# Patient Record
Sex: Male | Born: 1935 | Race: Black or African American | Hispanic: No | Marital: Married | Smoking: Former smoker
Health system: Southern US, Community
[De-identification: ages and names within clinical notes are randomized; demographics above are authoritative.]

## PROBLEM LIST (undated history)

## (undated) DIAGNOSIS — I714 Abdominal aortic aneurysm, without rupture, unspecified: Secondary | ICD-10-CM

## (undated) DIAGNOSIS — I10 Essential (primary) hypertension: Secondary | ICD-10-CM

## (undated) DIAGNOSIS — I639 Cerebral infarction, unspecified: Secondary | ICD-10-CM

## (undated) HISTORY — PX: CATARACT EXTRACTION, BILATERAL: SHX1313

## (undated) HISTORY — PX: DENTAL SURGERY: SHX609

---

## 2015-01-30 HISTORY — PX: REFRACTIVE SURGERY: SHX103

## 2017-11-30 LAB — BASIC METABOLIC PANEL
BUN: 26 — AB (ref 4–21)
Creatinine: 1.4 — AB (ref 0.6–1.3)
Glucose: 83
Sodium: 142 (ref 137–147)

## 2017-11-30 LAB — LIPID PANEL
Cholesterol: 142 (ref 0–200)
HDL: 50 (ref 35–70)
LDL Cholesterol: 82
Triglycerides: 35 — AB (ref 40–160)

## 2017-11-30 LAB — HEPATIC FUNCTION PANEL: Bilirubin, Total: 0.7

## 2017-11-30 LAB — HEMOGLOBIN A1C: Hemoglobin A1C: 5.7

## 2018-08-02 ENCOUNTER — Emergency Department (HOSPITAL_COMMUNITY): Payer: Medicare Other

## 2018-08-02 ENCOUNTER — Other Ambulatory Visit: Payer: Self-pay

## 2018-08-02 ENCOUNTER — Encounter (HOSPITAL_COMMUNITY): Payer: Self-pay

## 2018-08-02 ENCOUNTER — Emergency Department (HOSPITAL_COMMUNITY)
Admission: EM | Admit: 2018-08-02 | Discharge: 2018-08-02 | Disposition: A | Discharge status: Home / Self Care | Payer: Medicare Other | Attending: Emergency Medicine | Admitting: Emergency Medicine

## 2018-08-02 DIAGNOSIS — R5381 Other malaise: Secondary | ICD-10-CM | POA: Diagnosis not present

## 2018-08-02 DIAGNOSIS — Z8673 Personal history of transient ischemic attack (TIA), and cerebral infarction without residual deficits: Secondary | ICD-10-CM | POA: Insufficient documentation

## 2018-08-02 DIAGNOSIS — Z7901 Long term (current) use of anticoagulants: Secondary | ICD-10-CM | POA: Diagnosis not present

## 2018-08-02 DIAGNOSIS — R0602 Shortness of breath: Secondary | ICD-10-CM | POA: Diagnosis present

## 2018-08-02 DIAGNOSIS — I10 Essential (primary) hypertension: Secondary | ICD-10-CM | POA: Insufficient documentation

## 2018-08-02 DIAGNOSIS — Z79899 Other long term (current) drug therapy: Secondary | ICD-10-CM | POA: Diagnosis not present

## 2018-08-02 HISTORY — DX: Essential (primary) hypertension: I10

## 2018-08-02 HISTORY — DX: Abdominal aortic aneurysm, without rupture: I71.4

## 2018-08-02 HISTORY — DX: Abdominal aortic aneurysm, without rupture, unspecified: I71.40

## 2018-08-02 HISTORY — DX: Cerebral infarction, unspecified: I63.9

## 2018-08-02 LAB — BLOOD GAS, VENOUS
Acid-Base Excess: 2.5 mmol/L — ABNORMAL HIGH (ref 0.0–2.0)
Bicarbonate: 27.7 mmol/L (ref 20.0–28.0)
O2 SAT: 64.7 %
Patient temperature: 98.6
pCO2, Ven: 47.1 mmHg (ref 44.0–60.0)
pH, Ven: 7.387 (ref 7.250–7.430)
pO2, Ven: 35.8 mmHg (ref 32.0–45.0)

## 2018-08-02 LAB — COMPREHENSIVE METABOLIC PANEL
ALT: 11 U/L (ref 0–44)
ANION GAP: 8 (ref 5–15)
AST: 18 U/L (ref 15–41)
Albumin: 3.8 g/dL (ref 3.5–5.0)
Alkaline Phosphatase: 58 U/L (ref 38–126)
BUN: 13 mg/dL (ref 8–23)
CALCIUM: 8.9 mg/dL (ref 8.9–10.3)
CO2: 28 mmol/L (ref 22–32)
Chloride: 106 mmol/L (ref 98–111)
Creatinine, Ser: 1.18 mg/dL (ref 0.61–1.24)
GFR calc Af Amer: >60 mL/min (ref >60)
GFR calc non Af Amer: 58 mL/min — ABNORMAL LOW (ref >60)
Glucose, Bld: 90 mg/dL (ref 70–99)
Potassium: 3.9 mmol/L (ref 3.5–5.1)
Sodium: 142 mmol/L (ref 135–145)
Total Bilirubin: 1.6 mg/dL — ABNORMAL HIGH (ref 0.3–1.2)
Total Protein: 7.6 g/dL (ref 6.5–8.1)

## 2018-08-02 LAB — CBC WITH DIFFERENTIAL/PLATELET
Abs Immature Granulocytes: 0.01 10*3/uL (ref 0.00–0.07)
Basophils Absolute: 0 10*3/uL (ref 0.0–0.1)
Basophils Relative: 1 %
EOS ABS: 0.1 10*3/uL (ref 0.0–0.5)
Eosinophils Relative: 2 %
HCT: 46 % (ref 39.0–52.0)
Hemoglobin: 14.4 g/dL (ref 13.0–17.0)
Immature Granulocytes: 0 %
Lymphocytes Relative: 29 %
Lymphs Abs: 1.8 10*3/uL (ref 0.7–4.0)
MCH: 31 pg (ref 26.0–34.0)
MCHC: 31.3 g/dL (ref 30.0–36.0)
MCV: 99.1 fL (ref 80.0–100.0)
Monocytes Absolute: 0.5 10*3/uL (ref 0.1–1.0)
Monocytes Relative: 9 %
NEUTROS PCT: 59 %
Neutro Abs: 3.6 10*3/uL (ref 1.7–7.7)
Platelets: 127 10*3/uL — ABNORMAL LOW (ref 150–400)
RBC: 4.64 MIL/uL (ref 4.22–5.81)
RDW: 14.3 % (ref 11.5–15.5)
WBC: 6.1 10*3/uL (ref 4.0–10.5)
nRBC: 0 % (ref 0.0–0.2)

## 2018-08-02 LAB — URINALYSIS, ROUTINE W REFLEX MICROSCOPIC
Bilirubin Urine: NEGATIVE
Glucose, UA: NEGATIVE mg/dL
Hgb urine dipstick: NEGATIVE
Ketones, ur: NEGATIVE mg/dL
Leukocytes, UA: NEGATIVE
Nitrite: NEGATIVE
Protein, ur: NEGATIVE mg/dL
Specific Gravity, Urine: 1.011 (ref 1.005–1.030)
pH: 8 (ref 5.0–8.0)

## 2018-08-02 MED ORDER — LORAZEPAM 0.5 MG PO TABS: 0.5000 mg | ORAL_TABLET | Freq: Once | ORAL | Status: DC

## 2018-08-02 NOTE — ED Provider Notes (Signed)
[facility]-EMERGENCY DEPT Provider Note   CSN: [id number] Arrival date & time: 08/02/18  1028     History   Chief Complaint No chief complaint on file.   HPI Steven Kent is a 84 y.o. male.  HPI   He presents for evaluation of shortness of breath which was first noticed this morning.  He also felt like he had more saliva in his mouth than usual.  He is relatively new to [place] does not have a local doctor here.  He has had chronic ongoing excess saliva since his stroke many years ago.  He does not have a swallowing disorder that he knows about.  He intermittently uses a patch on his right eye because he has blurred vision there, following the stroke.  There is been no recent fever, cough, change in chronic right-sided weakness or new paresthesia.  He is here with family members.  They state that he has early dementia.  There are no other known modifying factors.  Past Medical History:  Diagnosis Date  . Abdominal aortic aneurysm (AAA) (HCC)   . Hypertension   . Stroke [facility](HCC)     There are no active problems to display for this patient.   History reviewed. No pertinent surgical history.      Home Medications    Prior to Admission medications   Medication Sig Start Date End Date Taking? Authorizing Provider  allopurinol (ZYLOPRIM) 100 MG tablet Take 100 mg by mouth 2 (two) times daily.   Yes [provider]  amLODipine (NORVASC) 2.5 MG tablet Take 2.5 mg by mouth every evening.   Yes [provider]  atorvastatin (LIPITOR) 40 MG tablet Take 40 mg by mouth every evening.   Yes [provider]  brimonidine (ALPHAGAN) 0.15 % ophthalmic solution Place 1 drop into both eyes daily.   Yes [provider]  dorzolamide (TRUSOPT) 2 % ophthalmic solution Place 1 drop into both eyes 2 (two) times daily.   Yes [provider]  famotidine (PEPCID) 20 MG tablet Take 20 mg by mouth daily.   Yes [provider]   gabapentin (NEURONTIN) 400 MG capsule Take 400 mg by mouth 2 (two) times daily.   Yes [provider]  latanoprost (XALATAN) 0.005 % ophthalmic solution Place 1 drop into both eyes at bedtime.   Yes [provider]  oxybutynin (DITROPAN) 5 MG tablet Take 5 mg by mouth 2 (two) times daily.   Yes [provider]  rivaroxaban (XARELTO) 20 MG TABS tablet Take 20 mg by mouth every evening.   Yes [provider]  torsemide (DEMADEX) 5 MG tablet Take 5 mg by mouth every evening.   Yes [provider]  Vitamin D, Ergocalciferol, (DRISDOL) 1.25 MG (50000 UT) CAPS capsule Take 50,000 Units by mouth every 7 (seven) days.   Yes [provider]    Family History History reviewed. No pertinent family history.  Social History Social History   Tobacco Use  . Smoking status: Not on file  Substance Use Topics  . Alcohol use: Never    Frequency: Never  . Drug use: Never     Allergies   Aspirin   Review of Systems Review of Systems  All other systems reviewed and are negative.    Physical Exam Updated Vital Signs BP (!) 143/83   Pulse 62   Temp 98.8 F (37.1 C) (Oral)   Resp 11   SpO2 99%   Physical Exam Vitals signs and nursing  note reviewed.  Constitutional:      General: He is not in acute distress.    Appearance: He is well-developed. He is not ill-appearing or diaphoretic.     Comments: Elderly, frail  HENT:     Head: Normocephalic and atraumatic.     Right Ear: External ear normal.     Left Ear: External ear normal.     Nose: Nose normal. No congestion or rhinorrhea.     Mouth/Throat:     Mouth: Mucous membranes are moist.     Pharynx: No oropharyngeal exudate or posterior oropharyngeal erythema.  Eyes:     Conjunctiva/sclera: Conjunctivae normal.     Pupils: Pupils are equal, round, and reactive to light.  Neck:     Musculoskeletal: Normal range of motion and neck supple.     Trachea: Phonation normal.    Cardiovascular:     Rate and Rhythm: Normal rate and regular rhythm.     Heart sounds: Normal heart sounds.  Pulmonary:     Effort: Pulmonary effort is normal. No respiratory distress.     Breath sounds: Normal breath sounds. No stridor. No rhonchi.  Abdominal:     General: There is no distension.     Palpations: Abdomen is soft.     Tenderness: There is no abdominal tenderness.     Hernia: No hernia is present.  Musculoskeletal:     Comments: Right arm and leg weakness, mild.  Right lower leg edema, from chronic disuse.  Family members state that this is unchanged.  Skin:    General: Skin is warm and dry.     Coloration: Skin is not pale.     Findings: No erythema.  Neurological:     Mental Status: He is alert and oriented to person, place, and time.     Cranial Nerves: No cranial nerve deficit.     Sensory: No sensory deficit.     Motor: No abnormal muscle tone.     Coordination: Coordination normal.     Comments: No dysarthria or aphasia.  Psychiatric:        Behavior: Behavior normal.        Thought Content: Thought content normal.        Judgment: Judgment normal.      ED Treatments / Results  Labs (all labs ordered are listed, but only abnormal results are displayed) Labs Reviewed  COMPREHENSIVE METABOLIC PANEL - Abnormal; Notable for the following components:      Result Value   Total Bilirubin 1.6 (*)    GFR calc non Af Amer 58 (*)    All other components within normal limits  CBC WITH DIFFERENTIAL/PLATELET - Abnormal; Notable for the following components:   Platelets 127 (*)    All other components within normal limits  URINALYSIS, ROUTINE W REFLEX MICROSCOPIC - Abnormal; Notable for the following components:   APPearance HAZY (*)    All other components within normal limits  BLOOD GAS, VENOUS - Abnormal; Notable for the following components:   Acid-Base Excess 2.5 (*)    All other components within normal limits    EKG EKG  Interpretation  Date/Time:  Thursday August 02 2018 12:42:18 EST Ventricular Rate:  71 PR Interval:    QRS Duration: 104 QT Interval:  440 QTC Calculation: 479 R Axis:   -23 Text Interpretation:  Sinus rhythm Atrial premature complexes Prolonged PR interval Borderline left axis deviation Borderline T wave abnormalities Borderline prolonged QT interval Artifact No previous ECGs available Confirmed by  Mancel BaleWentz, Meilani Edmundson [phone]([id number]) on 08/02/2018 2:23:37 PM   Radiology Dg Chest 2 View  Result Date: 08/02/2018 CLINICAL DATA:  Shortness of Breath EXAM: CHEST - 2 VIEW COMPARISON:  None. FINDINGS: Cardiomegaly. No confluent airspace opacities or effusions. No acute bony abnormality. IMPRESSION: Cardiomegaly.  No active disease. Electronically Signed   By: Charlett NoseKevin  Dover M.D.   On: 08/02/2018 12:25    Procedures Procedures (including critical care time)  Medications Ordered in ED Medications - No data to display   Initial Impression / Assessment and Plan / ED Course  I have reviewed the triage vital signs and the nursing notes.  Pertinent labs & imaging results that were available during my care of the patient were reviewed by me and considered in my medical decision making (see chart for details).  Clinical Course as of Aug 02 1437  Thu Aug 02, 2018  1421 Normal  Urinalysis, Routine w reflex microscopic(!) [EW]  1422 Normal except platelets low  CBC with Differential(!) [EW]  1422 Normal  Comprehensive metabolic panel(!) [EW]  1422 Normal  Blood gas, venous(!) [EW]  1423 No infiltrate or CHF images reviewed by me   [EW]    Clinical Course User Index [EW] Mancel BaleWentz, Julius Boniface, MD     Patient Vitals for the past 24 hrs:  BP Temp Temp src Pulse Resp SpO2  08/02/18 1330 (!) 143/83 - - 62 11 99 %  08/02/18 1300 (!) 147/88 - - 62 16 100 %  08/02/18 1200 (!) 143/81 - - (!) 53 - 100 %  08/02/18 1100 130/82 - - (!) 56 - 100 %  08/02/18 1058 (!) 150/82 98.8 F (37.1 C) Oral 63 18 100 %    2:25  PM Reevaluation with update and discussion. After initial assessment and treatment, an updated evaluation reveals no change in clinical status, he remains comfortable and alert.  Findings discussed with the patient and his daughter, all questions answered. Mancel BaleElliott Shanikia [employer]   Medical Decision Making: Nonspecific symptoms, with chronic illnesses.  Screening evaluation is reassuring.  Doubt serious bacterial infection, metabolic instability, pneumonia, ACS or PE.  CRITICAL CARE-no Performed by: Mancel BaleElliott Venita Seng  Nursing Notes Reviewed/ Care Coordinated Applicable Imaging Reviewed Interpretation of Laboratory Data incorporated into ED treatment  The patient appears reasonably screened and/or stabilized for discharge and I doubt any other medical condition or other [facility] requiring further screening, evaluation, or treatment in the ED at this time prior to discharge.  Plan: Home Medications-continue usual medications; Home Treatments-rest, fluids; return here if the recommended treatment, does not improve the symptoms; Recommended follow up-PCP of choice for follow-up care as needed   Final Clinical Impressions(s) / ED Diagnoses   [facility]    ED Discharge Orders    None       Mancel BaleWentz, Lenox Bink, MD 08/02/18 1439

## 2018-08-02 NOTE — ED Triage Notes (Addendum)
Patient BIB EMS from home with complaints of "I dont feel good." EMS states patient does not specify what does not feel good. Patient reports normal PO intake. Denies nausea/vomiting/diarrhea/CP/weakness/SOB. Patient reports excessive saliva. Patient VSS. Patient has history of stroke with residual right sided weakness. Patient's wife told EMS that she thought "he has some sort of aneurysm in his belly." EMS VS: 150/88, 70 SR, 16 RR, 98% RA, CBG=101

## 2018-08-02 NOTE — ED Notes (Signed)
Bed: [id number] Expected date:  Expected time:  Means of arrival:  Comments: EMS-weak/excessivw salvia

## 2018-08-02 NOTE — Discharge Instructions (Addendum)
Make sure that he is eating and drinking regularly, and taking all of his medicines.  Use the resource guide to help you find a doctor to see for ongoing management, prescriptions and interventions as needed.

## 2018-08-02 NOTE — ED Triage Notes (Signed)
Per EMS, patient takes Xarelto, Gabapentin, Famotidine, Atorvastatin, Amlodipine, Allopurinol, Latanoprost. Patient wife to be bringing list to the ED.

## 2018-08-16 ENCOUNTER — Encounter: Payer: Self-pay | Admitting: Family Medicine

## 2018-08-16 ENCOUNTER — Ambulatory Visit (INDEPENDENT_AMBULATORY_CARE_PROVIDER_SITE_OTHER): Payer: Medicare Other | Admitting: Family Medicine

## 2018-08-16 VITALS — BP 134/84 | HR 62 | Temp 98.6°F | Ht 73.5 in | Wt 194.8 lb

## 2018-08-16 DIAGNOSIS — B351 Tinea unguium: Secondary | ICD-10-CM

## 2018-08-16 DIAGNOSIS — I714 Abdominal aortic aneurysm, without rupture, unspecified: Secondary | ICD-10-CM | POA: Insufficient documentation

## 2018-08-16 DIAGNOSIS — E785 Hyperlipidemia, unspecified: Secondary | ICD-10-CM | POA: Insufficient documentation

## 2018-08-16 DIAGNOSIS — I2510 Atherosclerotic heart disease of native coronary artery without angina pectoris: Secondary | ICD-10-CM | POA: Insufficient documentation

## 2018-08-16 DIAGNOSIS — R600 Localized edema: Secondary | ICD-10-CM

## 2018-08-16 DIAGNOSIS — K219 Gastro-esophageal reflux disease without esophagitis: Secondary | ICD-10-CM

## 2018-08-16 DIAGNOSIS — M109 Gout, unspecified: Secondary | ICD-10-CM | POA: Diagnosis not present

## 2018-08-16 DIAGNOSIS — H409 Unspecified glaucoma: Secondary | ICD-10-CM

## 2018-08-16 DIAGNOSIS — I8290 Acute embolism and thrombosis of unspecified vein: Secondary | ICD-10-CM | POA: Insufficient documentation

## 2018-08-16 DIAGNOSIS — I10 Essential (primary) hypertension: Secondary | ICD-10-CM | POA: Diagnosis not present

## 2018-08-16 DIAGNOSIS — I129 Hypertensive chronic kidney disease with stage 1 through stage 4 chronic kidney disease, or unspecified chronic kidney disease: Secondary | ICD-10-CM | POA: Insufficient documentation

## 2018-08-16 DIAGNOSIS — I69351 Hemiplegia and hemiparesis following cerebral infarction affecting right dominant side: Secondary | ICD-10-CM

## 2018-08-16 DIAGNOSIS — N183 Chronic kidney disease, stage 3 unspecified: Secondary | ICD-10-CM

## 2018-08-16 DIAGNOSIS — G629 Polyneuropathy, unspecified: Secondary | ICD-10-CM | POA: Insufficient documentation

## 2018-08-16 DIAGNOSIS — G6289 Other specified polyneuropathies: Secondary | ICD-10-CM

## 2018-08-16 DIAGNOSIS — N3281 Overactive bladder: Secondary | ICD-10-CM

## 2018-08-16 MED ORDER — GABAPENTIN 400 MG PO CAPS: 400.0000 mg | ORAL_CAPSULE | Freq: Two times a day (BID) | ORAL | 3 refills | Status: DC

## 2018-08-16 MED ORDER — AMLODIPINE BESYLATE 2.5 MG PO TABS: 2.5000 mg | ORAL_TABLET | Freq: Every evening | ORAL | 3 refills | Status: DC

## 2018-08-16 MED ORDER — ATORVASTATIN CALCIUM 40 MG PO TABS: 40.0000 mg | ORAL_TABLET | Freq: Every evening | ORAL | 3 refills | Status: DC

## 2018-08-16 MED ORDER — OXYBUTYNIN CHLORIDE 5 MG PO TABS: 5.0000 mg | ORAL_TABLET | Freq: Two times a day (BID) | ORAL | 3 refills | Status: DC

## 2018-08-16 MED ORDER — TORSEMIDE 5 MG PO TABS: 5.0000 mg | ORAL_TABLET | Freq: Every evening | ORAL | 3 refills | Status: DC

## 2018-08-16 MED ORDER — ALLOPURINOL 100 MG PO TABS: 100.0000 mg | ORAL_TABLET | Freq: Two times a day (BID) | ORAL | 3 refills | Status: DC

## 2018-08-16 MED ORDER — RIVAROXABAN 20 MG PO TABS: 20.0000 mg | ORAL_TABLET | Freq: Every evening | ORAL | 3 refills | Status: DC

## 2018-08-16 NOTE — Assessment & Plan Note (Signed)
Referral placed ophthalmology 

## 2018-08-16 NOTE — Patient Instructions (Addendum)
It was very nice to see you today!  No medication changes today.  We will place referrals to vascular, cardiology, podiatry, nephrology, and ophthalmology.  I will refill your medications.  We will get your records.  Please come back to see me in a few months for your physical with blood work.  Take care, Dr [name]

## 2018-08-16 NOTE — Assessment & Plan Note (Signed)
Stable.  Continue Pepcid 20mg daily

## 2018-08-16 NOTE — Assessment & Plan Note (Signed)
Referral placed cardiology.

## 2018-08-16 NOTE — Assessment & Plan Note (Signed)
Continue Lipitor 40 mg daily.  He will follow-up soon for CPE with blood work.  Will obtain records from previous PCP.

## 2018-08-16 NOTE — Assessment & Plan Note (Signed)
Stable.  Continue allopurinol 100 mg twice daily.  Obtain records from previous PCP.  Consider stopping the future as he has not had a gout flare that he can remember.

## 2018-08-16 NOTE — Assessment & Plan Note (Signed)
Stable.  Continue Xarelto 20mg daily

## 2018-08-16 NOTE — Assessment & Plan Note (Addendum)
Secondary to history of CVA. Continue gabapentin 400 mg twice daily.

## 2018-08-16 NOTE — Assessment & Plan Note (Signed)
Continue oxybutynin 5 mg twice daily.

## 2018-08-16 NOTE — Progress Notes (Addendum)
Subjective:  Steven Kent is a 84 y.o. male who presents today with a chief complaint of AAA and to establish care.  HPI:  Patient recently located to [place] from near [place].  He requests refill on his chronic medications and referral to specialists that he was seeing in [place].  He has no acute complaints today.  His stable, chronic medical conditions are outlined below:   # Gout - On allopurinol 100mg  bid and tolerating well.  # HTN - On norvasc 2.5mg  daily and tolerating well.  # AAA -Diagnosed while in [place].  Has followed with vascular surgery.  Needs a referral to see a new vascular surgeon in [place].  # History of recurrent VTE - Currently on Xarelto 20 mg daily.  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  #Glaucoma -Follows with ophthalmology.  Needs referral to see a new ophthalmologist in [name].  # CKD3 -Needs referral for new nephrologist in [place].  # Onychomycosis -Several year history.  Needs referral for podiatrist in [place].  # Chronic LE Edema - Takes torsemide 5mg  every evening  # GERD - On pepcid 20mg  daily and tolerating well.  # HLD - On lipitor 40mg  daily and tolerating well.  # Overactive Bladder - On oxybutynin 5mg  bid and tolerating well.  # History of Stroke in 2007 with residual right side weakness -Symptoms are stable.  Able perform ADLs independently.  ROS: Per HPI, otherwise a complete review of systems was negative.   PMH:  The following were reviewed and entered/updated in epic: Past Medical History:  Diagnosis Date  . Abdominal aortic aneurysm (AAA) (HCC)   . Hypertension   . Stroke [facility])    Patient Active Problem List   Diagnosis Date Noted  . Gout 08/16/2018  . Essential hypertension 08/16/2018  . Hypertensive kidney disease with chronic kidney disease stage III (HCC) 08/16/2018  . VTE (venous thromboembolism) 08/16/2018  . Peripheral  neuropathy 08/16/2018  . Glaucoma 08/16/2018  . Onychomycosis 08/16/2018  . Leg edema 08/16/2018  . Gastroesophageal reflux disease 08/16/2018  . Hyperlipidemia 08/16/2018  . Overactive bladder 08/16/2018  . Coronary artery disease involving native heart without angina pectoris 08/16/2018  . Hemiparesis affecting right side as late effect of cerebrovascular accident (HCC)   . Abdominal aortic aneurysm (AAA) (HCC)    History reviewed. No pertinent surgical history.  Family History  Problem Relation Age of Onset  . Prostate cancer Neg Hx     Medications- reviewed and updated Current Outpatient Medications  Medication Sig Dispense Refill  . allopurinol (ZYLOPRIM) 100 MG tablet Take 1 tablet (100 mg total) by mouth 2 (two) times daily. 180 tablet 3  . amLODipine (NORVASC) 2.5 MG tablet Take 1 tablet (2.5 mg total) by mouth every evening. 90 tablet 3  . atorvastatin (LIPITOR) 40 MG tablet Take 1 tablet (40 mg total) by mouth every evening. 90 tablet 3  . brimonidine (ALPHAGAN) 0.15 % ophthalmic solution Place 1 drop into both eyes daily.    . dorzolamide (TRUSOPT) 2 % ophthalmic solution Place 1 drop into both eyes 2 (two) times daily.    Marland Kitchen gabapentin (NEURONTIN) 400 MG capsule Take 1 capsule (400 mg total) by mouth 2 (two) times daily. 180 capsule 3  . latanoprost (XALATAN) 0.005 % ophthalmic solution Place 1 drop into both eyes at bedtime.    Marland Kitchen oxybutynin (DITROPAN) 5 MG tablet Take 1 tablet (5 mg total) by mouth 2 (two) times daily.  180 tablet 3  . rivaroxaban (XARELTO) 20 MG TABS tablet Take 1 tablet (20 mg total) by mouth every evening. 90 tablet 3  . torsemide (DEMADEX) 5 MG tablet Take 1 tablet (5 mg total) by mouth every evening. 90 tablet 3  . famotidine (PEPCID) 20 MG tablet Take 1 tablet (20 mg total) by mouth daily. 60 tablet 11  . Vitamin D, Ergocalciferol, (DRISDOL) 1.25 MG (50000 UT) CAPS capsule Take 1 capsule (50,000 Units total) by mouth every 7 (seven) days. 12 capsule 3    No current facility-administered medications for this visit.     Allergies-reviewed and updated Allergies  Allergen Reactions  . Aspirin     Full Strength Asprin-325 mg causes upset stomach. Can take 81 mg.    Social History   Socioeconomic History  . Marital status: Married    Spouse name: Not on file  . Number of children: Not on file  . Years of education: Not on file  . Highest education level: Not on file  Occupational History  . Not on file  Social Needs  . Financial resource strain: Not on file  . Food insecurity:    Worry: Not on file    Inability: Not on file  . Transportation needs:    Medical: Not on file    Non-medical: Not on file  Tobacco Use  . Smoking status: Former Games developer  . Smokeless tobacco: Never Used  Substance and Sexual Activity  . Alcohol use: Never    Frequency: Never  . Drug use: Never  . Sexual activity: Not on file  Lifestyle  . Physical activity:    Days per week: Not on file    Minutes per session: Not on file  . Stress: Not on file  Relationships  . Social connections:    Talks on phone: Not on file    Gets together: Not on file    Attends religious service: Not on file    Active member of club or organization: Not on file    Attends meetings of clubs or organizations: Not on file    Relationship status: Not on file  Other Topics Concern  . Not on file  Social History Narrative  . Not on file     Objective:  Physical Exam: BP 134/84 (BP Location: Left Arm, Patient Position: Sitting, Cuff Size: Normal)   Pulse 62   Temp 98.6 F (37 C) (Oral)   Ht 6' 1.5" (1.867 m)   Wt 194 lb 12.8 oz (88.4 kg)   SpO2 96%   BMI 25.35 kg/m   Gen: NAD, resting comfortably CV: RRR with no murmurs appreciated Pulm: NWOB, CTAB with no crackles, wheezes, or rhonchi GI: Normal bowel sounds present. Soft, Nontender, Nondistended. MSK: No edema, cyanosis, or clubbing noted Skin: Warm, dry Neuro: Residual right-sided weakness, otherwise  grossly normal neuro exam. Psych: Normal affect and thought content  Assessment/Plan:  VTE (venous thromboembolism) Stable.  Continue Xarelto 20 mg daily.  Overactive bladder Continue oxybutynin 5 mg twice daily.  Peripheral neuropathy Secondary to history of CVA. Continue gabapentin 400 mg twice daily.  Onychomycosis Referral placed to podiatry.  Leg edema Stable.  Continue torsemide 5 mg daily.  Hyperlipidemia Continue Lipitor 40 mg daily.  He will follow-up soon for CPE with blood work.  Will obtain records from previous PCP.  Gout Stable.  Continue allopurinol 100 mg twice daily.  Obtain records from previous PCP.  Consider stopping the future as he has not had a gout  flare that he can remember.  Glaucoma Referral placed ophthalmology.  Gastroesophageal reflux disease Stable.  Continue Pepcid 20 mg daily.  Essential hypertension At goal.  Continue Norvasc 2.5 mg daily.  Coronary artery disease involving native heart without angina pectoris Referral placed cardiology.  Hypertensive kidney disease with chronic kidney disease stage III (HCC) BMET while in emergency department a few days ago was stable.  Will place referral to nephrology.    Abdominal aortic aneurysm (AAA) (HCC) Refer to vascular surgery.  Preventative Healthcare Patient was instructed to return soon for CPE.  Will obtain records from previous PCP.  He thinks that he has had a colonoscopy within the last 10 years.  He is interested in possibly having PSA screening done. Health Maintenance Due  Topic Date Due  . TETANUS/TDAP  07/24/1954   Time Spent: I spent >60 minutes face-to-face with the patient, with more than half spent on coordinating care and counseling for management plan for his VTE, overactive bladder, peripheral neuropathy, onychomycosis, leg edema, hyperlipidemia, gout, glaucoma, GERD, hypertension, coronary artery disease, CKD, stroke, and AAA.  Katina Degreealeb M. Jimmey RalphParker, MD 08/22/2018 9:30 AM

## 2018-08-16 NOTE — Assessment & Plan Note (Signed)
At goal.  Continue Norvasc 2.5 mg daily. 

## 2018-08-16 NOTE — Assessment & Plan Note (Signed)
Stable.  Continue torsemide 5 mg daily.

## 2018-08-16 NOTE — Assessment & Plan Note (Signed)
BMET while in emergency department a few days ago was stable.  Will place referral to nephrology.

## 2018-08-16 NOTE — Assessment & Plan Note (Signed)
Referral placed to podiatry.

## 2018-08-16 NOTE — Assessment & Plan Note (Signed)
Refer to vascular surgery. 

## 2018-08-20 ENCOUNTER — Other Ambulatory Visit: Payer: Self-pay | Admitting: Family Medicine

## 2018-08-20 MED ORDER — FAMOTIDINE 20 MG PO TABS: 20.0000 mg | ORAL_TABLET | Freq: Every day | ORAL | 11 refills | Status: DC

## 2018-08-20 MED ORDER — VITAMIN D (ERGOCALCIFEROL) 1.25 MG (50000 UNIT) PO CAPS: 50000.0000 [IU] | ORAL_CAPSULE | ORAL | 3 refills | Status: DC

## 2018-08-20 NOTE — Telephone Encounter (Signed)
See note

## 2018-08-20 NOTE — Telephone Encounter (Signed)
Requested medication (s) are due for refill today: yes  Requested medication (s) are on the active medication list: yes  Last refill:  Last refilled by historical provider  Future visit scheduled: yes  Notes to clinic:  Unable to refill per protocol. Medication last filled by historical provider    Requested Prescriptions  Pending Prescriptions Disp Refills   famotidine (PEPCID) 20 MG tablet      Sig: Take 1 tablet (20 mg total) by mouth daily.     Gastroenterology:  H2 Antagonists Passed - 08/20/2018 11:09 AM      Passed - Valid encounter within last 12 months    Recent Outpatient Visits          4 days ago Gout, unspecified cause, unspecified chronicity, unspecified site   [facility], Katina Degree, MD      Future Appointments            In 2 months [name], Katina Degree, MD [facility] [place]          Vitamin D, Ergocalciferol, (DRISDOL) 1.25 MG (50000 UT) CAPS capsule      Sig: Take 1 capsule (50,000 Units total) by mouth every 7 (seven) days.     Endocrinology:  Vitamins - Vitamin D Supplementation Failed - 08/20/2018 11:09 AM      Failed - 50,000 IU strengths are not delegated      Failed - Phosphate in normal range and within 360 days    No results found for: PHOS       Failed - Vit D in normal range and within 360 days    No results found for: [id number], PO2423NT6, [id number], 25OHVITD3, 25OHVITD2, 25OHVITD3, 25OHVITD2, 25OHVITD1, 25OHVITD2, 25OHVITD3, VD25OH       Passed - Ca in normal range and within 360 days    Calcium  Date Value Ref Range Status  08/02/2018 8.9 8.9 - 10.3 mg/dL Final         Passed - Valid encounter within last 12 months    Recent Outpatient Visits          4 days ago Gout, unspecified cause, unspecified chronicity, unspecified site   [facility], Katina Degree, MD      Future Appointments            In 2 months [name], Katina Degree, MD [facility]  [place], [facility]

## 2018-08-20 NOTE — Telephone Encounter (Signed)
Copied from CRM [id number]. Topic: Quick Communication - See Telephone Encounter >> Aug 20, 2018 11:01 AM Windy Kalata, NT wrote: CRM for notification. See Telephone encounter for: 08/20/18.  Patient wife is calling and states the patient is needing refills on the following medications. Please advise.  famotidine (PEPCID) 20 MG tablet Vitamin D3 50,000 IU capsules  [id number] [place], [place] - [address] AT [facility] [address] [facility] [address] [place] [id number] Phone: [phone] Fax: [phone]

## 2018-08-20 NOTE — Telephone Encounter (Signed)
New Patient requesting refills.

## 2018-08-21 ENCOUNTER — Telehealth: Payer: Self-pay

## 2018-08-21 NOTE — Telephone Encounter (Signed)
Patient wife called wants to know if patient has to take Pepcid,unsure if this Rx is something that he needs to take. Please advise.

## 2018-08-22 NOTE — Telephone Encounter (Signed)
Cancel Call.

## 2018-09-07 ENCOUNTER — Other Ambulatory Visit: Payer: Self-pay

## 2018-09-07 DIAGNOSIS — I714 Abdominal aortic aneurysm, without rupture, unspecified: Secondary | ICD-10-CM

## 2018-09-18 ENCOUNTER — Encounter: Payer: Self-pay | Admitting: Family Medicine

## 2018-09-19 ENCOUNTER — Encounter: Payer: Self-pay | Admitting: Podiatry

## 2018-09-19 ENCOUNTER — Ambulatory Visit (INDEPENDENT_AMBULATORY_CARE_PROVIDER_SITE_OTHER): Payer: Medicare Other | Admitting: Podiatry

## 2018-09-19 VITALS — BP 153/96 | HR 66

## 2018-09-19 DIAGNOSIS — M79674 Pain in right toe(s): Secondary | ICD-10-CM | POA: Diagnosis not present

## 2018-09-19 DIAGNOSIS — G629 Polyneuropathy, unspecified: Secondary | ICD-10-CM

## 2018-09-19 DIAGNOSIS — B351 Tinea unguium: Secondary | ICD-10-CM | POA: Diagnosis not present

## 2018-09-19 DIAGNOSIS — M79675 Pain in left toe(s): Secondary | ICD-10-CM

## 2018-09-19 NOTE — Progress Notes (Signed)
This patient presents to the office with chief complaint of long thick nails.  Patient presents the office with his wife for evaluation and treatment of his feet.  He presents the office with his wife Steven Kent.  He has a history of right-sided stroke and right-sided weakness.  Patient states he is just moved from  [place] to the [place].  Patient is presently diagnosed with peripheral neuropathy for which he takes gabapentin.  He also is taking xarelto.  He presents the office for preventative foot care services.  General Appearance  Alert, conversant and in no acute stress.  Vascular  Dorsalis pedis and posterior tibial  pulses are palpable  Left foot.  Dorsalis pedis and posterior tibial pulses are absent right foot..  Capillary return is within normal limits  bilaterally. Temperature is within normal limits  bilaterally.  Neurologic  Senn-Weinstein monofilament wire test absent   bilaterally. Muscle power within normal limits left foot but decreased right foot..  Nails Thick disfigured discolored nails with subungual debris  from hallux to fifth toes bilaterally. No evidence of bacterial infection or drainage bilaterally.  Orthopedic  No limitations of motion  feet .  No crepitus or effusions noted.  No bony pathology or digital deformities noted.  Skin  normotropic skin with no porokeratosis noted bilaterally.  No signs of infections or ulcers noted.    Onychomycosis  B/L  Peripheral neuropathy  IE.  Debridement of nails  X 10.  Discussed his foot findings with this patient.  He was noted to have decreased motion and circulatory findings in his right foot.  Patient has normal motion and normal circulation in his left foot.  LOPS was found to be absent.  RTC 10 weeks for preventative foot care services.   Steven Kent DPM

## 2018-09-27 ENCOUNTER — Ambulatory Visit: Payer: Medicare Other | Admitting: Cardiovascular Disease

## 2018-10-02 ENCOUNTER — Ambulatory Visit (HOSPITAL_COMMUNITY)
Admission: RE | Admit: 2018-10-02 | Discharge: 2018-10-02 | Disposition: A | Discharge status: Home / Self Care | Payer: Medicare Other | Source: Ambulatory Visit | Attending: Vascular Surgery | Admitting: Vascular Surgery

## 2018-10-02 ENCOUNTER — Ambulatory Visit (INDEPENDENT_AMBULATORY_CARE_PROVIDER_SITE_OTHER): Payer: Medicare Other | Admitting: Vascular Surgery

## 2018-10-02 ENCOUNTER — Other Ambulatory Visit: Payer: Self-pay

## 2018-10-02 ENCOUNTER — Encounter: Payer: Self-pay | Admitting: Vascular Surgery

## 2018-10-02 VITALS — BP 118/71 | HR 59 | Temp 98.9°F | Resp 16 | Ht 73.5 in | Wt 208.0 lb

## 2018-10-02 DIAGNOSIS — I714 Abdominal aortic aneurysm, without rupture, unspecified: Secondary | ICD-10-CM

## 2018-10-02 NOTE — Progress Notes (Signed)
Vascular and Vein Specialist of [facility]  Patient name: Steven Kent MRN: [id number] DOB: 16-Nov-1934 Sex: male  REASON FOR CONSULT: Evaluation of abdominal aortic aneurysm  HPI: Steven Kent is a 84 y.o. male, who is here today for evaluation of abdominal aortic aneurysm.  He is here with his wife.  He recently moved to [place] from [place] to be near his daughters.  He had diagnosis of abdominal aortic aneurysm and his wife thinks that this was approximately 1 year ago.  Is unknown what the size was.  He has no symptoms referable to his aneurysm.  Has no history of peripheral vascular occlusive disease.  Does have a history of stroke.  He has a remote history of cigarette smoking  Past Medical History:  Diagnosis Date  . Abdominal aortic aneurysm (AAA) (HCC)   . Hypertension   . Stroke [facility])     Family History  Problem Relation Age of Onset  . Prostate cancer Neg Hx     SOCIAL HISTORY: Social History   Socioeconomic History  . Marital status: Married    Spouse name: Not on file  . Number of children: Not on file  . Years of education: Not on file  . Highest education level: Not on file  Occupational History  . Not on file  Social Needs  . Financial resource strain: Not on file  . Food insecurity:    Worry: Not on file    Inability: Not on file  . Transportation needs:    Medical: Not on file    Non-medical: Not on file  Tobacco Use  . Smoking status: Former Games developer  . Smokeless tobacco: Never Used  Substance and Sexual Activity  . Alcohol use: Never    Frequency: Never  . Drug use: Never  . Sexual activity: Not on file  Lifestyle  . Physical activity:    Days per week: Not on file    Minutes per session: Not on file  . Stress: Not on file  Relationships  . Social connections:    Talks on phone: Not on file    Gets together: Not on file    Attends religious service: Not on file    Active member of club or  organization: Not on file    Attends meetings of clubs or organizations: Not on file    Relationship status: Not on file  . Intimate partner violence:    Fear of current or ex partner: Not on file    Emotionally abused: Not on file    Physically abused: Not on file    Forced sexual activity: Not on file  Other Topics Concern  . Not on file  Social History Narrative  . Not on file    Allergies  Allergen Reactions  . Aspirin     Full Strength Asprin-325 mg causes upset stomach. Can take 81 mg.    Current Outpatient Medications  Medication Sig Dispense Refill  . allopurinol (ZYLOPRIM) 100 MG tablet Take 1 tablet (100 mg total) by mouth 2 (two) times daily. 180 tablet 3  . amLODipine (NORVASC) 2.5 MG tablet Take 1 tablet (2.5 mg total) by mouth every evening. 90 tablet 3  . atorvastatin (LIPITOR) 40 MG tablet Take 1 tablet (40 mg total) by mouth every evening. 90 tablet 3  . brimonidine (ALPHAGAN) 0.15 % ophthalmic solution Place 1 drop into both eyes daily.    . dorzolamide (TRUSOPT) 2 % ophthalmic solution Place 1 drop into both eyes 2 (two)  times daily.    . famotidine (PEPCID) 20 MG tablet Take 1 tablet (20 mg total) by mouth daily. 60 tablet 11  . gabapentin (NEURONTIN) 400 MG capsule Take 1 capsule (400 mg total) by mouth 2 (two) times daily. 180 capsule 3  . latanoprost (XALATAN) 0.005 % ophthalmic solution Place 1 drop into both eyes at bedtime.    Marland Kitchen oxybutynin (DITROPAN) 5 MG tablet Take 1 tablet (5 mg total) by mouth 2 (two) times daily. 180 tablet 3  . rivaroxaban (XARELTO) 20 MG TABS tablet Take 1 tablet (20 mg total) by mouth every evening. 90 tablet 3  . torsemide (DEMADEX) 5 MG tablet Take 1 tablet (5 mg total) by mouth every evening. 90 tablet 3  . Vitamin D, Ergocalciferol, (DRISDOL) 1.25 MG (50000 UT) CAPS capsule Take 1 capsule (50,000 Units total) by mouth every 7 (seven) days. 12 capsule 3   No current facility-administered medications for this visit.     REVIEW  OF SYSTEMS:  [X]  denotes positive finding, [ ]  denotes negative finding Cardiac  Comments:  Chest pain or chest pressure:    Shortness of breath upon exertion:    Short of breath when lying flat:    Irregular heart rhythm:        Vascular    Pain in calf, thigh, or hip brought on by ambulation:    Pain in feet at night that wakes you up from your sleep:     Blood clot in your veins:    Leg swelling:  x       Pulmonary    Oxygen at home:    Productive cough:     Wheezing:         Neurologic    Sudden weakness in arms or legs:     Sudden numbness in arms or legs:     Sudden onset of difficulty speaking or slurred speech:    Temporary loss of vision in one eye:     Problems with dizziness:         Gastrointestinal    Blood in stool:     Vomited blood:         Genitourinary    Burning when urinating:     Blood in urine:        Psychiatric    Major depression:         Hematologic    Bleeding problems:    Problems with blood clotting too easily:        Skin    Rashes or ulcers:        Constitutional    Fever or chills:      PHYSICAL EXAM: There were no vitals filed for this visit.  GENERAL: The patient is a well-nourished male, in no acute distress. The vital signs are documented above. CARDIOVASCULAR: Carotid arteries without bruits bilaterally.  2+ radial pulses bilaterally.  2-3+ right popliteal pulse.  I do not palpate left popliteal pulse. PULMONARY: There is good air exchange  ABDOMEN: Soft and non-tender.  No bruit and no aneurysm palpable MUSCULOSKELETAL: There are no major deformities or cyanosis. NEUROLOGIC: No focal weakness or paresthesias are detected. SKIN: There are no ulcers or rashes noted. PSYCHIATRIC: The patient has a normal affect.  DATA:  Ultrasound today reveals maximal diameter of 4.4 cm abdominal aortic aneurysm with no evidence of iliac artery aneurysm  MEDICAL ISSUES: Had long discussion with the patient and his wife.  Explained the  significance of his asymptomatic moderate sized  aneurysm.  Explained extremely low risk for rupture with a small size.  I did describe symptoms of leaking aneurysm and the importance to call 911 to present to [facility].  I have recommended that we see him again in 1 year with yearly ultrasound to rule out any progression in size.  Gust open and stent graft aneurysm repair.  He certainly would be a marginal candidate for open aneurysm repair due to his age and frailty.   Larina Earthly, MD FACS [facility] and Vein Specialists of [facility] Tel [phone] Pager [phone]

## 2018-10-08 ENCOUNTER — Encounter: Payer: Self-pay | Admitting: Cardiovascular Disease

## 2018-10-08 ENCOUNTER — Ambulatory Visit (INDEPENDENT_AMBULATORY_CARE_PROVIDER_SITE_OTHER): Payer: Medicare Other | Admitting: Cardiovascular Disease

## 2018-10-08 VITALS — BP 110/74 | HR 61 | Ht 73.5 in | Wt 192.1 lb

## 2018-10-08 DIAGNOSIS — R55 Syncope and collapse: Secondary | ICD-10-CM

## 2018-10-08 DIAGNOSIS — I10 Essential (primary) hypertension: Secondary | ICD-10-CM

## 2018-10-08 DIAGNOSIS — R011 Cardiac murmur, unspecified: Secondary | ICD-10-CM | POA: Diagnosis not present

## 2018-10-08 NOTE — Patient Instructions (Signed)
Medication Instructions:  Your physician recommends that you continue on your current medications as directed. Please refer to the Current Medication list given to you today.  If you need a refill on your cardiac medications before your next appointment, please call your pharmacy.    Lab work: None Ordered    Testing/Procedures: Your physician has requested that you have an echocardiogram. Echocardiography is a painless test that uses sound waves to create images of your heart. It provides your doctor with information about the size and shape of your heart and how well your heart's chambers and valves are working. This procedure takes approximately one hour. There are no restrictions for this procedure.    Follow-Up: At [facility], you and your health needs are our priority.  As part of our continuing mission to provide you with exceptional heart care, we have created designated Provider Care Teams.  These Care Teams include your primary Cardiologist (physician) and Advanced Practice Providers (APPs -  Physician Assistants and Nurse Practitioners) who all work together to provide you with the care you need, when you need it. You will need a follow up appointment in:  1 years.  Please call our office 2 months in advance to schedule this appointment.  You may see Philip Nahser, MD or one of the following Advanced Practice Providers on your designated Care Team: Scott Weaver, PA-C Vin Bhagat, PA-C . Janine Hammond, NP   

## 2018-10-08 NOTE — Progress Notes (Signed)
Cardiology Office Note:    Date:  10/08/2018   ID:  Steven Kent, DOB 1934-09-28, MRN [id number]  PCP:  Steven Dark, MD  Cardiologist:  Steven Miss, MD  Electrophysiologist:  None   Referring MD: Steven Dark, MD   Chief Complaint  Patient presents with  . Atrial Fibrillation    October 08, 2018    Steven Kent is a 84 y.o. male with a hx of CVA in 2014.   Seen with wife Steven Kent.   We were asked to see  Him for his heart murmur and an irreg. HR   Was found to have    , AAA , HTN , VTE ( on xarelto)  There are reports that he has CAD but I cannot find any details of this  He moved from [place] so records are not here Had multiple episodes of syncope - seemingly related to dehydration   Is wheelchair bound since the CVA .   Can use a walker to get to the car.  Has had multiple DVTs.   - is now on Xarelto.   Warfarin did not keep the DVT controlled.    Past Medical History:  Diagnosis Date  . Abdominal aortic aneurysm (AAA) (HCC)   . Hypertension   . Stroke [facility])     History reviewed. No pertinent surgical history.  Current Medications: Current Meds  Medication Sig  . allopurinol (ZYLOPRIM) 100 MG tablet Take 1 tablet (100 mg total) by mouth 2 (two) times daily.  Marland Kitchen amLODipine (NORVASC) 2.5 MG tablet Take 1 tablet (2.5 mg total) by mouth every evening.  Marland Kitchen atorvastatin (LIPITOR) 40 MG tablet Take 1 tablet (40 mg total) by mouth every evening.  . brimonidine (ALPHAGAN) 0.15 % ophthalmic solution Place 1 drop into both eyes daily.  . dorzolamide (TRUSOPT) 2 % ophthalmic solution Place 1 drop into both eyes 2 (two) times daily.  . famotidine (PEPCID) 20 MG tablet Take 1 tablet (20 mg total) by mouth daily.  Marland Kitchen gabapentin (NEURONTIN) 400 MG capsule Take 1 capsule (400 mg total) by mouth 2 (two) times daily.  Marland Kitchen latanoprost (XALATAN) 0.005 % ophthalmic solution Place 1 drop into both eyes at bedtime.  Marland Kitchen oxybutynin (DITROPAN) 5 MG tablet Take 1 tablet (5 mg total)  by mouth 2 (two) times daily.  . rivaroxaban (XARELTO) 20 MG TABS tablet Take 1 tablet (20 mg total) by mouth every evening.  . torsemide (DEMADEX) 5 MG tablet Take 1 tablet (5 mg total) by mouth every evening.  . Vitamin D, Ergocalciferol, (DRISDOL) 1.25 MG (50000 UT) CAPS capsule Take 1 capsule (50,000 Units total) by mouth every 7 (seven) days.     Allergies:   Aspirin   Social History   Socioeconomic History  . Marital status: Married    Spouse name: Not on file  . Number of children: Not on file  . Years of education: Not on file  . Highest education level: Not on file  Occupational History  . Not on file  Social Needs  . Financial resource strain: Not on file  . Food insecurity:    Worry: Not on file    Inability: Not on file  . Transportation needs:    Medical: Not on file    Non-medical: Not on file  Tobacco Use  . Smoking status: Former Games developer  . Smokeless tobacco: Never Used  Substance and Sexual Activity  . Alcohol use: Never    Frequency: Never  . Drug use: Never  .  Sexual activity: Not on file  Lifestyle  . Physical activity:    Days per week: Not on file    Minutes per session: Not on file  . Stress: Not on file  Relationships  . Social connections:    Talks on phone: Not on file    Gets together: Not on file    Attends religious service: Not on file    Active member of club or organization: Not on file    Attends meetings of clubs or organizations: Not on file    Relationship status: Not on file  Other Topics Concern  . Not on file  Social History Narrative  . Not on file     Family History: The patient's family history is negative for Prostate cancer.  ROS:   Please see the history of present illness.     All other systems reviewed and are negative.  EKGs/Labs/Other Studies Reviewed:    The following studies were reviewed today:   EKG:    August 02, 2018: Normal sinus rhythm/ectopic atrial rhythm.  No ST or T wave changes.  Recent  Labs: 08/02/2018: ALT 11; BUN 13; Creatinine, Ser 1.18; Hemoglobin 14.4; Platelets 127; Potassium 3.9; Sodium 142  Recent Lipid Panel    Component Value Date/Time   CHOL 142 11/30/2017   TRIG 35 (A) 11/30/2017   HDL 50 11/30/2017   LDLCALC 82 11/30/2017    Physical Exam:    VS:  BP 110/74   Pulse 61   Ht 6' 1.5" (1.867 m)   Wt 192 lb 1.9 oz (87.1 kg)   SpO2 98%   BMI 25.00 kg/m     Wt Readings from Last 3 Encounters:  10/08/18 192 lb 1.9 oz (87.1 kg)  10/02/18 208 lb (94.3 kg)  08/16/18 194 lb 12.8 oz (88.4 kg)     GEN:   Chronically ill-appearing gentleman, examined in the wheelchair HEENT: Normal NECK: No JVD; No carotid bruits LYMPHATICS: No lymphadenopathy CARDIAC: Regular rate.  He has a 2/6 systolic ejection murmur at the left sternal border RESPIRATORY:  Clear to auscultation without rales, wheezing or rhonchi  ABDOMEN: Soft, non-tender, non-distended MUSCULOSKELETAL:  No edema; No deformity  SKIN: Warm and dry NEUROLOGIC:  Alert and oriented x 3 PSYCHIATRIC:  Normal affect   ASSESSMENT:    1. Murmur, heart   2. Essential hypertension   3. Syncope, near    PLAN:    In order of problems listed above:  1. Heart murmur: The patient has a history of a heart murmur.  He has a soft systolic murmur today.  We get an echocardiogram for further assessment of his heart murmur.  So has had some episodes of syncope/presyncope.  2.   DVT:   Continue xarelto  3  Arrhythmias -   4.   Near syncope:   He had multiple episodes of near syncope while in [place].  His wife thinks that these are likely due to volume depletion and dehydration.  He wore a monitor several times but no clear etiology was found.  For now he seems to be doing well.  Medication Adjustments/Labs and Tests Ordered: Current medicines are reviewed at length with the patient today.  Concerns regarding medicines are outlined above.  Orders Placed This Encounter  Procedures  . ECHOCARDIOGRAM COMPLETE    No orders of the defined types were placed in this encounter.    Patient Instructions  Medication Instructions:  Your physician recommends that you continue on your current medications as  directed. Please refer to the Current Medication list given to you today.  If you need a refill on your cardiac medications before your next appointment, please call your pharmacy.   Lab work: None Ordered    Testing/Procedures: Your physician has requested that you have an echocardiogram. Echocardiography is a painless test that uses sound waves to create images of your heart. It provides your doctor with information about the size and shape of your heart and how well your heart's chambers and valves are working. This procedure takes approximately one hour. There are no restrictions for this procedure.    Follow-Up: At [facility], you and your health needs are our priority.  As part of our continuing mission to provide you with exceptional heart care, we have created designated Provider Care Teams.  These Care Teams include your primary Cardiologist (physician) and Advanced Practice Providers (APPs -  Physician Assistants and Nurse Practitioners) who all work together to provide you with the care you need, when you need it. You will need a follow up appointment in:  1 years.  Please call our office 2 months in advance to schedule this appointment.  You may see Steven Miss, MD or one of the following Advanced Practice Providers on your designated Care Team: Tereso Newcomer, PA-C Vin Grill, [place] . Berton Bon, NP      Signed, Steven Miss, MD  10/08/2018 5:41 PM    [facility]

## 2018-10-16 ENCOUNTER — Telehealth: Payer: Self-pay | Admitting: Cardiology

## 2018-10-16 NOTE — Telephone Encounter (Signed)
Called patient to cancel 2D echo due to coronavirus pandemic.  Patient is currently stable with no complaints.  After discussion with Dr. Elease Hashimoto he felt patient's echo could wait.  Discussed with patient's wife and she says that he is doing well with no problems.  Please call patient to reschedule echo in 2 months.

## 2018-10-17 ENCOUNTER — Other Ambulatory Visit (HOSPITAL_COMMUNITY): Payer: Medicare Other

## 2018-11-16 ENCOUNTER — Encounter: Payer: Medicare Other | Admitting: Family Medicine

## 2018-11-28 ENCOUNTER — Encounter: Payer: Self-pay | Admitting: Podiatry

## 2018-11-28 ENCOUNTER — Ambulatory Visit (INDEPENDENT_AMBULATORY_CARE_PROVIDER_SITE_OTHER): Payer: Medicare Other | Admitting: Podiatry

## 2018-11-28 ENCOUNTER — Other Ambulatory Visit: Payer: Self-pay

## 2018-11-28 VITALS — Temp 97.5°F

## 2018-11-28 DIAGNOSIS — M79674 Pain in right toe(s): Secondary | ICD-10-CM | POA: Diagnosis not present

## 2018-11-28 DIAGNOSIS — R609 Edema, unspecified: Secondary | ICD-10-CM

## 2018-11-28 DIAGNOSIS — B351 Tinea unguium: Secondary | ICD-10-CM

## 2018-11-28 DIAGNOSIS — M79675 Pain in left toe(s): Secondary | ICD-10-CM | POA: Diagnosis not present

## 2018-11-28 DIAGNOSIS — G629 Polyneuropathy, unspecified: Secondary | ICD-10-CM

## 2018-11-28 NOTE — Progress Notes (Signed)
Complaint:  Visit Type: Patient returns to my office for continued preventative foot care services. Complaint: Patient states" my nails have grown long and thick and become painful to walk and wear shoes" Patient has been diagnosed with neuropathy.  Significant swelling right foot/leg. The patient presents for preventative foot care services. No changes to ROS  Podiatric Exam: Vascular: dorsalis pedis and posterior tibial pulses are palpable bilateral. Capillary return is immediate. Temperature gradient is WNL. Skin turgor WNL  Sensorium: Normal Semmes Weinstein monofilament test. Normal tactile sensation bilaterally. Nail Exam: Pt has thick disfigured discolored nails with subungual debris noted bilateral entire nail hallux through fifth toenails Ulcer Exam: There is no evidence of ulcer or pre-ulcerative changes or infection. Orthopedic Exam: Muscle tone and strength are WNL. No limitations in general ROM. No crepitus or effusions noted. Foot type and digits show no abnormalities. Bony prominences are unremarkable. Swelling right leg/foot Skin: No Porokeratosis. No infection or ulcers  Diagnosis:  Onychomycosis, , Pain in right toe, pain in left toes  Treatment & Plan Procedures and Treatment: Consent by patient was obtained for treatment procedures.   Debridement of mycotic and hypertrophic toenails, 1 through 5 bilateral and clearing of subungual debris. No ulceration, no infection noted.  Return Visit-Office Procedure: Patient instructed to return to the office for a follow up visit 3 months for continued evaluation and treatment.    Helane Gunther DPM

## 2018-12-14 ENCOUNTER — Telehealth (HOSPITAL_COMMUNITY): Payer: Self-pay | Admitting: Radiology

## 2018-12-14 NOTE — Telephone Encounter (Signed)
Steven Kent call back and asked questions for screening. COVID-19 Pre-Screening Questions:  . Do you currently have a fever?NO (yes = cancel and refer to pcp for e-visit) . Have you recently travelled on a cruise, internationally, or to [place], [place], [place], [place], [place], or [place], [place] [employer]) ? NO(yes = cancel, stay home, monitor symptoms, and contact pcp or initiate e-visit if symptoms develop) . Have you been in contact with someone that is currently pending confirmation of Covid19 testing or has been confirmed to have the Covid19 virus?  NO (yes = cancel, stay home, away from tested individual, monitor symptoms, and contact pcp or initiate e-visit if symptoms develop) . Are you currently experiencing fatigue or cough? NO (yes = pt should be prepared to have a mask placed at the time of their visit). . Reiterated no additional visitors. [place] no earlier than 15 minutes before appointment time. . Please bring own mask.

## 2018-12-14 NOTE — Telephone Encounter (Signed)
Left instructions for echocardiogram-asked patient for a call back to perform COVID 19 pre-screening.

## 2018-12-17 ENCOUNTER — Other Ambulatory Visit: Payer: Self-pay

## 2018-12-17 ENCOUNTER — Ambulatory Visit (HOSPITAL_COMMUNITY): Payer: Medicare Other | Attending: Cardiology

## 2018-12-17 DIAGNOSIS — R55 Syncope and collapse: Secondary | ICD-10-CM | POA: Diagnosis present

## 2018-12-17 DIAGNOSIS — R011 Cardiac murmur, unspecified: Secondary | ICD-10-CM

## 2019-01-07 ENCOUNTER — Other Ambulatory Visit: Payer: Self-pay | Admitting: Family Medicine

## 2019-01-07 NOTE — Telephone Encounter (Signed)
See request °

## 2019-01-07 NOTE — Telephone Encounter (Signed)
Copied from [place] [id number]. Topic: Quick Communication - Rx Refill/Question >> Jan 07, 2019 10:51 AM Blase Mess A wrote: Medication:brimonidine (ALPHAGAN) 0.15 % ophthalmic solution [[id number]]   Has the patient contacted their pharmacy? Yes  (Agent: If no, request that the patient contact the pharmacy for the refill.) (Agent: If yes, when and what did the pharmacy advise?)  Preferred Pharmacy (with phone number or street name): [facility] [place], [address] AT [address] [phone] (Phone) [phone] (Fax)    Agent: Please be advised that RX refills may take up to 3 business days. We ask that you follow-up with your pharmacy.

## 2019-01-09 ENCOUNTER — Other Ambulatory Visit: Payer: Self-pay

## 2019-01-09 ENCOUNTER — Telehealth: Payer: Self-pay | Admitting: Family Medicine

## 2019-01-09 MED ORDER — VITAMIN D (ERGOCALCIFEROL) 1.25 MG (50000 UNIT) PO CAPS: 50000.0000 [IU] | ORAL_CAPSULE | ORAL | 3 refills | Status: DC

## 2019-01-09 MED ORDER — FAMOTIDINE 20 MG PO TABS: 20.0000 mg | ORAL_TABLET | Freq: Every day | ORAL | 11 refills | Status: DC

## 2019-01-09 NOTE — Telephone Encounter (Signed)
Rx sent to pharmacy   

## 2019-01-09 NOTE — Telephone Encounter (Signed)
Copied from [place] [id number]. Topic: Quick Communication - Rx Refill/Question >> Jan 09, 2019  3:20 PM Alanda Slim E wrote: Medication: Vitamin D, Ergocalciferol, (DRISDOL) 1.25 MG (50000 UT) CAPS capsule famotidine (PEPCID) 20 MG tablet   Has the patient contacted their pharmacy? Call pcp for new RX  Preferred Pharmacy (with phone number or street name): [facility] [place], [address] AT [place] [phone] (Phone) [phone] (Fax)    Agent: Please be advised that RX refills may take up to 3 business days. We ask that you follow-up with your pharmacy.

## 2019-02-12 ENCOUNTER — Ambulatory Visit (INDEPENDENT_AMBULATORY_CARE_PROVIDER_SITE_OTHER): Payer: Medicare Other | Admitting: Family Medicine

## 2019-02-12 ENCOUNTER — Other Ambulatory Visit: Payer: Self-pay

## 2019-02-12 ENCOUNTER — Encounter: Payer: Self-pay | Admitting: Family Medicine

## 2019-02-12 VITALS — BP 120/74 | HR 82 | Ht 74.0 in | Wt 192.0 lb

## 2019-02-12 DIAGNOSIS — H6121 Impacted cerumen, right ear: Secondary | ICD-10-CM | POA: Diagnosis not present

## 2019-02-12 DIAGNOSIS — I2510 Atherosclerotic heart disease of native coronary artery without angina pectoris: Secondary | ICD-10-CM

## 2019-02-12 DIAGNOSIS — Z0001 Encounter for general adult medical examination with abnormal findings: Secondary | ICD-10-CM

## 2019-02-12 DIAGNOSIS — I714 Abdominal aortic aneurysm, without rupture, unspecified: Secondary | ICD-10-CM

## 2019-02-12 DIAGNOSIS — I69351 Hemiplegia and hemiparesis following cerebral infarction affecting right dominant side: Secondary | ICD-10-CM

## 2019-02-12 DIAGNOSIS — I10 Essential (primary) hypertension: Secondary | ICD-10-CM

## 2019-02-12 DIAGNOSIS — K219 Gastro-esophageal reflux disease without esophagitis: Secondary | ICD-10-CM

## 2019-02-12 DIAGNOSIS — I129 Hypertensive chronic kidney disease with stage 1 through stage 4 chronic kidney disease, or unspecified chronic kidney disease: Secondary | ICD-10-CM

## 2019-02-12 DIAGNOSIS — M109 Gout, unspecified: Secondary | ICD-10-CM | POA: Diagnosis not present

## 2019-02-12 DIAGNOSIS — N183 Chronic kidney disease, stage 3 unspecified: Secondary | ICD-10-CM

## 2019-02-12 DIAGNOSIS — E785 Hyperlipidemia, unspecified: Secondary | ICD-10-CM

## 2019-02-12 DIAGNOSIS — G6289 Other specified polyneuropathies: Secondary | ICD-10-CM | POA: Diagnosis not present

## 2019-02-12 DIAGNOSIS — R600 Localized edema: Secondary | ICD-10-CM

## 2019-02-12 DIAGNOSIS — I8290 Acute embolism and thrombosis of unspecified vein: Secondary | ICD-10-CM

## 2019-02-12 DIAGNOSIS — H409 Unspecified glaucoma: Secondary | ICD-10-CM

## 2019-02-12 DIAGNOSIS — N3281 Overactive bladder: Secondary | ICD-10-CM

## 2019-02-12 LAB — LIPID PANEL
Cholesterol: 133 mg/dL (ref 0–200)
HDL: 47.7 mg/dL (ref >39.00)
LDL Cholesterol: 74 mg/dL (ref 0–99)
NonHDL: 84.88
Total CHOL/HDL Ratio: 3
Triglycerides: 52 mg/dL (ref 0.0–149.0)
VLDL: 10.4 mg/dL (ref 0.0–40.0)

## 2019-02-12 LAB — CBC
HCT: 44.5 % (ref 39.0–52.0)
Hemoglobin: 14.6 g/dL (ref 13.0–17.0)
MCHC: 32.8 g/dL (ref 30.0–36.0)
MCV: 98 fl (ref 78.0–100.0)
Platelets: 126 10*3/uL — ABNORMAL LOW (ref 150.0–400.0)
RBC: 4.54 Mil/uL (ref 4.22–5.81)
RDW: 16.1 % — ABNORMAL HIGH (ref 11.5–15.5)
WBC: 4.4 10*3/uL (ref 4.0–10.5)

## 2019-02-12 LAB — COMPREHENSIVE METABOLIC PANEL
ALT: 8 U/L (ref 0–53)
AST: 13 U/L (ref 0–37)
Albumin: 3.9 g/dL (ref 3.5–5.2)
Alkaline Phosphatase: 58 U/L (ref 39–117)
BUN: 15 mg/dL (ref 6–23)
CO2: 32 mEq/L (ref 19–32)
Calcium: 8.9 mg/dL (ref 8.4–10.5)
Chloride: 104 mEq/L (ref 96–112)
Creatinine, Ser: 1.24 mg/dL (ref 0.40–1.50)
GFR: 67.61 mL/min (ref >60.00)
Glucose, Bld: 80 mg/dL (ref 70–99)
Potassium: 3.8 mEq/L (ref 3.5–5.1)
Sodium: 141 mEq/L (ref 135–145)
Total Bilirubin: 0.9 mg/dL (ref 0.2–1.2)
Total Protein: 6.9 g/dL (ref 6.0–8.3)

## 2019-02-12 LAB — TSH: TSH: 1.78 u[IU]/mL (ref 0.35–4.50)

## 2019-02-12 LAB — HEMOGLOBIN A1C: Hgb A1c MFr Bld: 5.6 % (ref 4.6–6.5)

## 2019-02-12 LAB — VITAMIN B12: Vitamin B-12: 621 pg/mL (ref 211–911)

## 2019-02-12 LAB — VITAMIN D 25 HYDROXY (VIT D DEFICIENCY, FRACTURES): VITD: 103.95 ng/mL (ref 30.00–100.00)

## 2019-02-12 MED ORDER — TORSEMIDE 10 MG PO TABS: 5.0000 mg | ORAL_TABLET | Freq: Every day | ORAL | 3 refills | Status: DC

## 2019-02-12 NOTE — Assessment & Plan Note (Addendum)
Stable.  Continue statin. 

## 2019-02-12 NOTE — Assessment & Plan Note (Signed)
Check C met.  Check vitamin D and B12.

## 2019-02-12 NOTE — Assessment & Plan Note (Signed)
Check lipid panel.  Continue Lipitor 40 mg daily. 

## 2019-02-12 NOTE — Assessment & Plan Note (Signed)
Stable.  Continue gabapentin 400 mg twice daily.  Does not want to increase dose at this time

## 2019-02-12 NOTE — Assessment & Plan Note (Signed)
Stop Pepcid as patient does not think he has a history of reflux.  Discussed reasons to return to care.

## 2019-02-12 NOTE — Assessment & Plan Note (Signed)
Stable.  Continue oxybutynin 5 mg twice daily. °

## 2019-02-12 NOTE — Assessment & Plan Note (Signed)
Stop allopurinol as he does not think he has gout.  Discussed reasons to return to care.

## 2019-02-12 NOTE — Progress Notes (Signed)
Chief Complaint:  Steven Kent is a 84 y.o. male who presents today for his annual comprehensive physical exam and subsequent medicare annual wellness visit.    Assessment/Plan:  Coronary artery disease involving native heart without angina pectoris Stable.  Continue statin and Xarelto.  Overactive bladder Stable.  Continue oxybutynin 5 mg twice daily.  Hyperlipidemia Check lipid panel.  Continue Lipitor 40 mg daily.  Gastroesophageal reflux disease Stop Pepcid as patient does not think he has a history of reflux.  Discussed reasons to return to care.  Leg edema Will increase torsemide to 10 mg daily.  Discussed importance of salt avoidance and frequent leg elevation.  Glaucoma Will have office visit with ophthalmology tomorrow.  Peripheral neuropathy Stable.  Continue gabapentin 400 mg twice daily.  Does not want to increase dose at this time  VTE (venous thromboembolism) Continue Xarelto 20 mg daily.  Hypertensive kidney disease with chronic kidney disease stage III (HCC) Check C met.  Check vitamin D and B12.  Essential hypertension At goal.  Continue amlodipine 2.5 mg daily.  Gout Stop allopurinol as he does not think he has gout.  Discussed reasons to return to care.  Abdominal aortic aneurysm (AAA) ([place]) Continue management per vascular.   Hemiparesis affecting right side as late effect of cerebrovascular accident ([place]) Stable.  Continue statin.    Body mass index is 24.65 kg/m.    Preventative Healthcare: Due for flu shot in the fall. Does not need any further cancer screenings or other immunizations.   Patient Counseling(The following topics were reviewed and/or handout was given):  -Nutrition: Stressed importance of moderation in sodium/caffeine intake, saturated fat and cholesterol, caloric balance, sufficient intake of fresh fruits, vegetables, and fiber.  -Stressed the importance of regular exercise.   -Substance Abuse: Discussed  cessation/primary prevention of tobacco, alcohol, or other drug use; driving or other dangerous activities under the influence; availability of treatment for abuse.   -Injury prevention: Discussed safety belts, safety helmets, smoke detector, smoking near bedding or upholstery.   -Sexuality: Discussed sexually transmitted diseases, partner selection, use of condoms, avoidance of unintended pregnancy and contraceptive alternatives.   -Dental health: Discussed importance of regular tooth brushing, flossing, and dental visits.  -Health maintenance and immunizations reviewed. Please refer to Health maintenance section.  During the course of the visit the patient was educated and counseled about appropriate screening and preventive services including:        Fall prevention   Nutrition Physical Activity Weight Management Cognition  Return to care in 1 year for next preventative visit.     Subjective:  HPI:  Health Risk Assessment: Patient considers his overall health to be good. He has no difficulty performing the following: . Preparing food and eating . Bathing  . Getting dressed . Using the toilet . Shopping  His wife assists him with the following tasks: Marland Kitchen Managing Finances . Moving around from place to place  He has not had any falls within the past year.   Depression screen PHQ 2/9 02/12/2019  Decreased Interest 0  Down, Depressed, Hopeless 0  PHQ - 2 Score 0    Lifestyle Factors: Diet: Balanced. Tries to eat plenty of fruits and vegetables.  Exercise: No specific exercises.   Patient Care Team: Vivi Barrack, MD as PCP - General (Family Medicine) Nahser, Wonda Cheng, MD as PCP - Cardiology (Cardiology)   His chronic medical conditions are outlined below:  # Gout - On allopurinol 161m bid and tolerating well. - No recent  flares  # Essential Hypertension - On norvasc 2.50m daily and tolerating well. 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  #Glaucoma -Follows with ophthalmology.    # CKD3 - Follows with nephrology  # Onychomycosis - Follows with podiatry  # Chronic LE Edema - Takes torsemide 582mevery evening  # GERD - On pepcid 2030maily and tolerating well.  # HLD - On lipitor 30m49mily and tolerating well. - ROS: No reported mylagias  # Overactive Bladder - On oxybutynin 5mg 28m and tolerating well.  # History of Stroke in 2007 with residual right side weakness -Symptoms are stable.  Able perform ADLs independently.  ROS: Per HPI, otherwise a complete review of systems was negative.   PMH:  The following were reviewed and entered/updated in epic: Past Medical History:  Diagnosis Date  . Abdominal aortic aneurysm (AAA) (HCC) [place] Hypertension   . [facility]   Diagnosis Date Noted  . Essential hypertension 08/16/2018  . Hypertensive kidney disease with chronic kidney disease stage III (HCC) [place]16/2020  . VTE (venous thromboembolism) 08/16/2018  . Peripheral neuropathy 08/16/2018  . Glaucoma 08/16/2018  . Onychomycosis 08/16/2018  . Leg edema 08/16/2018  . Hyperlipidemia 08/16/2018  . Overactive bladder 08/16/2018  . Coronary artery disease involving native heart without angina pectoris 08/16/2018  . Hemiparesis affecting right side as late effect of cerebrovascular accident (HCC) [place] Abdominal aortic aneurysm (AAA) (HCC) [place]. No pertinent surgical history.  Family History  Problem Relation Age of Onset  . Prostate cancer Neg Hx     Medications- reviewed and updated Current Outpatient Medications  Medication Sig Dispense Refill  . amLODipine (NORVASC) 2.5 MG tablet Take 1 tablet (2.5 mg total) by mouth every  evening. 90 tablet 3  . atorvastatin (LIPITOR) 40 MG tablet Take 1 tablet (40 mg total) by mouth every evening. 90 tablet 3  . brimonidine (ALPHAGAN) 0.15 % ophthalmic solution Place 1 drop into both eyes daily.    . dorzolamide (TRUSOPT) 2 % ophthalmic solution Place 1 drop into both eyes 2 (two) times daily.    . gabMarland Kitchenpentin (NEURONTIN) 400 MG capsule Take 1 capsule (400 mg total) by mouth 2 (two) times daily. 180 capsule 3  . latanoprost (XALATAN) 0.005 % ophthalmic solution Place 1 drop into both eyes at bedtime.    . oxyMarland Kitchenutynin (DITROPAN) 5 MG tablet Take 1 tablet (5 mg total) by mouth 2 (two) times daily. 180 tablet 3  . rivaroxaban (XARELTO) 20 MG TABS tablet Take 1 tablet (20 mg total) by mouth every evening. 90 tablet 3  . torsemide (DEMADEX) 10 MG tablet Take 0.5 tablets (5 mg total) by mouth daily. 90 tablet 3  . Vitamin D, Ergocalciferol, (DRISDOL) 1.25 MG (50000 UT) CAPS capsule Take 1 capsule (50,000 Units total) by mouth every 7 (seven) days. 12 capsule 3   No current facility-administered medications for this visit.     Allergies-reviewed and updated Allergies  Allergen Reactions  . Aspirin     Full Strength Asprin-325 mg causes upset stomach. Can take 81 mg.    Social History   Socioeconomic History  . Marital  status: Married    Spouse name: Not on file  . Number of children: Not on file  . Years of education: Not on file  . Highest education level: Not on file  Occupational History  . Not on file  Social Needs  . Financial resource strain: Not on file  . Food insecurity    Worry: Not on file    Inability: Not on file  . Transportation needs    Medical: Not on file    Non-medical: Not on file  Tobacco Use  . Smoking status: Former Research scientist (life sciences)  . Smokeless tobacco: Never Used  Substance and Sexual Activity  . Alcohol use: Never    Frequency: Never  . Drug use: Never  . Sexual activity: Not on file  Lifestyle  . Physical activity    Days per week: Not on file     Minutes per session: Not on file  . Stress: Not on file  Relationships  . Social Herbalist on phone: Not on file    Gets together: Not on file    Attends religious service: Not on file    Active member of club or organization: Not on file    Attends meetings of clubs or organizations: Not on file    Relationship status: Not on file  Other Topics Concern  . Not on file  Social History Narrative  . Not on file        Objective:  Physical Exam: BP 120/74 (BP Location: Left Arm, Patient Position: Sitting, Cuff Size: Normal)   Pulse 82   Ht _0  (1.88 m)   Wt 192 lb (87.1 kg)   SpO2 94%   BMI 24.65 kg/m   Body mass index is 24.65 kg/m. Wt Readings from Last 3 Encounters:  02/12/19 192 lb (87.1 kg)  10/08/18 192 lb 1.9 oz (87.1 kg)  10/02/18 208 lb (94.3 kg)   Gen: NAD, resting comfortably, sitting in wheelchair HEENT: right EAC with impacted cerumen. Left TM and EAC clear. OP clear. No thyromegaly noted.  CV: RRR with no murmurs appreciated Pulm: NWOB, CTAB with no crackles, wheezes, or rhonchi GI: Normal bowel sounds present. Soft, Nontender, Nondistended. MSK: no edema, cyanosis, or clubbing noted Skin: warm, dry Neuro: CN2-12 grossly intact. Strength 5/5 in upper and lower extremities. Reflexes symmetric and intact bilaterally. No apparent cognitive impairment.  Psych: Normal affect and thought content  Impacted Cerumen Irrigation Procedure Note Patient has impacted cerumen based on diagnostic criteria of inspection and hearing loss.  Irrigation was recommended.  Verbal consent was obtained.  The EACs were then irrigated with successful removal of impacted cerumen.  Patient tolerated well without complications.     Algis Greenhouse. Jerline Pain, MD 02/12/2019 12:46 PM

## 2019-02-12 NOTE — Assessment & Plan Note (Signed)
Will increase torsemide to 10 mg daily.  Discussed importance of salt avoidance and frequent leg elevation.

## 2019-02-12 NOTE — Assessment & Plan Note (Signed)
At goal.  Continue amlodipine 2.5 mg daily. 

## 2019-02-12 NOTE — Assessment & Plan Note (Signed)
Continue Xarelto 20mg daily.

## 2019-02-12 NOTE — Assessment & Plan Note (Signed)
Continue management per vascular

## 2019-02-12 NOTE — Assessment & Plan Note (Addendum)
Stable.  Continue statin and Xarelto. 

## 2019-02-12 NOTE — Patient Instructions (Addendum)
It was very nice to see you today!  Please stop the allopurinol and Pepcid.  Increase your torsemide to 10 mg daily.  We will check blood work today.  Come back to see me in 6 months, or sooner if needed.  Take care, Dr Steven Kent  Please try these tips to maintain a healthy lifestyle:   Eat at least 3 REAL meals and 1-2 snacks per day.  Aim for no more than 5 hours between eating.  If you eat breakfast, please do so within one hour of getting up.    Obtain twice as many fruits/vegetables as protein or carbohydrate foods for both lunch and dinner. (Half of each meal should be fruits/vegetables, one quarter protein, and one quarter starchy carbs)   Cut down on sweet beverages. This includes juice, soda, and sweet tea.    Exercise at least 150 minutes every week.    Preventive Care 82 Years and Older, Male Preventive care refers to lifestyle choices and visits with your health care provider that can promote health and wellness. This includes:  A yearly physical exam. This is also called an annual well check.  Regular dental and eye exams.  Immunizations.  Screening for certain conditions.  Healthy lifestyle choices, such as diet and exercise. What can I expect for my preventive care visit? Physical exam Your health care provider will check:  Height and weight. These may be used to calculate body mass index (BMI), which is a measurement that tells if you are at a healthy weight.  Heart rate and blood pressure.  Your skin for abnormal spots. Counseling Your health care provider may ask you questions about:  Alcohol, tobacco, and drug use.  Emotional well-being.  Home and relationship well-being.  Sexual activity.  Eating habits.  History of falls.  Memory and ability to understand (cognition).  Work and work Statistician. What immunizations do I need?  Influenza (flu) vaccine  This is recommended every year. Tetanus, diphtheria, and pertussis (Tdap)  vaccine  You may need a Td booster every 10 years. Varicella (chickenpox) vaccine  You may need this vaccine if you have not already been vaccinated. Zoster (shingles) vaccine  You may need this after age 68. Pneumococcal conjugate (PCV13) vaccine  One dose is recommended after age 72. Pneumococcal polysaccharide (PPSV23) vaccine  One dose is recommended after age 20. Measles, mumps, and rubella (MMR) vaccine  You may need at least one dose of MMR if you were born in 1957 or later. You may also need a second dose. Meningococcal conjugate (MenACWY) vaccine  You may need this if you have certain conditions. Hepatitis A vaccine  You may need this if you have certain conditions or if you travel or work in places where you may be exposed to hepatitis A. Hepatitis B vaccine  You may need this if you have certain conditions or if you travel or work in places where you may be exposed to hepatitis B. Haemophilus influenzae type b (Hib) vaccine  You may need this if you have certain conditions. You may receive vaccines as individual doses or as more than one vaccine together in one shot (combination vaccines). Talk with your health care provider about the risks and benefits of combination vaccines. What tests do I need? Blood tests  Lipid and cholesterol levels. These may be checked every 5 years, or more frequently depending on your overall health.  Hepatitis C test.  Hepatitis B test. Screening  Lung cancer screening. You may have this screening  every year starting at age 49 if you have a 30-pack-year history of smoking and currently smoke or have quit within the past 15 years.  Colorectal cancer screening. All adults should have this screening starting at age 64 and continuing until age 37. Your health care provider may recommend screening at age 40 if you are at increased risk. You will have tests every 1-10 years, depending on your results and the type of screening test.   Prostate cancer screening. Recommendations will vary depending on your family history and other risks.  Diabetes screening. This is done by checking your blood sugar (glucose) after you have not eaten for a while (fasting). You may have this done every 1-3 years.  Abdominal aortic aneurysm (AAA) screening. You may need this if you are a current or former smoker.  Sexually transmitted disease (STD) testing. Follow these instructions at home: Eating and drinking  Eat a diet that includes fresh fruits and vegetables, whole grains, lean protein, and low-fat dairy products. Limit your intake of foods with high amounts of sugar, saturated fats, and salt.  Take vitamin and mineral supplements as recommended by your health care provider.  Do not drink alcohol if your health care provider tells you not to drink.  If you drink alcohol: ? Limit how much you have to 0-2 drinks a day. ? Be aware of how much alcohol is in your drink. In the [place] one drink equals one 12 oz bottle of beer (355 mL), one 5 oz glass of wine (148 mL), or one 1 oz glass of hard liquor (44 mL). Lifestyle  Take daily care of your teeth and gums.  Stay active. Exercise for at least 30 minutes on 5 or more days each week.  Do not use any products that contain nicotine or tobacco, such as cigarettes, e-cigarettes, and chewing tobacco. If you need help quitting, ask your health care provider.  If you are sexually active, practice safe sex. Use a condom or other form of protection to prevent STIs (sexually transmitted infections).  Talk with your health care provider about taking a low-dose aspirin or statin. What's next?  Visit your health care provider once a year for a well check visit.  Ask your health care provider how often you should have your eyes and teeth checked.  Stay up to date on all vaccines. This information is not intended to replace advice given to you by your health care provider. Make sure you discuss  any questions you have with your health care provider. Document Released: 08/14/2015 Document Revised: 07/12/2018 Document Reviewed: 07/12/2018 Elsevier Patient Education  2020 [employer].

## 2019-02-12 NOTE — Assessment & Plan Note (Signed)
Will have office visit with ophthalmology tomorrow.

## 2019-02-14 NOTE — Progress Notes (Signed)
Please inform patient of the following:  His vitamin D is slightly elevated, but everything else is normal / STABLE. Recommend that the skip his next dose of vitamin D, then we can resume as prescribed. We can recheck again at his next follow up in 6 months.   Steven Kent. Jerline Pain, MD 02/14/2019 12:31 PM

## 2019-02-27 ENCOUNTER — Other Ambulatory Visit: Payer: Self-pay

## 2019-02-27 ENCOUNTER — Encounter: Payer: Self-pay | Admitting: Podiatry

## 2019-02-27 ENCOUNTER — Telehealth: Payer: Self-pay

## 2019-02-27 ENCOUNTER — Ambulatory Visit (INDEPENDENT_AMBULATORY_CARE_PROVIDER_SITE_OTHER): Payer: Medicare Other | Admitting: Podiatry

## 2019-02-27 VITALS — Temp 99.0°F

## 2019-02-27 DIAGNOSIS — M79674 Pain in right toe(s): Secondary | ICD-10-CM

## 2019-02-27 DIAGNOSIS — G629 Polyneuropathy, unspecified: Secondary | ICD-10-CM | POA: Diagnosis not present

## 2019-02-27 DIAGNOSIS — R609 Edema, unspecified: Secondary | ICD-10-CM

## 2019-02-27 DIAGNOSIS — M79675 Pain in left toe(s): Secondary | ICD-10-CM | POA: Diagnosis not present

## 2019-02-27 DIAGNOSIS — B351 Tinea unguium: Secondary | ICD-10-CM

## 2019-02-27 MED ORDER — TORSEMIDE 10 MG PO TABS: 10.0000 mg | ORAL_TABLET | Freq: Every day | ORAL | 3 refills | Status: DC

## 2019-02-27 NOTE — Progress Notes (Signed)
Complaint:  Visit Type: Patient returns to my office for continued preventative foot care services. Complaint: Patient states" my nails have grown long and thick and become painful to walk and wear shoes" Patient has been diagnosed with neuropathy.  Significant swelling right foot/leg. The patient presents for preventative foot care services. No changes to ROS. Patient is brought to the office by male caregiver.  Podiatric Exam: Vascular: dorsalis pedis and posterior tibial pulses are palpable bilateral. Capillary return is immediate. Temperature gradient is WNL. Skin turgor WNL  Sensorium: Normal Semmes Weinstein monofilament test. Normal tactile sensation bilaterally. Nail Exam: Pt has thick disfigured discolored nails with subungual debris noted bilateral entire nail hallux through fifth toenails Ulcer Exam: There is no evidence of ulcer or pre-ulcerative changes or infection. Orthopedic Exam: Muscle tone and strength are WNL. No limitations in general ROM. No crepitus or effusions noted. Foot type and digits show no abnormalities. Bony prominences are unremarkable. Swelling right leg/foot secondary to CVA. Skin: No Porokeratosis. No infection or ulcers  Diagnosis:  Onychomycosis, , Pain in right toe, pain in left toes  Treatment & Plan Procedures and Treatment: Consent by patient was obtained for treatment procedures.   Debridement of mycotic and hypertrophic toenails, 1 through 5 bilateral and clearing of subungual debris. No ulceration, no infection noted.  Return Visit-Office Procedure: Patient instructed to return to the office for a follow up visit 3 months for continued evaluation and treatment.    Gardiner Barefoot DPM

## 2019-02-27 NOTE — Telephone Encounter (Deleted)
Spoke with patient wife regarding

## 2019-02-27 NOTE — Telephone Encounter (Addendum)
Patient wife stated pharmacy had wrong dosage of Torsemide wants to clarify dosage. Patient was seen on 02/12/19 OV note stated change Torsemide to 10 mg daily will call the pharmacy to change dosage. Patient directions at the pharmacy was to take a half tablet daily. Corrected.

## 2019-02-28 NOTE — Telephone Encounter (Signed)
Noted. Thank you for making the correction.  Steven Kent. Jerline Pain, MD 02/28/2019 4:45 PM

## 2019-06-04 ENCOUNTER — Other Ambulatory Visit: Payer: Self-pay

## 2019-06-04 ENCOUNTER — Ambulatory Visit (INDEPENDENT_AMBULATORY_CARE_PROVIDER_SITE_OTHER): Payer: Medicare Other | Admitting: Podiatry

## 2019-06-04 ENCOUNTER — Encounter: Payer: Self-pay | Admitting: Podiatry

## 2019-06-04 DIAGNOSIS — G629 Polyneuropathy, unspecified: Secondary | ICD-10-CM

## 2019-06-04 DIAGNOSIS — M79674 Pain in right toe(s): Secondary | ICD-10-CM

## 2019-06-04 DIAGNOSIS — M79675 Pain in left toe(s): Secondary | ICD-10-CM | POA: Diagnosis not present

## 2019-06-04 DIAGNOSIS — B351 Tinea unguium: Secondary | ICD-10-CM | POA: Diagnosis not present

## 2019-06-04 DIAGNOSIS — R609 Edema, unspecified: Secondary | ICD-10-CM | POA: Diagnosis not present

## 2019-06-04 NOTE — Progress Notes (Signed)
Complaint:  Visit Type: Patient returns to my office for continued preventative foot care services. Complaint: Patient states" my nails have grown long and thick and become painful to walk and wear shoes" Patient has been diagnosed with neuropathy.  Significant swelling right foot/leg. The patient presents for preventative foot care services. No changes to ROS. Patient is brought to the office by male caregiver.  Podiatric Exam: Vascular: dorsalis pedis and posterior tibial pulses are palpable bilateral. Capillary return is immediate. Temperature gradient is WNL. Skin turgor WNL  Sensorium: Normal Semmes Weinstein monofilament test. Normal tactile sensation bilaterally. Nail Exam: Pt has thick disfigured discolored nails with subungual debris noted bilateral entire nail hallux through fifth toenails Ulcer Exam: There is no evidence of ulcer or pre-ulcerative changes or infection. Orthopedic Exam: Muscle tone and strength are WNL. No limitations in general ROM. No crepitus or effusions noted. Foot type and digits show no abnormalities. Bony prominences are unremarkable. Swelling right leg/foot secondary to CVA. Skin: No Porokeratosis. No infection or ulcers  Diagnosis:  Onychomycosis, , Pain in right toe, pain in left toes  Treatment & Plan Procedures and Treatment: Consent by patient was obtained for treatment procedures.   Debridement of mycotic and hypertrophic toenails, 1 through 5 bilateral and clearing of subungual debris. No ulceration, no infection noted.  Return Visit-Office Procedure: Patient instructed to return to the office for a follow up visit 3 months for continued evaluation and treatment.    Gardiner Barefoot DPM

## 2019-08-23 ENCOUNTER — Emergency Department (HOSPITAL_COMMUNITY): Payer: Medicare Other

## 2019-08-23 ENCOUNTER — Encounter (HOSPITAL_COMMUNITY): Payer: Self-pay

## 2019-08-23 ENCOUNTER — Inpatient Hospital Stay (HOSPITAL_COMMUNITY)
Admission: EM | Admit: 2019-08-23 | Discharge: 2019-08-25 | DRG: 309 | Disposition: A | Discharge status: Home Health | Payer: Medicare Other | Attending: Internal Medicine | Admitting: Internal Medicine

## 2019-08-23 ENCOUNTER — Other Ambulatory Visit: Payer: Self-pay

## 2019-08-23 DIAGNOSIS — I129 Hypertensive chronic kidney disease with stage 1 through stage 4 chronic kidney disease, or unspecified chronic kidney disease: Secondary | ICD-10-CM | POA: Diagnosis not present

## 2019-08-23 DIAGNOSIS — H5461 Unqualified visual loss, right eye, normal vision left eye: Secondary | ICD-10-CM | POA: Diagnosis present

## 2019-08-23 DIAGNOSIS — I959 Hypotension, unspecified: Secondary | ICD-10-CM | POA: Diagnosis not present

## 2019-08-23 DIAGNOSIS — Z87891 Personal history of nicotine dependence: Secondary | ICD-10-CM

## 2019-08-23 DIAGNOSIS — Z79899 Other long term (current) drug therapy: Secondary | ICD-10-CM | POA: Diagnosis not present

## 2019-08-23 DIAGNOSIS — I2510 Atherosclerotic heart disease of native coronary artery without angina pectoris: Secondary | ICD-10-CM | POA: Diagnosis present

## 2019-08-23 DIAGNOSIS — I10 Essential (primary) hypertension: Secondary | ICD-10-CM | POA: Diagnosis present

## 2019-08-23 DIAGNOSIS — I714 Abdominal aortic aneurysm, without rupture, unspecified: Secondary | ICD-10-CM | POA: Diagnosis present

## 2019-08-23 DIAGNOSIS — R404 Transient alteration of awareness: Secondary | ICD-10-CM | POA: Diagnosis present

## 2019-08-23 DIAGNOSIS — I69351 Hemiplegia and hemiparesis following cerebral infarction affecting right dominant side: Secondary | ICD-10-CM | POA: Diagnosis not present

## 2019-08-23 DIAGNOSIS — I4891 Unspecified atrial fibrillation: Secondary | ICD-10-CM | POA: Diagnosis not present

## 2019-08-23 DIAGNOSIS — Z86718 Personal history of other venous thrombosis and embolism: Secondary | ICD-10-CM

## 2019-08-23 DIAGNOSIS — N182 Chronic kidney disease, stage 2 (mild): Secondary | ICD-10-CM | POA: Diagnosis present

## 2019-08-23 DIAGNOSIS — D72819 Decreased white blood cell count, unspecified: Secondary | ICD-10-CM | POA: Diagnosis not present

## 2019-08-23 DIAGNOSIS — D696 Thrombocytopenia, unspecified: Secondary | ICD-10-CM | POA: Diagnosis not present

## 2019-08-23 DIAGNOSIS — I440 Atrioventricular block, first degree: Secondary | ICD-10-CM | POA: Diagnosis not present

## 2019-08-23 DIAGNOSIS — R001 Bradycardia, unspecified: Principal | ICD-10-CM | POA: Diagnosis present

## 2019-08-23 DIAGNOSIS — Z20822 Contact with and (suspected) exposure to covid-19: Secondary | ICD-10-CM | POA: Diagnosis present

## 2019-08-23 DIAGNOSIS — Z993 Dependence on wheelchair: Secondary | ICD-10-CM

## 2019-08-23 DIAGNOSIS — R4189 Other symptoms and signs involving cognitive functions and awareness: Secondary | ICD-10-CM | POA: Diagnosis present

## 2019-08-23 DIAGNOSIS — N183 Chronic kidney disease, stage 3 unspecified: Secondary | ICD-10-CM | POA: Diagnosis present

## 2019-08-23 DIAGNOSIS — Z7901 Long term (current) use of anticoagulants: Secondary | ICD-10-CM

## 2019-08-23 DIAGNOSIS — Z886 Allergy status to analgesic agent status: Secondary | ICD-10-CM | POA: Diagnosis not present

## 2019-08-23 LAB — COMPREHENSIVE METABOLIC PANEL
ALT: 11 U/L (ref 0–44)
AST: 22 U/L (ref 15–41)
Albumin: 3.5 g/dL (ref 3.5–5.0)
Alkaline Phosphatase: 44 U/L (ref 38–126)
Anion gap: 9 (ref 5–15)
BUN: 21 mg/dL (ref 8–23)
CO2: 26 mmol/L (ref 22–32)
Calcium: 8.5 mg/dL — ABNORMAL LOW (ref 8.9–10.3)
Chloride: 100 mmol/L (ref 98–111)
Creatinine, Ser: 1.4 mg/dL — ABNORMAL HIGH (ref 0.61–1.24)
GFR calc Af Amer: 54 mL/min — ABNORMAL LOW (ref >60)
GFR calc non Af Amer: 46 mL/min — ABNORMAL LOW (ref >60)
Glucose, Bld: 96 mg/dL (ref 70–99)
Potassium: 4.2 mmol/L (ref 3.5–5.1)
Sodium: 135 mmol/L (ref 135–145)
Total Bilirubin: 1 mg/dL (ref 0.3–1.2)
Total Protein: 6.5 g/dL (ref 6.5–8.1)

## 2019-08-23 LAB — CBC WITH DIFFERENTIAL/PLATELET
Abs Immature Granulocytes: 0.01 10*3/uL (ref 0.00–0.07)
Basophils Absolute: 0 10*3/uL (ref 0.0–0.1)
Basophils Relative: 0 %
Eosinophils Absolute: 0.2 10*3/uL (ref 0.0–0.5)
Eosinophils Relative: 4 %
HCT: 42.2 % (ref 39.0–52.0)
Hemoglobin: 13.3 g/dL (ref 13.0–17.0)
Immature Granulocytes: 0 %
Lymphocytes Relative: 28 %
Lymphs Abs: 1.1 10*3/uL (ref 0.7–4.0)
MCH: 32.1 pg (ref 26.0–34.0)
MCHC: 31.5 g/dL (ref 30.0–36.0)
MCV: 101.9 fL — ABNORMAL HIGH (ref 80.0–100.0)
Monocytes Absolute: 0.4 10*3/uL (ref 0.1–1.0)
Monocytes Relative: 12 %
Neutro Abs: 2.1 10*3/uL (ref 1.7–7.7)
Neutrophils Relative %: 56 %
Platelets: 121 10*3/uL — ABNORMAL LOW (ref 150–400)
RBC: 4.14 MIL/uL — ABNORMAL LOW (ref 4.22–5.81)
RDW: 14.6 % (ref 11.5–15.5)
WBC: 3.8 10*3/uL — ABNORMAL LOW (ref 4.0–10.5)
nRBC: 0 % (ref 0.0–0.2)

## 2019-08-23 LAB — I-STAT CHEM 8, ED
BUN: 25 mg/dL — ABNORMAL HIGH (ref 8–23)
Calcium, Ion: 1.07 mmol/L — ABNORMAL LOW (ref 1.15–1.40)
Chloride: 101 mmol/L (ref 98–111)
Creatinine, Ser: 1.4 mg/dL — ABNORMAL HIGH (ref 0.61–1.24)
Glucose, Bld: 92 mg/dL (ref 70–99)
HCT: 40 % (ref 39.0–52.0)
Hemoglobin: 13.6 g/dL (ref 13.0–17.0)
Potassium: 4.1 mmol/L (ref 3.5–5.1)
Sodium: 138 mmol/L (ref 135–145)
TCO2: 30 mmol/L (ref 22–32)

## 2019-08-23 LAB — TROPONIN I (HIGH SENSITIVITY)
Troponin I (High Sensitivity): 14 ng/L (ref <18)
Troponin I (High Sensitivity): 16 ng/L (ref <18)

## 2019-08-23 LAB — BRAIN NATRIURETIC PEPTIDE: B Natriuretic Peptide: 41.3 pg/mL (ref 0.0–100.0)

## 2019-08-23 LAB — POC SARS CORONAVIRUS 2 AG -  ED: SARS Coronavirus 2 Ag: NEGATIVE

## 2019-08-23 NOTE — ED Notes (Signed)
Per Dr. Silverio Lay, patients wife allowed at bedside.

## 2019-08-23 NOTE — ED Triage Notes (Signed)
Pt bib [facility] with c/o apnea. Per family pt had episode where he stopped breathing for several minutes. On EMS arrival pt had radial pulses and normal breathing. Pt AOx4 w/ hx of CVA with R sided weakness. EMS 12 lead shows complete heart block with HR in the 40's.

## 2019-08-23 NOTE — ED Provider Notes (Signed)
[facility] EMERGENCY DEPARTMENT Provider Note   CSN: [id number] Arrival date & time: 08/23/19  1627     History Chief Complaint  Patient presents with  . Bradycardia    Steven Kent is a 85 y.o. male history of AAA, stroke with right-sided weakness, A. fib on Xarelto here presenting with apneic episode .  Patient is at home with his family .  Per the wife, patient had an episode where he stopped breathing for several minutes .  She called EMS and was told to check a pulse and patient had a pulse at that time.  When fire department got there, he was noted to be bradycardic and became more responsive.  He was also noted to be hypotensive in the 90s and EKG showed a possible heart block.  Patient states that he has no symptoms and feels fine.  In particular, he has no chest pain or shortness of breath and denies passing out.  The history is provided by the patient and a relative.   Level V caveat- condition of patient     Past Medical History:  Diagnosis Date  . Abdominal aortic aneurysm (AAA) (HCC)   . Hypertension   . Stroke [facility])     Patient Active Problem List   Diagnosis Date Noted  . Essential hypertension 08/16/2018  . Hypertensive kidney disease with chronic kidney disease stage III 08/16/2018  . VTE (venous thromboembolism) 08/16/2018  . Peripheral neuropathy 08/16/2018  . Glaucoma 08/16/2018  . Onychomycosis 08/16/2018  . Leg edema 08/16/2018  . Hyperlipidemia 08/16/2018  . Overactive bladder 08/16/2018  . Coronary artery disease involving native heart without angina pectoris 08/16/2018  . Hemiparesis affecting right side as late effect of cerebrovascular accident (HCC)   . Abdominal aortic aneurysm (AAA) (HCC)     History reviewed. No pertinent surgical history.     Family History  Problem Relation Age of Onset  . Prostate cancer Neg Hx     Social History   Tobacco Use  . Smoking status: Former Games developer  . Smokeless tobacco: Never  Used  Substance Use Topics  . Alcohol use: Never  . Drug use: Never    Home Medications Prior to Admission medications   Medication Sig Start Date End Date Taking? Authorizing Provider  amLODipine (NORVASC) 2.5 MG tablet Take 1 tablet (2.5 mg total) by mouth every evening. 08/16/18  Yes Ardith Dark, MD  atorvastatin (LIPITOR) 40 MG tablet Take 1 tablet (40 mg total) by mouth every evening. 08/16/18  Yes Ardith Dark, MD  brimonidine (ALPHAGAN) 0.15 % ophthalmic solution Place 1 drop into both eyes daily.   Yes [provider]  dorzolamide (TRUSOPT) 2 % ophthalmic solution Place 1 drop into both eyes 2 (two) times daily.   Yes [provider]  gabapentin (NEURONTIN) 400 MG capsule Take 1 capsule (400 mg total) by mouth 2 (two) times daily. 08/16/18  Yes Ardith Dark, MD  latanoprost (XALATAN) 0.005 % ophthalmic solution Place 1 drop into both eyes at bedtime.   Yes [provider]  oxybutynin (DITROPAN) 5 MG tablet Take 1 tablet (5 mg total) by mouth 2 (two) times daily. 08/16/18  Yes Ardith Dark, MD  rivaroxaban (XARELTO) 20 MG TABS tablet Take 1 tablet (20 mg total) by mouth every evening. 08/16/18  Yes Ardith Dark, MD  torsemide (DEMADEX) 10 MG tablet Take 1 tablet (10 mg total) by mouth daily. 02/27/19  Yes Ardith Dark, MD  Vitamin D,  Ergocalciferol, (DRISDOL) 1.25 MG (50000 UT) CAPS capsule Take 1 capsule (50,000 Units total) by mouth every 7 (seven) days. 01/09/19  Yes Vivi Barrack, MD    Allergies    Aspirin  Review of Systems   Review of Systems  Respiratory:       Apnea   All other systems reviewed and are negative.   Physical Exam Updated Vital Signs BP 124/73   Pulse (!) 57   Temp 97.6 F (36.4 C) (Oral)   Resp 14   SpO2 98%   Physical Exam Vitals and nursing note reviewed.  Constitutional:      Comments: Chronically ill   HENT:     Head: Normocephalic.     Nose: Nose normal.     Mouth/Throat:     Mouth: Mucous  membranes are moist.  Eyes:     Extraocular Movements: Extraocular movements intact.     Pupils: Pupils are equal, round, and reactive to light.  Cardiovascular:     Rate and Rhythm: Regular rhythm. Bradycardia present.     Pulses: Normal pulses.  Pulmonary:     Effort: Pulmonary effort is normal.     Breath sounds: Normal breath sounds.  Abdominal:     General: Abdomen is flat.  Musculoskeletal:        General: Normal range of motion.     Cervical back: Normal range of motion.  Skin:    General: Skin is warm.  Neurological:     Comments: R sided contracture (chronic), able to move left side      ED Results / Procedures / Treatments   Labs (all labs ordered are listed, but only abnormal results are displayed) Labs Reviewed  CBC WITH DIFFERENTIAL/PLATELET - Abnormal; Notable for the following components:      Result Value   WBC 3.8 (*)    RBC 4.14 (*)    MCV 101.9 (*)    Platelets 121 (*)    All other components within normal limits  COMPREHENSIVE METABOLIC PANEL - Abnormal; Notable for the following components:   Creatinine, Ser 1.40 (*)    Calcium 8.5 (*)    GFR calc non Af Amer 46 (*)    GFR calc Af Amer 54 (*)    All other components within normal limits  I-STAT CHEM 8, ED - Abnormal; Notable for the following components:   BUN 25 (*)    Creatinine, Ser 1.40 (*)    Calcium, Ion 1.07 (*)    All other components within normal limits  SARS CORONAVIRUS 2 (TAT 6-24 HRS)  BRAIN NATRIURETIC PEPTIDE  POC SARS CORONAVIRUS 2 AG -  ED  TROPONIN I (HIGH SENSITIVITY)  TROPONIN I (HIGH SENSITIVITY)    EKG EKG Interpretation  Date/Time:  Friday August 23 2019 16:39:33 EST Ventricular Rate:  47 PR Interval:    QRS Duration: 101 QT Interval:  497 QTC Calculation: 440 R Axis:   -19 Text Interpretation: Sinus bradycardia Prolonged PR interval Borderline left axis deviation 1st degree AV block Confirmed by Wandra Arthurs [phone]) on 08/23/2019 4:44:37 PM   Radiology CT  Head Wo Contrast  Result Date: 08/23/2019 CLINICAL DATA:  Apnea EXAM: CT HEAD WITHOUT CONTRAST TECHNIQUE: Contiguous axial images were obtained from the base of the skull through the vertex without intravenous contrast. COMPARISON:  None. FINDINGS: Brain: No acute territorial infarction, hemorrhage, or intracranial mass. Extensive hypodensity within the periventricular, subcortical and deep white matter likely chronic small vessel ischemic change. Age indeterminate lacunar infarct left  basal ganglia. Mild atrophy. Nonenlarged ventricles Vascular: No hyperdense vessels.  Carotid vascular calcification Skull: Normal. Negative for fracture or focal lesion. Sinuses/Orbits: No acute finding. Other: None IMPRESSION: 1. Negative for intracranial hemorrhage or intracranial mass. 2. Age indeterminate lacunar infarct left basal ganglia 3. Atrophy and extensive hypodensity in the white matter, likely chronic small vessel ischemic change Electronically Signed   By: Jasmine Pang M.D.   On: 08/23/2019 19:54   DG Chest Port 1 View  Result Date: 08/23/2019 CLINICAL DATA:  Shortness of breath EXAM: PORTABLE CHEST 1 VIEW COMPARISON:  08/02/2018 FINDINGS: Low lung volumes. Mild cardiomegaly without consolidation, pleural effusion or pneumothorax. Aortic atherosclerosis. IMPRESSION: No active disease.  Mild cardiomegaly Electronically Signed   By: Jasmine Pang M.D.   On: 08/23/2019 17:46    Procedures Procedures (including critical care time)  Medications Ordered in ED Medications - No data to display  ED Course  I have reviewed the triage vital signs and the nursing notes.  Pertinent labs & imaging results that were available during my care of the patient were reviewed by me and considered in my medical decision making (see chart for details).    MDM Rules/Calculators/A&P                     Steven Kent is a 85 y.o. male here presenting with apneic episode and bradycardia.  I wonder if he had a  symptomatic bradycardia episode.  Patient is awake and alert currently.  Patient has no complaint.  His heart rate is in the 40s and EKG showed first-degree AV block. Will get labs, BNP, Trop, CXR. Will monitor closely for arrhythmias   6 pm Wife came and states that he was slumped over and stopped breathing briefly. Unclear if he had a seizure or not. Remote hx of seizures. Talked to Dr. Amada Jupiter who states that he likely had symptomatic bradycardia but recommend EEG. If EEG showed seizure, then neuro will see patient   8:23 PM Patient's CT showed age indeterminate lacunar infarct. EEG pending. HR down to upper 30s. Will admit for symptomatic bradycardia.    Final Clinical Impression(s) / ED Diagnoses Final diagnoses:  None    Rx / DC Orders ED Discharge Orders    None       Charlynne Pander, MD 08/23/19 2023

## 2019-08-24 ENCOUNTER — Other Ambulatory Visit: Payer: Self-pay | Admitting: Family Medicine

## 2019-08-24 ENCOUNTER — Inpatient Hospital Stay (HOSPITAL_COMMUNITY): Payer: Medicare Other

## 2019-08-24 ENCOUNTER — Observation Stay (HOSPITAL_BASED_OUTPATIENT_CLINIC_OR_DEPARTMENT_OTHER): Payer: Medicare Other

## 2019-08-24 ENCOUNTER — Encounter (HOSPITAL_COMMUNITY): Payer: Self-pay | Admitting: Internal Medicine

## 2019-08-24 DIAGNOSIS — H5461 Unqualified visual loss, right eye, normal vision left eye: Secondary | ICD-10-CM | POA: Diagnosis present

## 2019-08-24 DIAGNOSIS — Z87891 Personal history of nicotine dependence: Secondary | ICD-10-CM | POA: Diagnosis not present

## 2019-08-24 DIAGNOSIS — I2510 Atherosclerotic heart disease of native coronary artery without angina pectoris: Secondary | ICD-10-CM | POA: Diagnosis present

## 2019-08-24 DIAGNOSIS — R001 Bradycardia, unspecified: Principal | ICD-10-CM

## 2019-08-24 DIAGNOSIS — I4891 Unspecified atrial fibrillation: Secondary | ICD-10-CM | POA: Diagnosis present

## 2019-08-24 DIAGNOSIS — I371 Nonrheumatic pulmonary valve insufficiency: Secondary | ICD-10-CM

## 2019-08-24 DIAGNOSIS — I69351 Hemiplegia and hemiparesis following cerebral infarction affecting right dominant side: Secondary | ICD-10-CM

## 2019-08-24 DIAGNOSIS — R404 Transient alteration of awareness: Secondary | ICD-10-CM | POA: Diagnosis present

## 2019-08-24 DIAGNOSIS — R4189 Other symptoms and signs involving cognitive functions and awareness: Secondary | ICD-10-CM | POA: Diagnosis present

## 2019-08-24 DIAGNOSIS — I959 Hypotension, unspecified: Secondary | ICD-10-CM | POA: Diagnosis present

## 2019-08-24 DIAGNOSIS — Z886 Allergy status to analgesic agent status: Secondary | ICD-10-CM | POA: Diagnosis not present

## 2019-08-24 DIAGNOSIS — Z993 Dependence on wheelchair: Secondary | ICD-10-CM | POA: Diagnosis not present

## 2019-08-24 DIAGNOSIS — I351 Nonrheumatic aortic (valve) insufficiency: Secondary | ICD-10-CM

## 2019-08-24 DIAGNOSIS — I714 Abdominal aortic aneurysm, without rupture: Secondary | ICD-10-CM | POA: Diagnosis present

## 2019-08-24 DIAGNOSIS — Z79899 Other long term (current) drug therapy: Secondary | ICD-10-CM | POA: Diagnosis not present

## 2019-08-24 DIAGNOSIS — N182 Chronic kidney disease, stage 2 (mild): Secondary | ICD-10-CM | POA: Diagnosis present

## 2019-08-24 DIAGNOSIS — D696 Thrombocytopenia, unspecified: Secondary | ICD-10-CM

## 2019-08-24 DIAGNOSIS — Z20822 Contact with and (suspected) exposure to covid-19: Secondary | ICD-10-CM | POA: Diagnosis present

## 2019-08-24 DIAGNOSIS — I10 Essential (primary) hypertension: Secondary | ICD-10-CM

## 2019-08-24 DIAGNOSIS — I129 Hypertensive chronic kidney disease with stage 1 through stage 4 chronic kidney disease, or unspecified chronic kidney disease: Secondary | ICD-10-CM | POA: Diagnosis present

## 2019-08-24 DIAGNOSIS — I440 Atrioventricular block, first degree: Secondary | ICD-10-CM | POA: Diagnosis present

## 2019-08-24 DIAGNOSIS — Z7901 Long term (current) use of anticoagulants: Secondary | ICD-10-CM | POA: Diagnosis not present

## 2019-08-24 DIAGNOSIS — Z86718 Personal history of other venous thrombosis and embolism: Secondary | ICD-10-CM | POA: Diagnosis not present

## 2019-08-24 DIAGNOSIS — D72819 Decreased white blood cell count, unspecified: Secondary | ICD-10-CM | POA: Diagnosis present

## 2019-08-24 LAB — CBC WITH DIFFERENTIAL/PLATELET
Abs Immature Granulocytes: 0.01 10*3/uL (ref 0.00–0.07)
Basophils Absolute: 0 10*3/uL (ref 0.0–0.1)
Basophils Relative: 1 %
Eosinophils Absolute: 0.1 10*3/uL (ref 0.0–0.5)
Eosinophils Relative: 2 %
HCT: 48.5 % (ref 39.0–52.0)
Hemoglobin: 15.6 g/dL (ref 13.0–17.0)
Immature Granulocytes: 0 %
Lymphocytes Relative: 39 %
Lymphs Abs: 2.2 10*3/uL (ref 0.7–4.0)
MCH: 32 pg (ref 26.0–34.0)
MCHC: 32.2 g/dL (ref 30.0–36.0)
MCV: 99.4 fL (ref 80.0–100.0)
Monocytes Absolute: 0.5 10*3/uL (ref 0.1–1.0)
Monocytes Relative: 9 %
Neutro Abs: 2.7 10*3/uL (ref 1.7–7.7)
Neutrophils Relative %: 49 %
Platelets: 119 10*3/uL — ABNORMAL LOW (ref 150–400)
RBC: 4.88 MIL/uL (ref 4.22–5.81)
RDW: 14.3 % (ref 11.5–15.5)
WBC: 5.5 10*3/uL (ref 4.0–10.5)
nRBC: 0 % (ref 0.0–0.2)

## 2019-08-24 LAB — COMPREHENSIVE METABOLIC PANEL
ALT: 12 U/L (ref 0–44)
AST: 21 U/L (ref 15–41)
Albumin: 3.8 g/dL (ref 3.5–5.0)
Alkaline Phosphatase: 59 U/L (ref 38–126)
Anion gap: 10 (ref 5–15)
BUN: 16 mg/dL (ref 8–23)
CO2: 27 mmol/L (ref 22–32)
Calcium: 9.2 mg/dL (ref 8.9–10.3)
Chloride: 104 mmol/L (ref 98–111)
Creatinine, Ser: 1.2 mg/dL (ref 0.61–1.24)
GFR calc Af Amer: >60 mL/min (ref >60)
GFR calc non Af Amer: 56 mL/min — ABNORMAL LOW (ref >60)
Glucose, Bld: 102 mg/dL — ABNORMAL HIGH (ref 70–99)
Potassium: 3.7 mmol/L (ref 3.5–5.1)
Sodium: 141 mmol/L (ref 135–145)
Total Bilirubin: 0.9 mg/dL (ref 0.3–1.2)
Total Protein: 7.7 g/dL (ref 6.5–8.1)

## 2019-08-24 LAB — ECHOCARDIOGRAM COMPLETE
Height: 73.5 in
Weight: 3008 oz

## 2019-08-24 LAB — TSH: TSH: 1.921 u[IU]/mL (ref 0.350–4.500)

## 2019-08-24 LAB — MAGNESIUM: Magnesium: 2.2 mg/dL (ref 1.7–2.4)

## 2019-08-24 LAB — SARS CORONAVIRUS 2 (TAT 6-24 HRS): SARS Coronavirus 2: NEGATIVE

## 2019-08-24 MED ORDER — ONDANSETRON HCL 4 MG/2ML IJ SOLN: 4.0000 mg | Freq: Four times a day (QID) | INTRAMUSCULAR | Status: DC | PRN

## 2019-08-24 MED ORDER — GABAPENTIN 400 MG PO CAPS
400.0000 mg | ORAL_CAPSULE | Freq: Two times a day (BID) | ORAL | Status: DC
  Administered 2019-08-24 – 2019-08-25 (×3): 400 mg via ORAL
  Filled 2019-08-24 (×3): qty 1

## 2019-08-24 MED ORDER — ACETAMINOPHEN 325 MG PO TABS: 650.0000 mg | ORAL_TABLET | Freq: Four times a day (QID) | ORAL | Status: DC | PRN

## 2019-08-24 MED ORDER — OXYBUTYNIN CHLORIDE 5 MG PO TABS
5.0000 mg | ORAL_TABLET | Freq: Two times a day (BID) | ORAL | Status: DC
  Administered 2019-08-24 – 2019-08-25 (×3): 5 mg via ORAL
  Filled 2019-08-24 (×3): qty 1

## 2019-08-24 MED ORDER — LATANOPROST 0.005 % OP SOLN
1.0000 [drp] | Freq: Every day | OPHTHALMIC | Status: DC
  Administered 2019-08-24: 22:00:00 1 [drp] via OPHTHALMIC
  Filled 2019-08-24: qty 2.5

## 2019-08-24 MED ORDER — HYDRALAZINE HCL 10 MG PO TABS: 10.0000 mg | ORAL_TABLET | Freq: Three times a day (TID) | ORAL | Status: DC | PRN

## 2019-08-24 MED ORDER — AMLODIPINE BESYLATE 2.5 MG PO TABS: 2.5000 mg | ORAL_TABLET | Freq: Every evening | ORAL | Status: DC

## 2019-08-24 MED ORDER — ONDANSETRON HCL 4 MG PO TABS: 4.0000 mg | ORAL_TABLET | Freq: Four times a day (QID) | ORAL | Status: DC | PRN

## 2019-08-24 MED ORDER — VITAMIN D (ERGOCALCIFEROL) 1.25 MG (50000 UNIT) PO CAPS: 50000.0000 [IU] | ORAL_CAPSULE | ORAL | Status: DC

## 2019-08-24 MED ORDER — DORZOLAMIDE HCL 2 % OP SOLN
1.0000 [drp] | Freq: Two times a day (BID) | OPHTHALMIC | Status: DC
  Administered 2019-08-24 – 2019-08-25 (×3): 1 [drp] via OPHTHALMIC
  Filled 2019-08-24: qty 10

## 2019-08-24 MED ORDER — ACETAMINOPHEN 650 MG RE SUPP: 650.0000 mg | Freq: Four times a day (QID) | RECTAL | Status: DC | PRN

## 2019-08-24 MED ORDER — BRIMONIDINE TARTRATE 0.15 % OP SOLN
1.0000 [drp] | Freq: Every day | OPHTHALMIC | Status: DC
  Administered 2019-08-24 – 2019-08-25 (×2): 1 [drp] via OPHTHALMIC
  Filled 2019-08-24: qty 5

## 2019-08-24 MED ORDER — TORSEMIDE 20 MG PO TABS
10.0000 mg | ORAL_TABLET | Freq: Every day | ORAL | Status: DC
  Administered 2019-08-24 – 2019-08-25 (×2): 10 mg via ORAL
  Filled 2019-08-24 (×2): qty 1

## 2019-08-24 MED ORDER — ATORVASTATIN CALCIUM 40 MG PO TABS
40.0000 mg | ORAL_TABLET | Freq: Every evening | ORAL | Status: DC
  Administered 2019-08-24: 40 mg via ORAL
  Filled 2019-08-24: qty 1

## 2019-08-24 MED ORDER — RIVAROXABAN 20 MG PO TABS
20.0000 mg | ORAL_TABLET | Freq: Every evening | ORAL | Status: DC
  Administered 2019-08-24: 19:00:00 20 mg via ORAL
  Filled 2019-08-24: qty 1

## 2019-08-24 NOTE — H&P (Signed)
History and Physical    Kilian Schwartz DDU:[id number] DOB: 11-17-1934 DOA: 08/23/2019  PCP: Ardith Dark, MD  Patient coming from: Home.  Chief Complaint: Unresponsive episode.  ER physician provided history.  HPI: Steven Kent is a 85 y.o. male with history of hypertension DVT stroke with right-sided weakness who had an unresponsive episode at home for several minutes and EMS was called.  EMS on arrival patient is on heart rate awake but was bradycardic with a heart rate in the 40s.  Mildly hypotensive was given fluid bolus and was brought to the ER.  ED Course: In the ER patient is alert awake oriented and following all commands.  But heart rate on the monitor was at times dropping to the 30s sinus bradycardia.  ER physician also discussed with on-call neurologist who advised to get a EEG after CT head was normal.  If EEG is abnormal to consult neurology.  Labs show creatinine 1.4 at baseline.  High-sensitivity troponin is negative WBC count was 3.8 and platelets 121.  Covid test was negative.  Review of Systems: As per HPI, rest all negative.   Past Medical History:  Diagnosis Date  . Abdominal aortic aneurysm (AAA) (HCC)   . Hypertension   . Stroke [facility])     History reviewed. No pertinent surgical history.   reports that he has quit smoking. He has never used smokeless tobacco. He reports that he does not drink alcohol or use drugs.  Allergies  Allergen Reactions  . Aspirin Other (See Comments)    Full Strength Asprin-325 mg causes upset stomach. Can take 81 mg.    Family History  Problem Relation Age of Onset  . Prostate cancer Neg Hx     Prior to Admission medications   Medication Sig Start Date End Date Taking? Authorizing Provider  amLODipine (NORVASC) 2.5 MG tablet Take 1 tablet (2.5 mg total) by mouth every evening. 08/16/18  Yes Ardith Dark, MD  atorvastatin (LIPITOR) 40 MG tablet Take 1 tablet (40 mg total) by mouth every evening. 08/16/18  Yes  Ardith Dark, MD  brimonidine (ALPHAGAN) 0.15 % ophthalmic solution Place 1 drop into both eyes daily.   Yes [provider]  dorzolamide (TRUSOPT) 2 % ophthalmic solution Place 1 drop into both eyes 2 (two) times daily.   Yes [provider]  gabapentin (NEURONTIN) 400 MG capsule Take 1 capsule (400 mg total) by mouth 2 (two) times daily. 08/16/18  Yes Ardith Dark, MD  latanoprost (XALATAN) 0.005 % ophthalmic solution Place 1 drop into both eyes at bedtime.   Yes [provider]  oxybutynin (DITROPAN) 5 MG tablet Take 1 tablet (5 mg total) by mouth 2 (two) times daily. 08/16/18  Yes Ardith Dark, MD  rivaroxaban (XARELTO) 20 MG TABS tablet Take 1 tablet (20 mg total) by mouth every evening. 08/16/18  Yes Ardith Dark, MD  torsemide (DEMADEX) 10 MG tablet Take 1 tablet (10 mg total) by mouth daily. 02/27/19  Yes Ardith Dark, MD  Vitamin D, Ergocalciferol, (DRISDOL) 1.25 MG (50000 UT) CAPS capsule Take 1 capsule (50,000 Units total) by mouth every 7 (seven) days. 01/09/19  Yes Ardith Dark, MD    Physical Exam: Constitutional: Moderately built and nourished. Vitals:   08/23/19 1945 08/23/19 2030 08/23/19 2115 08/23/19 2208  BP: 124/73 129/69 129/67 (!) 146/93  Pulse:    68  Resp: 14 10 13 15   Temp:    97.8 F (36.6 C)  TempSrc:  Oral  SpO2:    100%   Eyes: Anicteric no pallor. ENMT: No discharge from the ears eyes nose or mouth. Neck: No mass felt.  No neck rigidity. Respiratory: No rhonchi or crepitations. Cardiovascular: S1-S2 heard. Abdomen: Soft nontender bowel sounds present. Musculoskeletal: No edema. Skin: No rash. Neurologic: Alert awake oriented to time place and person.  Mild weakness of the right side. Psychiatric: Appears normal.   Labs on Admission: I have personally reviewed following labs and imaging studies  CBC: Recent Labs  Lab 08/23/19 1655 08/23/19 1705  WBC 3.8*  --   NEUTROABS 2.1  --   HGB 13.3 13.6  HCT  42.2 40.0  MCV 101.9*  --   PLT 121*  --    Basic Metabolic Panel: Recent Labs  Lab 08/23/19 1655 08/23/19 1705  NA 135 138  K 4.2 4.1  CL 100 101  CO2 26  --   GLUCOSE 96 92  BUN 21 25*  CREATININE 1.40* 1.40*  CALCIUM 8.5*  --    GFR: CrCl cannot be calculated (Unknown ideal weight.). Liver Function Tests: Recent Labs  Lab 08/23/19 1655  AST 22  ALT 11  ALKPHOS 44  BILITOT 1.0  PROT 6.5  ALBUMIN 3.5   No results for input(s): LIPASE, AMYLASE in the last 168 hours. No results for input(s): AMMONIA in the last 168 hours. Coagulation Profile: No results for input(s): INR, PROTIME in the last 168 hours. Cardiac Enzymes: No results for input(s): CKTOTAL, CKMB, CKMBINDEX, TROPONINI in the last 168 hours. BNP (last 3 results) No results for input(s): PROBNP in the last 8760 hours. HbA1C: No results for input(s): HGBA1C in the last 72 hours. CBG: No results for input(s): GLUCAP in the last 168 hours. Lipid Profile: No results for input(s): CHOL, HDL, LDLCALC, TRIG, CHOLHDL, LDLDIRECT in the last 72 hours. Thyroid Function Tests: No results for input(s): TSH, T4TOTAL, FREET4, T3FREE, THYROIDAB in the last 72 hours. Anemia Panel: No results for input(s): VITAMINB12, FOLATE, FERRITIN, TIBC, IRON, RETICCTPCT in the last 72 hours. Urine analysis:    Component Value Date/Time   COLORURINE YELLOW 08/02/2018 1408   APPEARANCEUR HAZY (A) 08/02/2018 1408   LABSPEC 1.011 08/02/2018 1408   PHURINE 8.0 08/02/2018 1408   GLUCOSEU NEGATIVE 08/02/2018 1408   HGBUR NEGATIVE 08/02/2018 1408   BILIRUBINUR NEGATIVE 08/02/2018 1408   KETONESUR NEGATIVE 08/02/2018 1408   PROTEINUR NEGATIVE 08/02/2018 1408   NITRITE NEGATIVE 08/02/2018 1408   LEUKOCYTESUR NEGATIVE 08/02/2018 1408   Sepsis Labs: @LABRCNTIP (procalcitonin:4,lacticidven:4) ) Recent Results (from the past 240 hour(s))  SARS CORONAVIRUS 2 (TAT 6-24 HRS) Nasopharyngeal Nasopharyngeal Swab     Status: None    Collection Time: 08/23/19  6:53 PM   Specimen: Nasopharyngeal Swab  Result Value Ref Range Status   SARS Coronavirus 2 NEGATIVE NEGATIVE Final    Comment: (NOTE) SARS-CoV-2 target nucleic acids are NOT DETECTED. The SARS-CoV-2 RNA is generally detectable in upper and lower respiratory specimens during the acute phase of infection. Negative results do not preclude SARS-CoV-2 infection, do not rule out co-infections with other pathogens, and should not be used as the sole basis for treatment or other patient management decisions. Negative results must be combined with clinical observations, patient history, and epidemiological information. The expected result is Negative. Fact Sheet for Patients: 08/25/19 Fact Sheet for Healthcare Providers: HairSlick.no This test is not yet approved or cleared by the quierodirigir.com FDA and  has been authorized for detection and/or diagnosis of SARS-CoV-2 by FDA under  an Emergency Use Authorization (EUA). This EUA will remain  in effect (meaning this test can be used) for the duration of the COVID-19 declaration under Section 56 4(b)(1) of the Act, 21 U.S.C. section 360bbb-3(b)(1), unless the authorization is terminated or revoked sooner. Performed at [facility], [place] [address] [place], [place] [address]      Radiological Exams on Admission: CT Head Wo Contrast  Result Date: 08/23/2019 CLINICAL DATA:  Apnea EXAM: CT HEAD WITHOUT CONTRAST TECHNIQUE: Contiguous axial images were obtained from the base of the skull through the vertex without intravenous contrast. COMPARISON:  None. FINDINGS: Brain: No acute territorial infarction, hemorrhage, or intracranial mass. Extensive hypodensity within the periventricular, subcortical and deep white matter likely chronic small vessel ischemic change. Age indeterminate lacunar infarct left basal ganglia. Mild atrophy. Nonenlarged ventricles  Vascular: No hyperdense vessels.  Carotid vascular calcification Skull: Normal. Negative for fracture or focal lesion. Sinuses/Orbits: No acute finding. Other: None IMPRESSION: 1. Negative for intracranial hemorrhage or intracranial mass. 2. Age indeterminate lacunar infarct left basal ganglia 3. Atrophy and extensive hypodensity in the white matter, likely chronic small vessel ischemic change Electronically Signed   By: Donavan Foil M.D.   On: 08/23/2019 19:54   DG Chest Port 1 View  Result Date: 08/23/2019 CLINICAL DATA:  Shortness of breath EXAM: PORTABLE CHEST 1 VIEW COMPARISON:  08/02/2018 FINDINGS: Low lung volumes. Mild cardiomegaly without consolidation, pleural effusion or pneumothorax. Aortic atherosclerosis. IMPRESSION: No active disease.  Mild cardiomegaly Electronically Signed   By: Donavan Foil M.D.   On: 08/23/2019 17:46    EKG: Independently reviewed.  Sinus bradycardia.  Assessment/Plan Principal Problem:   Unresponsive episode Active Problems:   Hemiparesis affecting right side as late effect of cerebrovascular accident ([place])   Abdominal aortic aneurysm (AAA) ([place])   Essential hypertension   Hypertensive kidney disease with chronic kidney disease stage III   Coronary artery disease involving native heart without angina pectoris   Bradycardia    1. Near syncopal episode/unresponsive episode with patient having sinus bradycardia likely causing the symptoms.  Will closely monitor and telemetry.  Consult cardiology in the morning.  Reviewing patient's chart patient has had near syncopal episodes when patient used to live in [place] many years ago.  Since patient also was found to be unresponsive neurology has recommended EEG. 2. Hypertension on amlodipine. 3. History of stroke on statins and Xarelto. 4. History of DVT on Xarelto. 5. Leukopenia and thrombocytopenia appears to be chronic.  Follow CBC. 6. History of abdominal aortic aneurysm denies any abdominal pain.   DVT  prophylaxis: Xarelto. Code Status: Full code. Family Communication: We will need to discuss with family. Disposition Plan: Home. Consults called: ER physician discussed with neurology. Admission status: Observation.   Rise Patience MD [facility] Pager [phone].  If 7PM-7AM, please contact night-coverage [email or web address] Password [email or web address]  08/24/2019, 4:02 AM

## 2019-08-24 NOTE — Progress Notes (Signed)
PROGRESS NOTE  Steven Kent GUY:[id number] DOB: 03/01/1935 DOA: 08/23/2019 PCP: Vivi Barrack, MD  HPI/Recap of past 24 hours:  No syncope episode since in the hospital, he is sitting up on the edge of the bed, denies pain, no dizziness  Assessment/Plan: Principal Problem:   Unresponsive episode Active Problems:   Hemiparesis affecting right side as late effect of cerebrovascular accident [facility])   Abdominal aortic aneurysm (AAA) ([place])   Essential hypertension   Hypertensive kidney disease with chronic kidney disease stage III   Coronary artery disease involving native heart without angina pectoris   Bradycardia  Recurrent syncope near syncope episode with bradycardia -Continue telemetry , tele has shown sinus bradycardia with occasional PVCs -Troponin negative, TSH 1.9 -CT head no acute findings -Echo pending -EEG ordered on presentation, not done yet -Awaiting orthostatic vital signs  -Cardiology consulted ,input appreciated, will follow recommendation  HTN: stable, monitor  CKD 2; close to baseline   History of CVA, with residual right hemiparesis, on statin and Xarelto, walks with a walker/wheelchair at baseline, from home with family  History of DVT, on Xarelto, on Demadex chronically, (presumably for lower extremity edema), this is continued.  Thrombocytopenia, appear chronic, stable at baseline  History of AAA, denies abdominal pain  History of right eye blindness  DVT Prophylaxis: Xarelto  Code Status: Full  Family Communication: patient   Disposition Plan: Possible home with home health tomorrow if cleared by cardiology   Consultants:  Cardiology  Procedures:  None  Antibiotics:  None   Objective: BP (!) 151/87 (BP Location: Left Arm)   Pulse 66   Temp 98.7 F (37.1 C) (Oral)   Resp 16   Ht 6' 1.5" (1.867 m)   Wt 85.3 kg   SpO2 100%   BMI 24.47 kg/m   Intake/Output Summary (Last 24 hours) at 08/24/2019 1441 Last data filed  at 08/24/2019 1142 Gross per 24 hour  Intake 360 ml  Output 1900 ml  Net -1540 ml   Filed Weights   08/24/19 0447  Weight: 85.3 kg    Exam: Patient is for lower extremity edema including today on 08/24/2019, exams remain the same as of yesterday except that has changed    General:  NAD  Cardiovascular: RRR  Respiratory: CTABL  Abdomen: Soft/ND/NT, positive BS  Musculoskeletal: Chronic lower extremity edema  Neuro: alert, oriented , right hemiparesis, right eye blind  Data Reviewed: Basic Metabolic Panel: Recent Labs  Lab 08/23/19 1655 08/23/19 1705 08/24/19 0459  NA 135 138 141  K 4.2 4.1 3.7  CL 100 101 104  CO2 26  --  27  GLUCOSE 96 92 102*  BUN 21 25* 16  CREATININE 1.40* 1.40* 1.20  CALCIUM 8.5*  --  9.2  MG  --   --  2.2   Liver Function Tests: Recent Labs  Lab 08/23/19 1655 08/24/19 0459  AST 22 21  ALT 11 12  ALKPHOS 44 59  BILITOT 1.0 0.9  PROT 6.5 7.7  ALBUMIN 3.5 3.8   No results for input(s): LIPASE, AMYLASE in the last 168 hours. No results for input(s): AMMONIA in the last 168 hours. CBC: Recent Labs  Lab 08/23/19 1655 08/23/19 1705 08/24/19 0459  WBC 3.8*  --  5.5  NEUTROABS 2.1  --  2.7  HGB 13.3 13.6 15.6  HCT 42.2 40.0 48.5  MCV 101.9*  --  99.4  PLT 121*  --  119*   Cardiac Enzymes:   No results for input(s): CKTOTAL,  CKMB, CKMBINDEX, TROPONINI in the last 168 hours. BNP (last 3 results) Recent Labs    08/23/19 1655  BNP 41.3    ProBNP (last 3 results) No results for input(s): PROBNP in the last 8760 hours.  CBG: No results for input(s): GLUCAP in the last 168 hours.  Recent Results (from the past 240 hour(s))  SARS CORONAVIRUS 2 (TAT 6-24 HRS) Nasopharyngeal Nasopharyngeal Swab     Status: None   Collection Time: 08/23/19  6:53 PM   Specimen: Nasopharyngeal Swab  Result Value Ref Range Status   SARS Coronavirus 2 NEGATIVE NEGATIVE Final    Comment: (NOTE) SARS-CoV-2 target nucleic acids are NOT  DETECTED. The SARS-CoV-2 RNA is generally detectable in upper and lower respiratory specimens during the acute phase of infection. Negative results do not preclude SARS-CoV-2 infection, do not rule out co-infections with other pathogens, and should not be used as the sole basis for treatment or other patient management decisions. Negative results must be combined with clinical observations, patient history, and epidemiological information. The expected result is Negative. Fact Sheet for Patients: HairSlick.no Fact Sheet for Healthcare Providers: quierodirigir.com This test is not yet approved or cleared by the [place] FDA and  has been authorized for detection and/or diagnosis of SARS-CoV-2 by FDA under an Emergency Use Authorization (EUA). This EUA will remain  in effect (meaning this test can be used) for the duration of the COVID-19 declaration under Section 56 4(b)(1) of the Act, 21 U.S.C. section 360bbb-3(b)(1), unless the authorization is terminated or revoked sooner. Performed at [facility], [address] [place], [place] [address]      Studies: CT Head Wo Contrast  Result Date: 08/23/2019 CLINICAL DATA:  Apnea EXAM: CT HEAD WITHOUT CONTRAST TECHNIQUE: Contiguous axial images were obtained from the base of the skull through the vertex without intravenous contrast. COMPARISON:  None. FINDINGS: Brain: No acute territorial infarction, hemorrhage, or intracranial mass. Extensive hypodensity within the periventricular, subcortical and deep white matter likely chronic small vessel ischemic change. Age indeterminate lacunar infarct left basal ganglia. Mild atrophy. Nonenlarged ventricles Vascular: No hyperdense vessels.  Carotid vascular calcification Skull: Normal. Negative for fracture or focal lesion. Sinuses/Orbits: No acute finding. Other: None IMPRESSION: 1. Negative for intracranial hemorrhage or intracranial  mass. 2. Age indeterminate lacunar infarct left basal ganglia 3. Atrophy and extensive hypodensity in the white matter, likely chronic small vessel ischemic change Electronically Signed   By: Jasmine Pang M.D.   On: 08/23/2019 19:54   DG Chest Port 1 View  Result Date: 08/23/2019 CLINICAL DATA:  Shortness of breath EXAM: PORTABLE CHEST 1 VIEW COMPARISON:  08/02/2018 FINDINGS: Low lung volumes. Mild cardiomegaly without consolidation, pleural effusion or pneumothorax. Aortic atherosclerosis. IMPRESSION: No active disease.  Mild cardiomegaly Electronically Signed   By: Jasmine Pang M.D.   On: 08/23/2019 17:46   ECHOCARDIOGRAM COMPLETE  Result Date: 08/24/2019   ECHOCARDIOGRAM REPORT   Patient Name:   Steven Kent Date of Exam: 08/24/2019 Medical Rec #:  [id number]        Height:       73.5 in Accession #:    [id number]       Weight:       188.0 lb Date of Birth:  08-10-1934       BSA:          2.11 m Patient Age:    84 years         BP:  148/85 mmHg Patient Gender: M                HR:           66 bpm. Exam Location:  Inpatient Procedure: 2D Echo, Cardiac Doppler and Color Doppler Indications:    Syncope  History:        Patient has prior history of Echocardiogram examinations, most                 recent 12/17/2018. Stroke, Arrythmias:Bradycardia; Risk                 Factors:Hypertension.  Sonographer:    Lavenia Atlas Referring Phys: [phone] ARSHAD N KAKRAKANDY IMPRESSIONS  1. Left ventricular ejection fraction, by visual estimation, is 55 to 60%. The left ventricle has normal function. There is moderately increased left ventricular hypertrophy.  2. Basal inferolateral segment is abnormal.  3. Left ventricular diastolic parameters are consistent with Grade I diastolic dysfunction (impaired relaxation).  4. The left ventricle demonstrates regional wall motion abnormalities.  5. Global right ventricle has normal systolic function.The right ventricular size is normal. No increase in right  ventricular wall thickness.  6. Left atrial size was normal.  7. Right atrial size was normal.  8. Mild mitral annular calcification.  9. The mitral valve is grossly normal. No evidence of mitral valve regurgitation. 10. The tricuspid valve is grossly normal. 11. The tricuspid valve is grossly normal. Tricuspid valve regurgitation is trivial. 12. The aortic valve is tricuspid. Aortic valve regurgitation is mild. Mild aortic valve sclerosis without stenosis. 13. Pulmonic regurgitation is mild. 14. The pulmonic valve was grossly normal. Pulmonic valve regurgitation is mild. 15. Aortic dilatation noted. 16. There is mild dilatation of the aortic root. 17. Normal pulmonary artery systolic pressure. 18. The interatrial septum was not well visualized. FINDINGS  Left Ventricle: Left ventricular ejection fraction, by visual estimation, is 55 to 60%. The left ventricle has normal function. The left ventricle demonstrates regional wall motion abnormalities. The left ventricular internal cavity size was the left ventricle is normal in size. There is moderately increased left ventricular hypertrophy. Concentric left ventricular hypertrophy. Left ventricular diastolic parameters are consistent with Grade I diastolic dysfunction (impaired relaxation). Indeterminate  filling pressures.  LV Wall Scoring: The basal inferolateral segment is hypokinetic. Right Ventricle: The right ventricular size is normal. No increase in right ventricular wall thickness. Global RV systolic function is has normal systolic function. The tricuspid regurgitant velocity is 2.34 m/s, and with an assumed right atrial pressure  of 3 mmHg, the estimated right ventricular systolic pressure is normal at 24.9 mmHg. Left Atrium: Left atrial size was normal in size. Right Atrium: Right atrial size was normal in size Pericardium: There is no evidence of pericardial effusion. Mitral Valve: The mitral valve is grossly normal. There is mild thickening of the mitral  valve leaflet(s). Mild mitral annular calcification. No evidence of mitral valve regurgitation. Tricuspid Valve: The tricuspid valve is grossly normal. Tricuspid valve regurgitation is trivial. Aortic Valve: The aortic valve is tricuspid. Aortic valve regurgitation is mild. Aortic regurgitation PHT measures 1288 msec. Mild aortic valve sclerosis is present, with no evidence of aortic valve stenosis. Moderate aortic valve annular calcification. Aortic valve mean gradient measures 10.5 mmHg. Aortic valve peak gradient measures 19.2 mmHg. Aortic valve area, by VTI measures 2.69 cm. Pulmonic Valve: The pulmonic valve was grossly normal. Pulmonic valve regurgitation is mild. Pulmonic regurgitation is mild. Aorta: Aortic dilatation noted. There is mild dilatation of the aortic  root. Venous: The inferior vena cava was not well visualized. IAS/Shunts: The interatrial septum was not well visualized.  LEFT VENTRICLE PLAX 2D LVIDd:         4.00 cm  Diastology LVIDs:         3.00 cm  LV e' lateral:   7.94 cm/s LV PW:         1.60 cm  LV E/e' lateral: 7.9 LV IVS:        1.50 cm  LV e' medial:    5.22 cm/s LVOT diam:     2.50 cm  LV E/e' medial:  12.0 LV SV:         35 ml LV SV Index:   16.61 LVOT Area:     4.91 cm  RIGHT VENTRICLE RV Basal diam:  2.60 cm RV S prime:     10.70 cm/s TAPSE (M-mode): 2.3 cm LEFT ATRIUM             Index       RIGHT ATRIUM           Index LA diam:        4.10 cm 1.95 cm/m  RA Area:     16.70 cm LA Vol (A2C):   51.2 ml 24.31 ml/m RA Volume:   41.00 ml  19.46 ml/m LA Vol (A4C):   41.8 ml 19.84 ml/m LA Biplane Vol: 47.9 ml 22.74 ml/m  AORTIC VALVE AV Area (Vmax):    2.33 cm AV Area (Vmean):   2.46 cm AV Area (VTI):     2.69 cm AV Vmax:           219.00 cm/s AV Vmean:          149.500 cm/s AV VTI:            0.469 m AV Peak Grad:      19.2 mmHg AV Mean Grad:      10.5 mmHg LVOT Vmax:         104.00 cm/s LVOT Vmean:        74.800 cm/s LVOT VTI:          0.257 m LVOT/AV VTI ratio: 0.55 AI PHT:             1288 msec  AORTA Ao Root diam: 3.80 cm MITRAL VALVE                        TRICUSPID VALVE MV Area (PHT): 4.21 cm             TR Peak grad:   21.9 mmHg MV PHT:        52.20 msec           TR Vmax:        239.00 cm/s MV Decel Time: 180 msec MV E velocity: 62.40 cm/s 103 cm/s  SHUNTS MV A velocity: 88.10 cm/s 70.3 cm/s Systemic VTI:  0.26 m MV E/A ratio:  0.71       1.5       Systemic Diam: 2.50 cm  Prentice Docker MD Electronically signed by Prentice Docker MD Signature Date/Time: 08/24/2019/12:01:43 PM    Final     Scheduled Meds: . atorvastatin  40 mg Oral QPM  . brimonidine  1 drop Both Eyes Daily  . dorzolamide  1 drop Both Eyes BID  . gabapentin  400 mg Oral BID  . latanoprost  1 drop Both Eyes QHS  . oxybutynin  5 mg Oral  BID  . rivaroxaban  20 mg Oral QPM  . torsemide  10 mg Oral Daily  . [START ON 08/28/2019] Vitamin D (Ergocalciferol)  50,000 Units Oral Q7 days    Continuous Infusions:   Time spent: 35mins I have personally reviewed and interpreted on  08/24/2019 daily labs, tele strips, imagings as discussed above under date review session and assessment and plans.  I reviewed all nursing notes, pharmacy notes, consultant notes,  vitals, pertinent old records  I have discussed plan of care as described above with RN , patient on 08/24/2019   Albertine GratesFang Roosevelt Eimers MD, PhD, FACP  [facility]  Available via Epic secure chat 7am-7pm for nonurgent issues Please page for urgent issues, pager number available through [email or web address] .   08/24/2019, 2:41 PM  LOS: 0 days

## 2019-08-24 NOTE — Consult Note (Addendum)
Cardiology Consultation:   Patient ID: Steven Kent MRN: [id number]; DOB: 1934-09-19  Admit date: 08/23/2019 Date of Consult: 08/24/2019  Primary Care Provider: Vivi Barrack, MD Primary Cardiologist: Mertie Moores, MD   Primary Electrophysiologist:  None     Patient Profile:   Travon Crochet is a 85 y.o. 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 Hal Hope.  History of Present Illness:   Mr. Kapur moved to [place] from [place].  He was evaluated by Dr. Acie Fredrickson in March 2020 for heart murmur.  His echocardiogram demonstrated mild aortic insufficiency and normal LV function.  He did have a prior history of near syncope with reportedly negative heart monitor while living in [place].  At that time, he was doing well no further testing was necessary.  The patient was found unresponsive at home for several minutes and brought to the emergency room via EMS.  He was noted be bradycardic with heart rates in the 40s.  Head CT was negative for acute bleed.  Neurology recommended getting an EEG.  Cardiology is asked to further evaluate for bradycardia.  The patient notes he was at home yesterday and went to go lay down for a nap.  He does not report true syncope.  This morning, he feels well without dizziness.  He has not had chest discomfort, shortness of breath, orthopnea.  He has chronic right lower extremity swelling without change.   Prior cardiac studies Echocardiogram 12/17/2018 EF 55-60, mild LVH, grade 1 diastolic dysfunction, normal RV SF, mild BAE, mild MAC, mild AI  AAA ultrasound 10/02/2018 Summary: Abdominal Aorta: There is evidence of abnormal dilitation of the Distal Abdominal aorta. There is evidence of abnormal dilation of the Right Common Iliac artery and Left Common Iliac artery.    Past Medical  History:  Diagnosis Date  . Abdominal aortic aneurysm (AAA) ([place])   . Hypertension   . Stroke [facility])     History reviewed. No pertinent surgical history.   Home Medications:  Prior to Admission medications   Medication Sig Start Date End Date Taking? Authorizing Provider  amLODipine (NORVASC) 2.5 MG tablet Take 1 tablet (2.5 mg total) by mouth every evening. 08/16/18  Yes Vivi Barrack, MD  atorvastatin (LIPITOR) 40 MG tablet Take 1 tablet (40 mg total) by mouth every evening. 08/16/18  Yes Vivi Barrack, MD  brimonidine (ALPHAGAN) 0.15 % ophthalmic solution Place 1 drop into both eyes daily.   Yes [provider]  dorzolamide (TRUSOPT) 2 % ophthalmic solution Place 1 drop into both eyes 2 (two) times daily.   Yes [provider]  gabapentin (NEURONTIN) 400 MG capsule Take 1 capsule (400 mg total) by mouth 2 (two) times daily. 08/16/18  Yes Vivi Barrack, MD  latanoprost (XALATAN) 0.005 % ophthalmic solution Place 1 drop into both eyes at bedtime.   Yes [provider]  oxybutynin (DITROPAN) 5 MG tablet Take 1 tablet (5 mg total) by mouth 2 (two) times daily. 08/16/18  Yes Vivi Barrack, MD  rivaroxaban (XARELTO) 20 MG TABS tablet Take 1 tablet (20 mg total) by mouth every evening. 08/16/18  Yes Vivi Barrack, MD  torsemide (DEMADEX) 10 MG tablet Take 1 tablet (10 mg total) by mouth daily. 02/27/19  Yes Vivi Barrack, MD  Vitamin D, Ergocalciferol, (DRISDOL) 1.25 MG (50000 UT) CAPS capsule Take  1 capsule (50,000 Units total) by mouth every 7 (seven) days. 01/09/19  Yes Vivi Barrack, MD    Inpatient Medications: Scheduled Meds: . amLODipine  2.5 mg Oral QPM  . atorvastatin  40 mg Oral QPM  . brimonidine  1 drop Both Eyes Daily  . dorzolamide  1 drop Both Eyes BID  . gabapentin  400 mg Oral BID  . latanoprost  1 drop Both Eyes QHS  . oxybutynin  5 mg Oral BID  . rivaroxaban  20 mg Oral QPM  . torsemide  10 mg Oral Daily  . [START ON 08/28/2019]  Vitamin D (Ergocalciferol)  50,000 Units Oral Q7 days   Continuous Infusions:  PRN Meds: acetaminophen **OR** acetaminophen, ondansetron **OR** ondansetron (ZOFRAN) IV  Allergies:    Allergies  Allergen Reactions  . Aspirin Other (See Comments)    Full Strength Asprin-325 mg causes upset stomach. Can take 81 mg.    Social History:   Social History   Socioeconomic History  . Marital status: Married    Spouse name: Not on file  . Number of children: Not on file  . Years of education: Not on file  . Highest education level: Not on file  Occupational History  . Not on file  Tobacco Use  . Smoking status: Former Research scientist (life sciences)  . Smokeless tobacco: Never Used  Substance and Sexual Activity  . Alcohol use: Never  . Drug use: Never  . Sexual activity: Not on file  Other Topics Concern  . Not on file  Social History Narrative  . Not on file   Social Determinants of Health   Financial Resource Strain:   . Difficulty of Paying Living Expenses: Not on file  Food Insecurity:   . Worried About Charity fundraiser in the Last Year: Not on file  . Ran Out of Food in the Last Year: Not on file  Transportation Needs:   . Lack of Transportation (Medical): Not on file  . Lack of Transportation (Non-Medical): Not on file  Physical Activity:   . Days of Exercise per Week: Not on file  . Minutes of Exercise per Session: Not on file  Stress:   . Feeling of Stress : Not on file  Social Connections:   . Frequency of Communication with Friends and Family: Not on file  . Frequency of Social Gatherings with Friends and Family: Not on file  . Attends Religious Services: Not on file  . Active Member of Clubs or Organizations: Not on file  . Attends Archivist Meetings: Not on file  . Marital Status: Not on file  Intimate Partner Violence:   . Fear of Current or Ex-Partner: Not on file  . Emotionally Abused: Not on file  . Physically Abused: Not on file  . Sexually Abused: Not on  file    Family History:    Family History  Problem Relation Age of Onset  . Prostate cancer Neg Hx      ROS:  Please see the history of present illness.  No fevers, cough, melena, hematochezia. All other ROS reviewed and negative.     Physical Exam/Data:   Vitals:   08/23/19 2030 08/23/19 2115 08/23/19 2208 08/24/19 0447  BP: 129/69 129/67 (!) 146/93 (!) 148/85  Pulse:   68 66  Resp: _0 Temp:   97.8 F (36.6 C) 98.7 F (37.1 C)  TempSrc:   Oral Oral  SpO2:   100% 100%  Weight:  85.3 kg  Height:    6' 1.5" (1.867 m)    Intake/Output Summary (Last 24 hours) at 08/24/2019 1047 Last data filed at 08/24/2019 0700 Gross per 24 hour  Intake 360 ml  Output 1100 ml  Net -740 ml   Last 3 Weights 08/24/2019 02/12/2019 10/08/2018  Weight (lbs) 188 lb 192 lb 192 lb 1.9 oz  Weight (kg) 85.276 kg 87.091 kg 87.145 kg     Body mass index is 24.47 kg/m.  General:  Well nourished, well developed, in no acute distress HEENT: normal Lymph: no adenopathy Neck: no JVD Endocrine:  No thryomegaly Cardiac:  normal S1, S2; RRR; no murmur  Lungs:  clear to auscultation bilaterally, no wheezing, rhonchi or rales  Abd: soft, nontender Ext: 1+ right lower extremity edema Musculoskeletal: No deformity Skin: warm and dry  Neuro:  CNs 2-12 intact, right-sided weakness noted Psych:  Normal affect   EKG:  The EKG was personally reviewed and demonstrates: Sinus bradycardia, heart rate 47, normal axis, first-degree AV block, QTC 440, nonspecific ST-T wave changes  Telemetry:  Telemetry was personally reviewed and demonstrates: Sinus bradycardia to sinus tachycardia with no significant pauses  Relevant CV Studies: Echocardiogram pending  Laboratory Data:  High Sensitivity Troponin:   Recent Labs  Lab 08/23/19 1655 08/23/19 1915  TROPONINIHS 14 16     Chemistry Recent Labs  Lab 08/23/19 1655 08/23/19 1705 08/24/19 0459  NA 135 138 141  K 4.2 4.1 3.7  CL 100 101 104    CO2 26  --  27  GLUCOSE 96 92 102*  BUN 21 25* 16  CREATININE 1.40* 1.40* 1.20  CALCIUM 8.5*  --  9.2  GFRNONAA 46*  --  56*  GFRAA 54*  --  >60  ANIONGAP 9  --  10    Recent Labs  Lab 08/23/19 1655 08/24/19 0459  PROT 6.5 7.7  ALBUMIN 3.5 3.8  AST 22 21  ALT 11 12  ALKPHOS 44 59  BILITOT 1.0 0.9   Hematology Recent Labs  Lab 08/23/19 1655 08/23/19 1705 08/24/19 0459  WBC 3.8*  --  5.5  RBC 4.14*  --  4.88  HGB 13.3 13.6 15.6  HCT 42.2 40.0 48.5  MCV 101.9*  --  99.4  MCH 32.1  --  32.0  MCHC 31.5  --  32.2  RDW 14.6  --  14.3  PLT 121*  --  119*   BNP Recent Labs  Lab 08/23/19 1655  BNP 41.3    DDimer No results for input(s): DDIMER in the last 168 hours.   Radiology/Studies:  CT Head Wo Contrast  Result Date: 08/23/2019 CLINICAL DATA:  Apnea EXAM: CT HEAD WITHOUT CONTRAST TECHNIQUE: Contiguous axial images were obtained from the base of the skull through the vertex without intravenous contrast. COMPARISON:  None. FINDINGS: Brain: No acute territorial infarction, hemorrhage, or intracranial mass. Extensive hypodensity within the periventricular, subcortical and deep white matter likely chronic small vessel ischemic change. Age indeterminate lacunar infarct left basal ganglia. Mild atrophy. Nonenlarged ventricles Vascular: No hyperdense vessels.  Carotid vascular calcification Skull: Normal. Negative for fracture or focal lesion. Sinuses/Orbits: No acute finding. Other: None IMPRESSION: 1. Negative for intracranial hemorrhage or intracranial mass. 2. Age indeterminate lacunar infarct left basal ganglia 3. Atrophy and extensive hypodensity in the white matter, likely chronic small vessel ischemic change Electronically Signed   By: Donavan Foil M.D.   On: 08/23/2019 19:54   DG Chest Port 1 View  Result Date: 08/23/2019  CLINICAL DATA:  Shortness of breath EXAM: PORTABLE CHEST 1 VIEW COMPARISON:  08/02/2018 FINDINGS: Low lung volumes. Mild cardiomegaly without  consolidation, pleural effusion or pneumothorax. Aortic atherosclerosis. IMPRESSION: No active disease.  Mild cardiomegaly Electronically Signed   By: Donavan Foil M.D.   On: 08/23/2019 17:46         Assessment and Plan:   Bradycardia Patient presented yesterday with an episode of unresponsiveness.  Notes indicate this well for several minutes and EMS was summoned.  His heart rate in the emergency room yesterday was 30s to 40s.  This morning his heart rates in the 60s to 70s.  He has had some heart rates in the upper 30s or low 40s that appear to have occurred during sleep.  No significant pauses noted on telemetry.  At this point, there is no clear indication for pacemaker implantation.  We could potentially proceed with an outpatient 30-day event monitor and follow-up afterward to determine if pacemaker is indicated.    -Echocardiogram pending  -Consider OP 30 day monitor  -MD to see.  History of stroke with right-sided weakness EEG pending.  He is on long-term statin therapy and rivaroxaban.  History of DVT He remains on long-term rivaroxaban.  Hypertension Blood pressure somewhat above target.  Consider increasing amlodipine.  For questions or updates, please contact [place] Please consult [email or web address] for contact info under   Signed, Richardson Dopp, PA-C  08/24/2019 10:47 AM As above, patient seen and examined.  Briefly he is an 85 year old male with past medical history of CVA with residual right-sided weakness, prior DVTs, hypertension, bradycardia for evaluation of unresponsive episode/question syncope.  Patient previously resided in [place] and apparently wore monitors in the past for near syncope all unrevealing.  Those records are not available.  Yesterday after breakfast he was in his wheelchair and moving towards his bedroom.  He then remembers nothing until EMS arrived.  His family stated he was not responding to them.  There was no chest pain, dyspnea, nausea,  palpitations preceding the event.  No incontinence.  He thinks the event lasted approximately 4 minutes.  He was noted to be bradycardic when he was brought to the emergency room and cardiology now asked to evaluate.  Electrocardiogram shows sinus bradycardia with first-degree AV block and nonspecific ST changes.  Telemetry reviewed and shows sinus rhythm to sinus bradycardia with first-degree AV block.  Occasional PVCs.  His heart rate occasionally dips into the 30s but appears to be in the early a.m. hours.  No associated symptoms documented.  Troponins are normal.  1 question syncope-unclear if this was a syncopal episode.  He was unresponsive by report for approximately 4 minutes.  He is intermittently bradycardic but it is unclear whether this is causing his events.  We will await echocardiogram to quantify LV function.  Continue telemetry.  Can potentially discharge tomorrow if LV function normal and no events associated with bradycardia on telemetry.  We will likely arrange an outpatient implantable loop monitor.  If he indeed has events associated with bradycardia and pacemaker would be indicated.  2 hypertension-we will discontinue amlodipine (rare contribution to bradycardia).  Follow blood pressure and adjust regimen as needed.  Could likely add hydralazine if needed.  3 history of DVT-continue Xarelto.  4 prior CVA  Kirk Ruths, MD

## 2019-08-24 NOTE — Progress Notes (Signed)
  Echocardiogram 2D Echocardiogram has been performed.  Steven Kent 08/24/2019, 9:32 AM

## 2019-08-24 NOTE — Procedures (Signed)
Patient Name: Steven Kent  MRN: [id number]  Epilepsy Attending: Charlsie Quest  Referring Physician/Provider: Dr Midge Minium Date: 08/24/2019 Duration: 23.37 mins  Patient history: 85yo M with episode of near syncope/ transient alteration of awareness. EEG to evaluate for seizure.  Level of alertness: awake, asleep  AEDs during EEG study: Gabapentin  Technical aspects: This EEG study was done with scalp electrodes positioned according to the 10-20 International system of electrode placement. Electrical activity was acquired at a sampling rate of 500Hz  and reviewed with a high frequency filter of 70Hz  and a low frequency filter of 1Hz . EEG data were recorded continuously and digitally stored.   DESCRIPTION: The posterior dominant rhythm consists of 9-10 Hz activity of moderate voltage (25-35 uV) seen predominantly in posterior head regions, symmetric and reactive to eye opening and eye closing. Sleep was characterized by vertex waves and sleep spindles (12-14hz )maximal frontocentral. Hyperventilation and photic stimulation were not performed.  IMPRESSION: This study is within normal limits. No seizures or epileptiform discharges were seen throughout the recording.  Ozell Juhasz 

## 2019-08-24 NOTE — Progress Notes (Signed)
EEG complete - results pending 

## 2019-08-25 DIAGNOSIS — R001 Bradycardia, unspecified: Secondary | ICD-10-CM | POA: Diagnosis not present

## 2019-08-25 DIAGNOSIS — I69351 Hemiplegia and hemiparesis following cerebral infarction affecting right dominant side: Secondary | ICD-10-CM | POA: Diagnosis not present

## 2019-08-25 DIAGNOSIS — I714 Abdominal aortic aneurysm, without rupture: Secondary | ICD-10-CM | POA: Diagnosis not present

## 2019-08-25 DIAGNOSIS — R404 Transient alteration of awareness: Secondary | ICD-10-CM | POA: Diagnosis not present

## 2019-08-25 DIAGNOSIS — R4189 Other symptoms and signs involving cognitive functions and awareness: Secondary | ICD-10-CM | POA: Diagnosis not present

## 2019-08-25 LAB — CBC
HCT: 43.6 % (ref 39.0–52.0)
Hemoglobin: 13.9 g/dL (ref 13.0–17.0)
MCH: 31.9 pg (ref 26.0–34.0)
MCHC: 31.9 g/dL (ref 30.0–36.0)
MCV: 100 fL (ref 80.0–100.0)
Platelets: 110 10*3/uL — ABNORMAL LOW (ref 150–400)
RBC: 4.36 MIL/uL (ref 4.22–5.81)
RDW: 14.2 % (ref 11.5–15.5)
WBC: 5.2 10*3/uL (ref 4.0–10.5)
nRBC: 0 % (ref 0.0–0.2)

## 2019-08-25 LAB — BASIC METABOLIC PANEL
Anion gap: 8 (ref 5–15)
BUN: 16 mg/dL (ref 8–23)
CO2: 28 mmol/L (ref 22–32)
Calcium: 8.7 mg/dL — ABNORMAL LOW (ref 8.9–10.3)
Chloride: 106 mmol/L (ref 98–111)
Creatinine, Ser: 1.2 mg/dL (ref 0.61–1.24)
GFR calc Af Amer: >60 mL/min (ref >60)
GFR calc non Af Amer: 56 mL/min — ABNORMAL LOW (ref >60)
Glucose, Bld: 90 mg/dL (ref 70–99)
Potassium: 3.8 mmol/L (ref 3.5–5.1)
Sodium: 142 mmol/L (ref 135–145)

## 2019-08-25 LAB — MAGNESIUM: Magnesium: 2 mg/dL (ref 1.7–2.4)

## 2019-08-25 NOTE — Evaluation (Signed)
Physical Therapy Evaluation Patient Details Name: Steven Kent MRN: [id number] DOB: August 04, 1934 Today's Date: 08/25/2019   History of Present Illness  Sachit Gilman is a 85 y.o. 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  Clinical Impression  Patient presents with decreased mobility due to general weakness, but overall at S level with RW.  Feel he is stable for home with wife supervision and follow up HHPT and [place] aide.  PT to follow if pt not d/c.     Follow Up Recommendations Home health PT;Supervision/Assistance - 24 hour;Other (comment)(HH aide)    Equipment Recommendations  None recommended by PT    Recommendations for Other Services       Precautions / Restrictions Precautions Precautions: Fall      Mobility  Bed Mobility Overal bed mobility: Needs Assistance Bed Mobility: Sit to Supine       Sit to supine: Supervision   General bed mobility comments: S for safety with increased time lifting R leg into bed  Transfers Overall transfer level: Needs assistance Equipment used: Rolling walker (2 wheeled) Transfers: Sit to/from Stand Sit to Stand: From elevated surface;Min guard         General transfer comment: abnormal technique with lifting straight up and assist for balance  Ambulation/Gait Ambulation/Gait assistance: Supervision Gait Distance (Feet): 130 Feet Assistive device: Rolling walker (2 wheeled) Gait Pattern/deviations: Step-to pattern;Step-through pattern;Shuffle;Decreased stride length     General Gait Details: functional gait with RW and S for safety, patient reported significant fatigue after ambulating due to in bed x 3 days previously  Science writer    Modified Rankin (Stroke Patients Only)       Balance Overall balance assessment: Needs assistance   Sitting  balance-Leahy Scale: Good     Standing balance support: Bilateral upper extremity supported Standing balance-Leahy Scale: Poor Standing balance comment: UE support needed for balance                             Pertinent Vitals/Pain Pain Assessment: No/denies pain    Home Living Family/patient expects to be discharged to:: Private residence Living Arrangements: Spouse/significant other Available Help at Discharge: Family Type of Home: House Home Access: Level entry     Home Layout: One level Home Equipment: Environmental consultant - 2 wheels;Cane - single point;Shower seat;Bedside commode;Wheelchair - manual Additional Comments: reportrs the wheelchair is worn out    Prior Function Level of Independence: Needs assistance   Gait / Transfers Assistance Needed: able to ambulate with walker on his own  ADL's / Homemaking Assistance Needed: had [place] aide at one time to help with bathing, but it had stopped and was having trouble        Hand Dominance        Extremity/Trunk Assessment   Upper Extremity Assessment Upper Extremity Assessment: RUE deficits/detail RUE Deficits / Details: AAROM limited shoulder elevation to about 80, moves hand, elbow and wrist with tremor and bradykinesia with increased tone, reports tingling RUE Sensation: decreased light touch;decreased proprioception    Lower Extremity Assessment Lower Extremity Assessment: RLE deficits/detail RLE Deficits / Details: AROM generally WFL, but noted increased tone and slow movements with mild tremor with function RLE Sensation: decreased light touch;decreased proprioception    Cervical / Trunk Assessment Cervical / Trunk  Assessment: Kyphotic  Communication   Communication: No difficulties  Cognition Arousal/Alertness: Awake/alert Behavior During Therapy: WFL for tasks assessed/performed Overall Cognitive Status: Within Functional Limits for tasks assessed                                         General Comments General comments (skin integrity, edema, etc.): Reviewed fall prevention tips for home including lighting, footwear, standing several moments prior to walking, clear pathway in the house for the walker and utilizing help for some ADL's    Exercises     Assessment/Plan    PT Assessment Patient needs continued PT services  PT Problem List Decreased activity tolerance;Decreased mobility;Decreased knowledge of use of DME;Decreased balance;Decreased range of motion;Decreased strength       PT Treatment Interventions DME instruction;Therapeutic activities;Balance training;Therapeutic exercise;Functional mobility training;Gait training;Patient/family education    PT Goals (Current goals can be found in the Care Plan section)  Acute Rehab PT Goals Patient Stated Goal: to go home PT Goal Formulation: With patient Time For Goal Achievement: 09/08/19 Potential to Achieve Goals: Good    Frequency Min 3X/week   Barriers to discharge        Co-evaluation               AM-PAC PT "6 Clicks" Mobility  Outcome Measure Help needed turning from your back to your side while in a flat bed without using bedrails?: None Help needed moving from lying on your back to sitting on the side of a flat bed without using bedrails?: None Help needed moving to and from a bed to a chair (including a wheelchair)?: A Little Help needed standing up from a chair using your arms (e.g., wheelchair or bedside chair)?: None Help needed to walk in hospital room?: None Help needed climbing 3-5 steps with a railing? : A Little 6 Click Score: 22    End of Session Equipment Utilized During Treatment: Gait belt Activity Tolerance: Patient tolerated treatment well Patient left: with call bell/phone within reach;in bed;with bed alarm set   PT Visit Diagnosis: Other abnormalities of gait and mobility (R26.89);Muscle weakness (generalized) (M62.81)    Time: 7564-3329 PT Time Calculation (min)  (ACUTE ONLY): 35 min   Charges:   PT Evaluation $PT Eval Moderate Complexity: 1 Mod PT Treatments $Gait Training: 8-22 mins        Sheran Lawless, [facility] [phone] 08/25/2019   Elray Mcgregor 08/25/2019, 1:12 PM

## 2019-08-25 NOTE — Plan of Care (Signed)

## 2019-08-25 NOTE — Discharge Instructions (Signed)
Bradycardia, Adult Bradycardia is a slower-than-normal heartbeat. A normal resting heart rate for an adult ranges from 60 to 100 beats per minute. With bradycardia, the resting heart rate is less than 60 beats per minute. Bradycardia can prevent enough oxygen from reaching certain areas of your body when you are active. It can be serious if it keeps enough oxygen from reaching your brain and other parts of your body. Bradycardia is not a problem for everyone. For some healthy adults, a slow resting heart rate is normal. What are the causes? This condition may be caused by:  A problem with the heart, including: ? A problem with the heart's electrical system, such as a heart block. With a heart block, electrical signals between the chambers of the heart are partially or completely blocked, so they are not able to work as they should. ? A problem with the heart's natural pacemaker (sinus node). ? Heart disease. ? A heart attack. ? Heart damage. ? Lyme disease. ? A heart infection. ? A heart condition that is present at birth (congenital heart defect).  Certain medicines that treat heart conditions.  Certain conditions, such as hypothyroidism and obstructive sleep apnea.  Problems with the balance of chemicals and other substances, like potassium, in the blood.  Trauma.  Radiation therapy. What increases the risk? You are more likely to develop this condition if you:  Are age 65 or older.  Have high blood pressure (hypertension), high cholesterol (hyperlipidemia), or diabetes.  Drink heavily, use tobacco or nicotine products, or use drugs. What are the signs or symptoms? Symptoms of this condition include:  Light-headedness.  Feeling faint or fainting.  Fatigue and weakness.  Trouble with activity or exercise.  Shortness of breath.  Chest pain (angina).  Drowsiness.  Confusion.  Dizziness. How is this diagnosed? This condition may be diagnosed based on:  Your  symptoms.  Your medical history.  A physical exam. During the exam, your health care provider will listen to your heartbeat and check your pulse. To confirm the diagnosis, your health care provider may order tests, such as:  Blood tests.  An electrocardiogram (ECG). This test records the heart's electrical activity. The test can show how fast your heart is beating and whether the heartbeat is steady.  A test in which you wear a portable device (event recorder or Holter monitor) to record your heart's electrical activity while you go about your day.  Anexercise test. How is this treated? Treatment for this condition depends on the cause of the condition and how severe your symptoms are. Treatment may involve:  Treatment of the underlying condition.  Changing your medicines or how much medicine you take.  Having a small, battery-operated device called a pacemaker implanted under the skin. When bradycardia occurs, this device can be used to increase your heart rate and help your heart beat in a regular rhythm. Follow these instructions at home: Lifestyle   Manage any health conditions that contribute to bradycardia as told by your health care provider.  Follow a heart-healthy diet. A nutrition specialist (dietitian) can help educate you about healthy food options and changes.  Follow an exercise program that is approved by your health care provider.  Maintain a healthy weight.  Try to reduce or manage your stress, such as with yoga or meditation. If you need help reducing stress, ask your health care provider.  Do not use any products that contain nicotine or tobacco, such as cigarettes, e-cigarettes, and chewing tobacco. If you need help   quitting, ask your health care provider.  Do not use illegal drugs.  Limit alcohol intake to no more than 1 drink a day for nonpregnant women and 2 drinks a day for men. Be aware of how much alcohol is in your drink. In the [place] one drink  equals one 12 oz bottle of beer (355 mL), one 5 oz glass of wine (148 mL), or one 1 oz glass of hard liquor (44 mL). General instructions  Take over-the-counter and prescription medicines only as told by your health care provider.  Keep all follow-up visits as told by your health care provider. This is important. How is this prevented? In some cases, bradycardia may be prevented by:  Treating underlying medical problems.  Stopping behaviors or medicines that can trigger the condition. Contact a health care provider if you:  Feel light-headed or dizzy.  Almost faint.  Feel weak or are easily fatigued during physical activity.  Experience confusion or have memory problems. Get help right away if:  You faint.  You have: ? An irregular heartbeat (palpitations). ? Chest pain. ? Trouble breathing. Summary  Bradycardia is a slower-than-normal heartbeat. With bradycardia, the resting heart rate is less than 60 beats per minute.  Treatment for this condition depends on the cause.  Manage any health conditions that contribute to bradycardia as told by your health care provider.  Do not use any products that contain nicotine or tobacco, such as cigarettes, e-cigarettes, and chewing tobacco, and limit alcohol intake.  Keep all follow-up visits as told by your health care provider. This is important. This information is not intended to replace advice given to you by your health care provider. Make sure you discuss any questions you have with your health care provider. Document Revised: 01/29/2018 Document Reviewed: 12/27/2017 Elsevier Patient Education  2020 [employer]  

## 2019-08-25 NOTE — Progress Notes (Signed)
Progress Note  Patient Name: Steven Kent Date of Encounter: 08/25/2019  Primary Cardiologist: Kristeen Miss, MD   Subjective   No CP or dyspnea  Inpatient Medications    Scheduled Meds: . atorvastatin  40 mg Oral QPM  . brimonidine  1 drop Both Eyes Daily  . dorzolamide  1 drop Both Eyes BID  . gabapentin  400 mg Oral BID  . latanoprost  1 drop Both Eyes QHS  . oxybutynin  5 mg Oral BID  . rivaroxaban  20 mg Oral QPM  . torsemide  10 mg Oral Daily  . [START ON 08/28/2019] Vitamin D (Ergocalciferol)  50,000 Units Oral Q7 days   Continuous Infusions:  PRN Meds: acetaminophen **OR** acetaminophen, hydrALAZINE, ondansetron **OR** ondansetron (ZOFRAN) IV   Vital Signs    Vitals:   08/24/19 1043 08/24/19 1517 08/25/19 0115 08/25/19 0437  BP: (!) 151/87   140/71  Pulse:    60  Resp:  16  18  Temp:    98.2 F (36.8 C)  TempSrc:    Axillary  SpO2:    99%  Weight:   84.9 kg 84.9 kg  Height:        Intake/Output Summary (Last 24 hours) at 08/25/2019 0856 Last data filed at 08/25/2019 6967 Gross per 24 hour  Intake 120 ml  Output 850 ml  Net -730 ml   Last 3 Weights 08/25/2019 08/25/2019 08/24/2019  Weight (lbs) 187 lb 1.6 oz 187 lb 1.6 oz 188 lb  Weight (kg) 84.868 kg 84.868 kg 85.276 kg      Telemetry    Sinus bradycardia with pacs and pvcs (HR high 30s at times)- Personally Reviewed  Physical Exam   GEN: No acute distress.   Neck: No JVD Cardiac: RRR Respiratory: Clear to auscultation bilaterally. GI: Soft, nontender, non-distended  MS: No edema Neuro:  Nonfocal  Psych: Normal affect   Labs    High Sensitivity Troponin:   Recent Labs  Lab 08/23/19 1655 08/23/19 1915  TROPONINIHS 14 16      Chemistry Recent Labs  Lab 08/23/19 1655 08/23/19 1655 08/23/19 1705 08/24/19 0459 08/25/19 0431  NA 135   < > 138 141 142  K 4.2   < > 4.1 3.7 3.8  CL 100   < > 101 104 106  CO2 26  --   --  27 28  GLUCOSE 96   < > 92 102* 90  BUN 21   < > 25*  16 16  CREATININE 1.40*   < > 1.40* 1.20 1.20  CALCIUM 8.5*  --   --  9.2 8.7*  PROT 6.5  --   --  7.7  --   ALBUMIN 3.5  --   --  3.8  --   AST 22  --   --  21  --   ALT 11  --   --  12  --   ALKPHOS 44  --   --  59  --   BILITOT 1.0  --   --  0.9  --   GFRNONAA 46*  --   --  56* 56*  GFRAA 54*  --   --  >60 >60  ANIONGAP 9  --   --  10 8   < > = values in this interval not displayed.     Hematology Recent Labs  Lab 08/23/19 1655 08/23/19 1655 08/23/19 1705 08/24/19 0459 08/25/19 0431  WBC 3.8*  --   --  5.5 5.2  RBC 4.14*  --   --  4.88 4.36  HGB 13.3   < > 13.6 15.6 13.9  HCT 42.2   < > 40.0 48.5 43.6  MCV 101.9*  --   --  99.4 100.0  MCH 32.1  --   --  32.0 31.9  MCHC 31.5  --   --  32.2 31.9  RDW 14.6  --   --  14.3 14.2  PLT 121*  --   --  119* 110*   < > = values in this interval not displayed.    BNP Recent Labs  Lab 08/23/19 1655  BNP 41.3      Radiology    CT Head Wo Contrast  Result Date: 08/23/2019 CLINICAL DATA:  Apnea EXAM: CT HEAD WITHOUT CONTRAST TECHNIQUE: Contiguous axial images were obtained from the base of the skull through the vertex without intravenous contrast. COMPARISON:  None. FINDINGS: Brain: No acute territorial infarction, hemorrhage, or intracranial mass. Extensive hypodensity within the periventricular, subcortical and deep white matter likely chronic small vessel ischemic change. Age indeterminate lacunar infarct left basal ganglia. Mild atrophy. Nonenlarged ventricles Vascular: No hyperdense vessels.  Carotid vascular calcification Skull: Normal. Negative for fracture or focal lesion. Sinuses/Orbits: No acute finding. Other: None IMPRESSION: 1. Negative for intracranial hemorrhage or intracranial mass. 2. Age indeterminate lacunar infarct left basal ganglia 3. Atrophy and extensive hypodensity in the white matter, likely chronic small vessel ischemic change Electronically Signed   By: Jasmine PangKim  Fujinaga M.D.   On: 08/23/2019 19:54   DG  Chest Port 1 View  Result Date: 08/23/2019 CLINICAL DATA:  Shortness of breath EXAM: PORTABLE CHEST 1 VIEW COMPARISON:  08/02/2018 FINDINGS: Low lung volumes. Mild cardiomegaly without consolidation, pleural effusion or pneumothorax. Aortic atherosclerosis. IMPRESSION: No active disease.  Mild cardiomegaly Electronically Signed   By: Jasmine PangKim  Fujinaga M.D.   On: 08/23/2019 17:46   EEG adult  Result Date: 08/24/2019 Charlsie QuestYadav, Priyanka O, MD     08/24/2019  6:09 PM Patient Name: Steven QuailBenjamin Kent MRN: [id number] Epilepsy Attending: Charlsie QuestPriyanka O Yadav Referring Physician/Provider: Dr Midge MiniumArshad Kakrakandy Date: 08/24/2019 Duration: 23.37 mins Patient history: 85yo M with episode of near syncope/ transient alteration of awareness. EEG to evaluate for seizure. Level of alertness: awake, asleep AEDs during EEG study: Gabapentin Technical aspects: This EEG study was done with scalp electrodes positioned according to the 10-20 International system of electrode placement. Electrical activity was acquired at a sampling rate of 500Hz  and reviewed with a high frequency filter of 70Hz  and a low frequency filter of 1Hz . EEG data were recorded continuously and digitally stored. DESCRIPTION: The posterior dominant rhythm consists of 9-10 Hz activity of moderate voltage (25-35 uV) seen predominantly in posterior head regions, symmetric and reactive to eye opening and eye closing. Sleep was characterized by vertex waves and sleep spindles (12-14hz )maximal frontocentral. Hyperventilation and photic stimulation were not performed. IMPRESSION: This study is within normal limits. No seizures or epileptiform discharges were seen throughout the recording. Charlsie Questriyanka O Yadav   ECHOCARDIOGRAM COMPLETE  Result Date: 08/24/2019   ECHOCARDIOGRAM REPORT   Patient Name:   Steven QuailBENJAMIN Speir Date of Exam: 08/24/2019 Medical Rec #:  [id number]        Height:       73.5 in Accession #:    [id number]       Weight:       188.0 lb Date of Birth:  1934-10-22        BSA:  2.11 m Patient Age:    84 years         BP:           148/85 mmHg Patient Gender: M                HR:           66 bpm. Exam Location:  Inpatient Procedure: 2D Echo, Cardiac Doppler and Color Doppler Indications:    Syncope  History:        Patient has prior history of Echocardiogram examinations, most                 recent 12/17/2018. Stroke, Arrythmias:Bradycardia; Risk                 Factors:Hypertension.  Sonographer:    Lavenia Atlas Referring Phys: [id number] ARSHAD N KAKRAKANDY IMPRESSIONS  1. Left ventricular ejection fraction, by visual estimation, is 55 to 60%. The left ventricle has normal function. There is moderately increased left ventricular hypertrophy.  2. Basal inferolateral segment is abnormal.  3. Left ventricular diastolic parameters are consistent with Grade I diastolic dysfunction (impaired relaxation).  4. The left ventricle demonstrates regional wall motion abnormalities.  5. Global right ventricle has normal systolic function.The right ventricular size is normal. No increase in right ventricular wall thickness.  6. Left atrial size was normal.  7. Right atrial size was normal.  8. Mild mitral annular calcification.  9. The mitral valve is grossly normal. No evidence of mitral valve regurgitation. 10. The tricuspid valve is grossly normal. 11. The tricuspid valve is grossly normal. Tricuspid valve regurgitation is trivial. 12. The aortic valve is tricuspid. Aortic valve regurgitation is mild. Mild aortic valve sclerosis without stenosis. 13. Pulmonic regurgitation is mild. 14. The pulmonic valve was grossly normal. Pulmonic valve regurgitation is mild. 15. Aortic dilatation noted. 16. There is mild dilatation of the aortic root. 17. Normal pulmonary artery systolic pressure. 18. The interatrial septum was not well visualized. FINDINGS  Left Ventricle: Left ventricular ejection fraction, by visual estimation, is 55 to 60%. The left ventricle has normal function. The left  ventricle demonstrates regional wall motion abnormalities. The left ventricular internal cavity size was the left ventricle is normal in size. There is moderately increased left ventricular hypertrophy. Concentric left ventricular hypertrophy. Left ventricular diastolic parameters are consistent with Grade I diastolic dysfunction (impaired relaxation). Indeterminate  filling pressures.  LV Wall Scoring: The basal inferolateral segment is hypokinetic. Right Ventricle: The right ventricular size is normal. No increase in right ventricular wall thickness. Global RV systolic function is has normal systolic function. The tricuspid regurgitant velocity is 2.34 m/s, and with an assumed right atrial pressure  of 3 mmHg, the estimated right ventricular systolic pressure is normal at 24.9 mmHg. Left Atrium: Left atrial size was normal in size. Right Atrium: Right atrial size was normal in size Pericardium: There is no evidence of pericardial effusion. Mitral Valve: The mitral valve is grossly normal. There is mild thickening of the mitral valve leaflet(s). Mild mitral annular calcification. No evidence of mitral valve regurgitation. Tricuspid Valve: The tricuspid valve is grossly normal. Tricuspid valve regurgitation is trivial. Aortic Valve: The aortic valve is tricuspid. Aortic valve regurgitation is mild. Aortic regurgitation PHT measures 1288 msec. Mild aortic valve sclerosis is present, with no evidence of aortic valve stenosis. Moderate aortic valve annular calcification. Aortic valve mean gradient measures 10.5 mmHg. Aortic valve peak gradient measures 19.2 mmHg. Aortic valve area, by VTI measures 2.69 cm. Pulmonic  Valve: The pulmonic valve was grossly normal. Pulmonic valve regurgitation is mild. Pulmonic regurgitation is mild. Aorta: Aortic dilatation noted. There is mild dilatation of the aortic root. Venous: The inferior vena cava was not well visualized. IAS/Shunts: The interatrial septum was not well visualized.   LEFT VENTRICLE PLAX 2D LVIDd:         4.00 cm  Diastology LVIDs:         3.00 cm  LV e' lateral:   7.94 cm/s LV PW:         1.60 cm  LV E/e' lateral: 7.9 LV IVS:        1.50 cm  LV e' medial:    5.22 cm/s LVOT diam:     2.50 cm  LV E/e' medial:  12.0 LV SV:         35 ml LV SV Index:   16.61 LVOT Area:     4.91 cm  RIGHT VENTRICLE RV Basal diam:  2.60 cm RV S prime:     10.70 cm/s TAPSE (M-mode): 2.3 cm LEFT ATRIUM             Index       RIGHT ATRIUM           Index LA diam:        4.10 cm 1.95 cm/m  RA Area:     16.70 cm LA Vol (A2C):   51.2 ml 24.31 ml/m RA Volume:   41.00 ml  19.46 ml/m LA Vol (A4C):   41.8 ml 19.84 ml/m LA Biplane Vol: 47.9 ml 22.74 ml/m  AORTIC VALVE AV Area (Vmax):    2.33 cm AV Area (Vmean):   2.46 cm AV Area (VTI):     2.69 cm AV Vmax:           219.00 cm/s AV Vmean:          149.500 cm/s AV VTI:            0.469 m AV Peak Grad:      19.2 mmHg AV Mean Grad:      10.5 mmHg LVOT Vmax:         104.00 cm/s LVOT Vmean:        74.800 cm/s LVOT VTI:          0.257 m LVOT/AV VTI ratio: 0.55 AI PHT:            1288 msec  AORTA Ao Root diam: 3.80 cm MITRAL VALVE                        TRICUSPID VALVE MV Area (PHT): 4.21 cm             TR Peak grad:   21.9 mmHg MV PHT:        52.20 msec           TR Vmax:        239.00 cm/s MV Decel Time: 180 msec MV E velocity: 62.40 cm/s 103 cm/s  SHUNTS MV A velocity: 88.10 cm/s 70.3 cm/s Systemic VTI:  0.26 m MV E/A ratio:  0.71       1.5       Systemic Diam: 2.50 cm  Prentice Docker MD Electronically signed by Prentice Docker MD Signature Date/Time: 08/24/2019/12:01:43 PM    Final     Patient Profile     85 year old male with past medical history of CVA with residual right-sided weakness, prior DVTs, hypertension, bradycardia for evaluation  of unresponsive episode/question syncope. Noted to be bradycardic on telemetry.  Echocardiogram shows normal LV function.  Assessment & Plan    1 question syncope-unclear if this was a syncopal episode.   He was unresponsive by report for approximately 4 minutes.  He is intermittently bradycardic on telemetry but it is unclear whether this is causing his events.  Echocardiogram shows normal LV function.   Will arrange implantable loop monitor following discharge.  Note patient has not driven in the past 3 years.   2 hypertension-amlodipine discontinued.  Follow blood pressure and adjust regimen as needed.  3 history of DVT-continue Xarelto.  4 prior CVA  [facility] will sign off.   Medication Recommendations: Continue present medications as listed in MAR. Other recommendations (labs, testing, etc): We will arrange outpatient implantable loop monitor. Follow up as an outpatient: Follow-up electrophysiology for implantable loop monitor 1 to 2 weeks after discharge.  Follow-up Dr. Acie Fredrickson 3 months.  For questions or updates, please contact [place] Please consult [email or web address] for contact info under        Signed, Kirk Ruths, MD  08/25/2019, 8:56 AM

## 2019-08-25 NOTE — TOC Transition Note (Signed)
Transition of Care [facility]) - CM/SW Discharge Note   Patient Details  Name: Steven Kent MRN: [id number] Date of Birth: [birth year]  [facility] of [facility]) CM/SW Contact:  Deveron Furlong, RN 08/25/2019, 10:54 AM   Clinical Narrative:    Patient to d/c home with wife.  Home health ordered.  Wife has no preference of agency. Medicare list reviewed.  Referral accepted by [facility].  RN to check BPs and HHA to help with bathing added to PT.   Final next level of care: Home w Home Health Services Barriers to Discharge: No Barriers Identified   Patient Goals and CMS Choice   CMS Medicare.gov Compare Post Acute Care list provided to:: Patient Represenative (must comment)(wife) Choice offered to / list presented to : Spouse  Discharge Plan and Services      HH Arranged: PT, RN, NA HH Agency: [facility] Date [facility] Agency Contacted: 08/25/19 Time HH Agency Contacted: 1026 Representative spoke with at [facility] Agency: Barbara Cower

## 2019-08-25 NOTE — Discharge Summary (Signed)
Discharge Summary  Steven Kent JOA:[id number] DOB: 1934/09/29  PCP: Ardith Dark, MD  Admit date: 08/23/2019 Discharge date: 08/25/2019  Time spent: .  Recommendations for Outpatient Follow-up:  1. F/u with PCP within a week  for hospital discharge follow up, repeat cbc/bmp at follow up 2. F/u cardiology, cardiology will arrange loop recorder placement.  3. Medication change at discharge: norvasc discontinued per cardiology recommendation due to rare associated with bradycardia.  Patient is advised to check blood pressure at home and  bring in record for his doctor to review and to adjust blood pressure medication  if needed 4. Case manager consulted for home health arrangement  Discharge Diagnoses:  Active Hospital Problems   Diagnosis Date Noted  . Unresponsive episode 08/24/2019  . Bradycardia 08/23/2019  . Hypertensive kidney disease with chronic kidney disease stage III 08/16/2018  . Essential hypertension 08/16/2018  . Coronary artery disease involving native heart without angina pectoris 08/16/2018  . Hemiparesis affecting right side as late effect of cerebrovascular accident (HCC)   . Abdominal aortic aneurysm (AAA) [facility])     Resolved Hospital Problems  No resolved problems to display.    Discharge Condition: stable  Diet recommendation: heart healthy  Filed Weights   08/24/19 0447 08/25/19 0115 08/25/19 0437  Weight: 85.3 kg 84.9 kg 84.9 kg    History of present illness: (per admitting MD Dr Toniann Fail) PCP: Ardith Dark, MD  Patient coming from: Home.  Chief Complaint: Unresponsive episode.  ER physician provided history.  HPI: Steven Kent is a 85 y.o. male with history of hypertension DVT stroke with right-sided weakness who had an unresponsive episode at home for several minutes and EMS was called.  EMS on arrival patient is on heart rate awake but was bradycardic with a heart rate in the 40s.  Mildly hypotensive was given fluid  bolus and was brought to the ER.  ED Course: In the ER patient is alert awake oriented and following all commands.  But heart rate on the monitor was at times dropping to the 30s sinus bradycardia.  ER physician also discussed with on-call neurologist who advised to get a EEG after CT head was normal.  If EEG is abnormal to consult neurology.  Labs show creatinine 1.4 at baseline.  High-sensitivity troponin is negative WBC count was 3.8 and platelets 121.  Covid test was negative.  Hospital Course:  Principal Problem:   Unresponsive episode Active Problems:   Hemiparesis affecting right side as late effect of cerebrovascular accident (HCC)   Abdominal aortic aneurysm (AAA) (HCC)   Essential hypertension   Hypertensive kidney disease with chronic kidney disease stage III   Coronary artery disease involving native heart without angina pectoris   Bradycardia   Recurrent syncope near syncope episode with bradycardia -tele has shown sinus bradycardia with occasional PVCs -Troponin negative, TSH 1.9 -CT head no acute findings -Echo normal LV function -EEG "This study is within normal limits. No seizures or epileptiform discharges were seen throughout the recording" -orthostatic vital signs  blood pressure stable, does has increased heart rate upon standing up -Cardiology consulted ,input appreciated, norvasc discontinued due to rare associated with bradycardia per cardiology recommendation -he is cleared to discharge home with outpatient loop recorder placement to be arranged by cardiology  HTN: stable without norasc, home bp monitoring and outpatient follow up  CKD 2; close to baseline   History of CVA, with residual right hemiparesis, on statin and Xarelto, walks with a walker/wheelchair at baseline, from home with  family  History of DVT, on Xarelto, on Demadex chronically, (presumably for lower extremity edema), this is continued.  Thrombocytopenia, appear chronic, stable at  baseline  History of AAA, denies abdominal pain  History of right eye blindness  DVT Prophylaxis: Xarelto  Code Status: Full  Family Communication: patient   Disposition Plan: home with home health , cleared by cardiology   Consultants:  Cardiology  Procedures:  None  Antibiotics:  None    Discharge Exam: BP 140/71 (BP Location: Left Arm)   Pulse 60   Temp 98.2 F (36.8 C) (Axillary)   Resp 18   Ht 6' 1.5" (1.867 m)   Wt 84.9 kg   SpO2 99%   BMI 24.35 kg/m    General:  NAD  Cardiovascular: RRR  Respiratory: CTABL  Abdomen: Soft/ND/NT, positive BS  Musculoskeletal: Chronic lower extremity edema  Neuro: alert, oriented , right hemiparesis, right eye blind   Discharge Instructions You were cared for by a hospitalist during your hospital stay. If you have any questions about your discharge medications or the care you received while you were in the hospital after you are discharged, you can call the unit and asked to speak with the hospitalist on call if the hospitalist that took care of you is not available. Once you are discharged, your primary care physician will handle any further medical issues. Please note that NO REFILLS for any discharge medications will be authorized once you are discharged, as it is imperative that you return to your primary care physician (or establish a relationship with a primary care physician if you do not have one) for your aftercare needs so that they can reassess your need for medications and monitor your lab values.  Discharge Instructions    Diet - low sodium heart healthy   Complete by: As directed    Increase activity slowly   Complete by: As directed      Allergies as of 08/25/2019      Reactions   Aspirin Other (See Comments)   Full Strength Asprin-325 mg causes upset stomach. Can take 81 mg.      Medication List    STOP taking these medications   amLODipine 2.5 MG tablet Commonly known as:  NORVASC     TAKE these medications   atorvastatin 40 MG tablet Commonly known as: LIPITOR Take 1 tablet (40 mg total) by mouth every evening.   brimonidine 0.15 % ophthalmic solution Commonly known as: ALPHAGAN Place 1 drop into both eyes daily.   dorzolamide 2 % ophthalmic solution Commonly known as: TRUSOPT Place 1 drop into both eyes 2 (two) times daily.   gabapentin 400 MG capsule Commonly known as: NEURONTIN Take 1 capsule (400 mg total) by mouth 2 (two) times daily.   latanoprost 0.005 % ophthalmic solution Commonly known as: XALATAN Place 1 drop into both eyes at bedtime.   oxybutynin 5 MG tablet Commonly known as: DITROPAN Take 1 tablet (5 mg total) by mouth 2 (two) times daily.   rivaroxaban 20 MG Tabs tablet Commonly known as: XARELTO Take 1 tablet (20 mg total) by mouth every evening.   torsemide 10 MG tablet Commonly known as: DEMADEX Take 1 tablet (10 mg total) by mouth daily.   Vitamin D (Ergocalciferol) 1.25 MG (50000 UNIT) Caps capsule Commonly known as: DRISDOL Take 1 capsule (50,000 Units total) by mouth every 7 (seven) days.      Allergies  Allergen Reactions  . Aspirin Other (See Comments)    Full  Strength Asprin-325 mg causes upset stomach. Can take 81 mg.   Follow-up Information    Ardith DarkParker, Caleb M, MD Follow up in 1 week(s).   Specialty: Family Medicine Why: hospital discharge follow up Contact information: [address] [place] [address] [phone]        Nahser, Deloris PingPhilip J, MD Follow up.   Specialty: Cardiology Why: your blood pressure medication norvasc is discontinued due to possible association with slow heart rate, please monitor your blood pressure at home, bring in blood pressure record for your doctor to reivew for further bp meds adjustment if needed Contact information: [address] [place] [address] [phone]        cardiology office will arrange heart monitor (loop recorder) Follow up.             The results of significant diagnostics from this hospitalization (including imaging, microbiology, ancillary and laboratory) are listed below for reference.    Significant Diagnostic Studies: CT Head Wo Contrast  Result Date: 08/23/2019 CLINICAL DATA:  Apnea EXAM: CT HEAD WITHOUT CONTRAST TECHNIQUE: Contiguous axial images were obtained from the base of the skull through the vertex without intravenous contrast. COMPARISON:  None. FINDINGS: Brain: No acute territorial infarction, hemorrhage, or intracranial mass. Extensive hypodensity within the periventricular, subcortical and deep white matter likely chronic small vessel ischemic change. Age indeterminate lacunar infarct left basal ganglia. Mild atrophy. Nonenlarged ventricles Vascular: No hyperdense vessels.  Carotid vascular calcification Skull: Normal. Negative for fracture or focal lesion. Sinuses/Orbits: No acute finding. Other: None IMPRESSION: 1. Negative for intracranial hemorrhage or intracranial mass. 2. Age indeterminate lacunar infarct left basal ganglia 3. Atrophy and extensive hypodensity in the white matter, likely chronic small vessel ischemic change Electronically Signed   By: Jasmine PangKim  Fujinaga M.D.   On: 08/23/2019 19:54   DG Chest Port 1 View  Result Date: 08/23/2019 CLINICAL DATA:  Shortness of breath EXAM: PORTABLE CHEST 1 VIEW COMPARISON:  08/02/2018 FINDINGS: Low lung volumes. Mild cardiomegaly without consolidation, pleural effusion or pneumothorax. Aortic atherosclerosis. IMPRESSION: No active disease.  Mild cardiomegaly Electronically Signed   By: Jasmine PangKim  Fujinaga M.D.   On: 08/23/2019 17:46   EEG adult  Result Date: 08/24/2019 Charlsie QuestYadav, Priyanka O, MD     08/24/2019  6:09 PM Patient Name: Steven QuailBenjamin Allnutt MRN: [id number] Epilepsy Attending: Charlsie QuestPriyanka O Yadav Referring Physician/Provider: Dr Midge MiniumArshad Kakrakandy Date: 08/24/2019 Duration: 23.37 mins Patient history: 85yo M with episode of near syncope/ transient alteration of  awareness. EEG to evaluate for seizure. Level of alertness: awake, asleep AEDs during EEG study: Gabapentin Technical aspects: This EEG study was done with scalp electrodes positioned according to the 10-20 International system of electrode placement. Electrical activity was acquired at a sampling rate of 500Hz  and reviewed with a high frequency filter of 70Hz  and a low frequency filter of 1Hz . EEG data were recorded continuously and digitally stored. DESCRIPTION: The posterior dominant rhythm consists of 9-10 Hz activity of moderate voltage (25-35 uV) seen predominantly in posterior head regions, symmetric and reactive to eye opening and eye closing. Sleep was characterized by vertex waves and sleep spindles (12-14hz )maximal frontocentral. Hyperventilation and photic stimulation were not performed. IMPRESSION: This study is within normal limits. No seizures or epileptiform discharges were seen throughout the recording. Charlsie Questriyanka O Yadav   ECHOCARDIOGRAM COMPLETE  Result Date: 08/24/2019   ECHOCARDIOGRAM REPORT   Patient Name:   Steven QuailBENJAMIN Kent Date of Exam: 08/24/2019 Medical Rec #:  [id number]        Height:  73.5 in Accession #:    [id number]       Weight:       188.0 lb Date of Birth:  1934-09-01       BSA:          2.11 m Patient Age:    84 years         BP:           148/85 mmHg Patient Gender: M                HR:           66 bpm. Exam Location:  Inpatient Procedure: 2D Echo, Cardiac Doppler and Color Doppler Indications:    Syncope  History:        Patient has prior history of Echocardiogram examinations, most                 recent 12/17/2018. Stroke, Arrythmias:Bradycardia; Risk                 Factors:Hypertension.  Sonographer:    Lavenia Atlas Referring Phys: [id number] ARSHAD N KAKRAKANDY IMPRESSIONS  1. Left ventricular ejection fraction, by visual estimation, is 55 to 60%. The left ventricle has normal function. There is moderately increased left ventricular hypertrophy.  2. Basal inferolateral  segment is abnormal.  3. Left ventricular diastolic parameters are consistent with Grade I diastolic dysfunction (impaired relaxation).  4. The left ventricle demonstrates regional wall motion abnormalities.  5. Global right ventricle has normal systolic function.The right ventricular size is normal. No increase in right ventricular wall thickness.  6. Left atrial size was normal.  7. Right atrial size was normal.  8. Mild mitral annular calcification.  9. The mitral valve is grossly normal. No evidence of mitral valve regurgitation. 10. The tricuspid valve is grossly normal. 11. The tricuspid valve is grossly normal. Tricuspid valve regurgitation is trivial. 12. The aortic valve is tricuspid. Aortic valve regurgitation is mild. Mild aortic valve sclerosis without stenosis. 13. Pulmonic regurgitation is mild. 14. The pulmonic valve was grossly normal. Pulmonic valve regurgitation is mild. 15. Aortic dilatation noted. 16. There is mild dilatation of the aortic root. 17. Normal pulmonary artery systolic pressure. 18. The interatrial septum was not well visualized. FINDINGS  Left Ventricle: Left ventricular ejection fraction, by visual estimation, is 55 to 60%. The left ventricle has normal function. The left ventricle demonstrates regional wall motion abnormalities. The left ventricular internal cavity size was the left ventricle is normal in size. There is moderately increased left ventricular hypertrophy. Concentric left ventricular hypertrophy. Left ventricular diastolic parameters are consistent with Grade I diastolic dysfunction (impaired relaxation). Indeterminate  filling pressures.  LV Wall Scoring: The basal inferolateral segment is hypokinetic. Right Ventricle: The right ventricular size is normal. No increase in right ventricular wall thickness. Global RV systolic function is has normal systolic function. The tricuspid regurgitant velocity is 2.34 m/s, and with an assumed right atrial pressure  of 3 mmHg,  the estimated right ventricular systolic pressure is normal at 24.9 mmHg. Left Atrium: Left atrial size was normal in size. Right Atrium: Right atrial size was normal in size Pericardium: There is no evidence of pericardial effusion. Mitral Valve: The mitral valve is grossly normal. There is mild thickening of the mitral valve leaflet(s). Mild mitral annular calcification. No evidence of mitral valve regurgitation. Tricuspid Valve: The tricuspid valve is grossly normal. Tricuspid valve regurgitation is trivial. Aortic Valve: The aortic valve is tricuspid. Aortic valve regurgitation is mild. Aortic regurgitation  PHT measures 1288 msec. Mild aortic valve sclerosis is present, with no evidence of aortic valve stenosis. Moderate aortic valve annular calcification. Aortic valve mean gradient measures 10.5 mmHg. Aortic valve peak gradient measures 19.2 mmHg. Aortic valve area, by VTI measures 2.69 cm. Pulmonic Valve: The pulmonic valve was grossly normal. Pulmonic valve regurgitation is mild. Pulmonic regurgitation is mild. Aorta: Aortic dilatation noted. There is mild dilatation of the aortic root. Venous: The inferior vena cava was not well visualized. IAS/Shunts: The interatrial septum was not well visualized.  LEFT VENTRICLE PLAX 2D LVIDd:         4.00 cm  Diastology LVIDs:         3.00 cm  LV e' lateral:   7.94 cm/s LV PW:         1.60 cm  LV E/e' lateral: 7.9 LV IVS:        1.50 cm  LV e' medial:    5.22 cm/s LVOT diam:     2.50 cm  LV E/e' medial:  12.0 LV SV:         35 ml LV SV Index:   16.61 LVOT Area:     4.91 cm  RIGHT VENTRICLE RV Basal diam:  2.60 cm RV S prime:     10.70 cm/s TAPSE (M-mode): 2.3 cm LEFT ATRIUM             Index       RIGHT ATRIUM           Index LA diam:        4.10 cm 1.95 cm/m  RA Area:     16.70 cm LA Vol (A2C):   51.2 ml 24.31 ml/m RA Volume:   41.00 ml  19.46 ml/m LA Vol (A4C):   41.8 ml 19.84 ml/m LA Biplane Vol: 47.9 ml 22.74 ml/m  AORTIC VALVE AV Area (Vmax):    2.33 cm AV  Area (Vmean):   2.46 cm AV Area (VTI):     2.69 cm AV Vmax:           219.00 cm/s AV Vmean:          149.500 cm/s AV VTI:            0.469 m AV Peak Grad:      19.2 mmHg AV Mean Grad:      10.5 mmHg LVOT Vmax:         104.00 cm/s LVOT Vmean:        74.800 cm/s LVOT VTI:          0.257 m LVOT/AV VTI ratio: 0.55 AI PHT:            1288 msec  AORTA Ao Root diam: 3.80 cm MITRAL VALVE                        TRICUSPID VALVE MV Area (PHT): 4.21 cm             TR Peak grad:   21.9 mmHg MV PHT:        52.20 msec           TR Vmax:        239.00 cm/s MV Decel Time: 180 msec MV E velocity: 62.40 cm/s 103 cm/s  SHUNTS MV A velocity: 88.10 cm/s 70.3 cm/s Systemic VTI:  0.26 m MV E/A ratio:  0.71       1.5       Systemic Diam: 2.50 cm  Kate Sable MD Electronically signed by Kate Sable MD Signature Date/Time: 08/24/2019/12:01:43 PM    Final     Microbiology: Recent Results (from the past 240 hour(s))  SARS CORONAVIRUS 2 (TAT 6-24 HRS) Nasopharyngeal Nasopharyngeal Swab     Status: None   Collection Time: 08/23/19  6:53 PM   Specimen: Nasopharyngeal Swab  Result Value Ref Range Status   SARS Coronavirus 2 NEGATIVE NEGATIVE Final    Comment: (NOTE) SARS-CoV-2 target nucleic acids are NOT DETECTED. The SARS-CoV-2 RNA is generally detectable in upper and lower respiratory specimens during the acute phase of infection. Negative results do not preclude SARS-CoV-2 infection, do not rule out co-infections with other pathogens, and should not be used as the sole basis for treatment or other patient management decisions. Negative results must be combined with clinical observations, patient history, and epidemiological information. The expected result is Negative. Fact Sheet for Patients: SugarRoll.be Fact Sheet for Healthcare Providers: https://www.woods-mathews.com/ This test is not yet approved or cleared by the [place] FDA and  has been authorized for  detection and/or diagnosis of SARS-CoV-2 by FDA under an Emergency Use Authorization (EUA). This EUA will remain  in effect (meaning this test can be used) for the duration of the COVID-19 declaration under Section 56 4(b)(1) of the Act, 21 U.S.C. section 360bbb-3(b)(1), unless the authorization is terminated or revoked sooner. Performed at [facility], [place] [address] [place], [place] [address]      Labs: Basic Metabolic Panel: Recent Labs  Lab 08/23/19 1655 08/23/19 1705 08/24/19 0459 08/25/19 0431  NA 135 138 141 142  K 4.2 4.1 3.7 3.8  CL 100 101 104 106  CO2 26  --  27 28  GLUCOSE 96 92 102* 90  BUN 21 25* 16 16  CREATININE 1.40* 1.40* 1.20 1.20  CALCIUM 8.5*  --  9.2 8.7*  MG  --   --  2.2 2.0   Liver Function Tests: Recent Labs  Lab 08/23/19 1655 08/24/19 0459  AST 22 21  ALT 11 12  ALKPHOS 44 59  BILITOT 1.0 0.9  PROT 6.5 7.7  ALBUMIN 3.5 3.8   No results for input(s): LIPASE, AMYLASE in the last 168 hours. No results for input(s): AMMONIA in the last 168 hours. CBC: Recent Labs  Lab 08/23/19 1655 08/23/19 1705 08/24/19 0459 08/25/19 0431  WBC 3.8*  --  5.5 5.2  NEUTROABS 2.1  --  2.7  --   HGB 13.3 13.6 15.6 13.9  HCT 42.2 40.0 48.5 43.6  MCV 101.9*  --  99.4 100.0  PLT 121*  --  119* 110*   Cardiac Enzymes: No results for input(s): CKTOTAL, CKMB, CKMBINDEX, TROPONINI in the last 168 hours. BNP: BNP (last 3 results) Recent Labs    08/23/19 1655  BNP 41.3    ProBNP (last 3 results) No results for input(s): PROBNP in the last 8760 hours.  CBG: No results for input(s): GLUCAP in the last 168 hours.     Signed:  Florencia Reasons MD, PhD, FACP  [facility] 08/25/2019, 9:21 AM

## 2019-08-29 ENCOUNTER — Telehealth: Payer: Self-pay | Admitting: Family Medicine

## 2019-08-29 NOTE — Telephone Encounter (Signed)
Marcelino Duster from [facility] called to request verbal orders for skilled nursing 1 x a week for 4 weeks. Please return call at [phone].

## 2019-08-29 NOTE — Telephone Encounter (Signed)
Nurse called from [facility] *

## 2019-08-30 NOTE — Telephone Encounter (Signed)
Spoke with Elon Jester gave a ok for verbal

## 2019-09-03 ENCOUNTER — Encounter: Payer: Self-pay | Admitting: Podiatry

## 2019-09-03 ENCOUNTER — Ambulatory Visit (INDEPENDENT_AMBULATORY_CARE_PROVIDER_SITE_OTHER): Payer: Medicare Other | Admitting: Podiatry

## 2019-09-03 ENCOUNTER — Other Ambulatory Visit: Payer: Self-pay

## 2019-09-03 DIAGNOSIS — B351 Tinea unguium: Secondary | ICD-10-CM

## 2019-09-03 DIAGNOSIS — M79674 Pain in right toe(s): Secondary | ICD-10-CM

## 2019-09-03 DIAGNOSIS — R609 Edema, unspecified: Secondary | ICD-10-CM

## 2019-09-03 DIAGNOSIS — M79675 Pain in left toe(s): Secondary | ICD-10-CM

## 2019-09-03 DIAGNOSIS — G629 Polyneuropathy, unspecified: Secondary | ICD-10-CM

## 2019-09-03 NOTE — Progress Notes (Signed)
Complaint:  Visit Type: Patient returns to my office for continued preventative foot care services. Complaint: Patient states" my nails have grown long and thick and become painful to walk and wear shoes" Patient has been diagnosed with neuropathy.  Significant swelling right foot/leg. The patient presents for preventative foot care services. No changes to ROS. Patient is brought to the office by male caregiver.  Podiatric Exam: Vascular: dorsalis pedis and posterior tibial pulses are  Minimally palpable left foot.  No pulses right foot due to swelling. palpable . Capillary return is immediate. Temperature gradient is WNL. Skin turgor WNL  Sensorium: Normal Semmes Weinstein monofilament test. Normal tactile sensation bilaterally. Nail Exam: Pt has thick disfigured discolored nails with subungual debris noted bilateral entire nail hallux through fifth toenails Ulcer Exam: There is no evidence of ulcer or pre-ulcerative changes or infection. Orthopedic Exam: Muscle tone and strength are WNL. No limitations in general ROM. No crepitus or effusions noted. Foot type and digits show no abnormalities. Bony prominences are unremarkable. Swelling right leg/foot secondary to CVA. Skin: No Porokeratosis. No infection or ulcers  Diagnosis:  Onychomycosis, , Pain in right toe, pain in left toes  Treatment & Plan Procedures and Treatment: Consent by patient was obtained for treatment procedures.   Debridement of mycotic and hypertrophic toenails, 1 through 5 bilateral and clearing of subungual debris. No ulceration, no infection noted.  Return Visit-Office Procedure: Patient instructed to return to the office for a follow up visit 3 months for continued evaluation and treatment.    Helane Gunther DPM

## 2019-09-08 ENCOUNTER — Other Ambulatory Visit: Payer: Self-pay | Admitting: Family Medicine

## 2019-09-09 ENCOUNTER — Ambulatory Visit (INDEPENDENT_AMBULATORY_CARE_PROVIDER_SITE_OTHER): Payer: Medicare Other | Admitting: Family Medicine

## 2019-09-09 ENCOUNTER — Other Ambulatory Visit: Payer: Self-pay

## 2019-09-09 VITALS — BP 152/80 | HR 65 | Temp 98.2°F | Ht 73.5 in | Wt 195.2 lb

## 2019-09-09 DIAGNOSIS — R600 Localized edema: Secondary | ICD-10-CM

## 2019-09-09 DIAGNOSIS — R4189 Other symptoms and signs involving cognitive functions and awareness: Secondary | ICD-10-CM | POA: Diagnosis not present

## 2019-09-09 DIAGNOSIS — R001 Bradycardia, unspecified: Secondary | ICD-10-CM

## 2019-09-09 DIAGNOSIS — N3281 Overactive bladder: Secondary | ICD-10-CM

## 2019-09-09 NOTE — Assessment & Plan Note (Signed)
No further episodes.  Possibly due to bradycardia.  Will follow up with cardiology soon.

## 2019-09-09 NOTE — Progress Notes (Signed)
   Steven Kent is a 85 y.o. male who presents today for an office visit.  Assessment/Plan:  Chronic Problems Addressed Today: Unresponsive episode No further episodes.  Possibly due to bradycardia.  Will follow up with cardiology soon.  Bradycardia Unclear summation.  Heart rate is 65 today however per wife they have been into the 40s and 50s at home over the last week even without any antihypertensive.  While it is unlikely, it is possible that oxybutynin could be contributing some to possible underlying arrythmia.  We will stop for a couple of weeks to see if this helps.  He will follow-up with cardiology as previously planned.  Leg edema Continue torsemide 10 mg daily.  Discussed importance of salt avoidance and frequent leg elevation.  Overactive bladder We will try off oxybutynin for a couple of weeks as noted above.  Patient is not sure if this medication is helpful.     Subjective:  HPI:  Patient presented to the ED on 08/23/2019 following an unresponsive episode at home.  EMS was called and he was brought to the emergency department.  He was found to have heart rate into the 30s and 40s.  Cardiology was consulted and patient was admitted.  Work-up during hospitalization included negative troponin, normal TSH, negative head CT, normal echocardiogram, and normal EEG.  Cardiology stopped his Norvasc due to flare associated bradycardia.  He was planned to have loop recorder placed at follow-up.  He has done well since being discharged from the hospital.  Has not had any further episodes.  Patient's wife is concerned about his sodium intake.  States that he eats multiple pieces of sausage every day.  Is having some lower extremity swelling.  No reported chest pain or shortness of breath.       Objective:  Physical Exam: BP (!) 152/80   Pulse 65   Temp 98.2 F (36.8 C)   Ht 6' 1.5" (1.867 m)   Wt 195 lb 3.2 oz (88.5 kg)   SpO2 98%   BMI 25.40 kg/m   Gen: No acute  distress, resting comfortably CV: Regular rate and rhythm with no murmurs appreciated Pulm: Normal work of breathing, clear to auscultation bilaterally with no crackles, wheezes, or rhonchi Neuro: Grossly normal, moves all extremities Psych: Normal affect and thought content  Time Spent: 47 minutes of total time was spent on the date of the encounter performing the following actions: chart review prior to seeing the patient including recent hospitalization and work-up for melena, obtaining history, performing a medically necessary exam, counseling on the treatment plan, placing orders, and documenting in our EHR.        Katina Degree. [name], MD 09/09/2019 2:11 PM

## 2019-09-09 NOTE — Assessment & Plan Note (Signed)
We will try off oxybutynin for a couple of weeks as noted above.  Patient is not sure if this medication is helpful.

## 2019-09-09 NOTE — Assessment & Plan Note (Addendum)
Unclear summation.  Heart rate is 65 today however per wife they have been into the 40s and 50s at home over the last week even without any antihypertensive.  While it is unlikely, it is possible that oxybutynin could be contributing some to possible underlying arrythmia.  We will stop for a couple of weeks to see if this helps.  He will follow-up with cardiology as previously planned.

## 2019-09-09 NOTE — Patient Instructions (Signed)
It was very nice to see you today!  Please try to watch your sodium intake.  We will stop oxybutynin for a few weeks.  Please call your cardiologist soon.  Come back to see me in 3 to 6 months, or sooner if needed.  Take care, Dr [name]  Please try these tips to maintain a healthy lifestyle:   Eat at least 3 REAL meals and 1-2 snacks per day.  Aim for no more than 5 hours between eating.  If you eat breakfast, please do so within one hour of getting up.    Each meal should contain half fruits/vegetables, one quarter protein, and one quarter carbs (no bigger than a computer mouse)   Cut down on sweet beverages. This includes juice, soda, and sweet tea.     Drink at least 1 glass of water with each meal and aim for at least 8 glasses per day   Exercise at least 150 minutes every week.

## 2019-09-09 NOTE — Assessment & Plan Note (Signed)
Continue torsemide 10 mg daily.  Discussed importance of salt avoidance and frequent leg elevation.

## 2019-09-21 DIAGNOSIS — Z86718 Personal history of other venous thrombosis and embolism: Secondary | ICD-10-CM

## 2019-09-21 DIAGNOSIS — I129 Hypertensive chronic kidney disease with stage 1 through stage 4 chronic kidney disease, or unspecified chronic kidney disease: Secondary | ICD-10-CM | POA: Diagnosis not present

## 2019-09-21 DIAGNOSIS — Z7901 Long term (current) use of anticoagulants: Secondary | ICD-10-CM

## 2019-09-21 DIAGNOSIS — I714 Abdominal aortic aneurysm, without rupture: Secondary | ICD-10-CM

## 2019-09-21 DIAGNOSIS — Z87891 Personal history of nicotine dependence: Secondary | ICD-10-CM

## 2019-09-21 DIAGNOSIS — Z9181 History of falling: Secondary | ICD-10-CM

## 2019-09-21 DIAGNOSIS — R001 Bradycardia, unspecified: Secondary | ICD-10-CM

## 2019-09-21 DIAGNOSIS — I2510 Atherosclerotic heart disease of native coronary artery without angina pectoris: Secondary | ICD-10-CM | POA: Diagnosis not present

## 2019-09-21 DIAGNOSIS — I69351 Hemiplegia and hemiparesis following cerebral infarction affecting right dominant side: Secondary | ICD-10-CM

## 2019-09-21 DIAGNOSIS — N183 Chronic kidney disease, stage 3 unspecified: Secondary | ICD-10-CM

## 2019-10-09 ENCOUNTER — Encounter: Payer: Self-pay | Admitting: Cardiovascular Disease

## 2019-10-09 NOTE — Progress Notes (Signed)
Cardiology Office Note:    Date:  10/10/2019   ID:  Steven Kent, DOB 03-01-1935, MRN [id number]  PCP:  Steven Dark, MD  Cardiologist:  Steven Miss, MD  Electrophysiologist:  None   Referring MD: Steven Dark, MD   Chief Complaint  Patient presents with  . Hypertension    October 08, 2018    Steven Kent is a 84 y.o. male with a hx of CVA in 2014.   Seen with wife Steven Kent.   We were asked to see  Him for his heart murmur and an irreg. HR   Was found to have    , AAA , HTN , VTE ( on xarelto)  There are reports that he has CAD but I cannot find any details of this  He moved from [place] so records are not here Had multiple episodes of syncope - seemingly related to dehydration   Is wheelchair bound since the CVA .   Can use a walker to get to the car.  Has had multiple DVTs.   - is now on Xarelto.   Warfarin did not keep the DVT controlled.   October 10, 2019: Steven Kent is seen today for follow-up of his hypertension, DVT and episode of syncope which was likely due to orthostasis.  He is on Xarelto for history of recurrent DVT. Examined in wheelchair,   Has been wheelchair bound since DVA  No , no dyspnea  Wife says he had an episode of unresponsiveness for multiple minutes,  spent 2 days in the hospital  He was seen by Dr. Jens Kent and Steven Newcomer, PA in the hospital.  He was found to be bradycardic.  The plan was to implant a loop recorder. Has continued to feel poorly at times.   Unable to tell his wife why he does not feel well. He is not on any heart rate slowing medications.  Past Medical History:  Diagnosis Date  . Abdominal aortic aneurysm (AAA) (HCC)   . Hypertension   . Stroke [facility])     History reviewed. No pertinent surgical history.  Current Medications: Current Meds  Medication Sig  . atorvastatin (LIPITOR) 40 MG tablet TAKE 1 TABLET(40 MG) BY MOUTH EVERY EVENING  . brimonidine (ALPHAGAN) 0.15 % ophthalmic solution Place 1 drop into  both eyes daily.  . dorzolamide (TRUSOPT) 2 % ophthalmic solution Place 1 drop into both eyes 2 (two) times daily.  Marland Kitchen gabapentin (NEURONTIN) 400 MG capsule TAKE 1 CAPSULE(400 MG) BY MOUTH TWICE DAILY  . latanoprost (XALATAN) 0.005 % ophthalmic solution Place 1 drop into both eyes at bedtime.  Marland Kitchen oxybutynin (DITROPAN) 5 MG tablet Take 1 tablet (5 mg total) by mouth 2 (two) times daily.  Marland Kitchen torsemide (DEMADEX) 10 MG tablet Take 1 tablet (10 mg total) by mouth daily.  . Vitamin D, Ergocalciferol, (DRISDOL) 1.25 MG (50000 UT) CAPS capsule Take 1 capsule (50,000 Units total) by mouth every 7 (seven) days.  Carlena Hurl 20 MG TABS tablet TAKE 1 TABLET(20 MG) BY MOUTH EVERY EVENING     Allergies:   Aspirin   Social History   Socioeconomic History  . Marital status: Married    Spouse name: Not on file  . Number of children: Not on file  . Years of education: Not on file  . Highest education level: Not on file  Occupational History  . Not on file  Tobacco Use  . Smoking status: Former Games developer  . Smokeless tobacco: Never Used  Substance  and Sexual Activity  . Alcohol use: Never  . Drug use: Never  . Sexual activity: Not on file  Other Topics Concern  . Not on file  Social History Narrative  . Not on file   Social Determinants of Health   Financial Resource Strain:   . Difficulty of Paying Living Expenses:   Food Insecurity:   . Worried About Programme researcher, broadcasting/film/video in the Last Year:   . Barista in the Last Year:   Transportation Needs:   . Freight forwarder (Medical):   Marland Kitchen Lack of Transportation (Non-Medical):   Physical Activity:   . Days of Exercise per Week:   . Minutes of Exercise per Session:   Stress:   . Feeling of Stress :   Social Connections:   . Frequency of Communication with Friends and Family:   . Frequency of Social Gatherings with Friends and Family:   . Attends Religious Services:   . Active Member of Clubs or Organizations:   . Attends Tax inspector Meetings:   Marland Kitchen Marital Status:      Family History: The patient's family history is negative for Prostate cancer.  ROS:   Please see the history of present illness.     All other systems reviewed and are negative.  EKGs/Labs/Other Studies Reviewed:    The following studies were reviewed today:   EKG:       Recent Labs: 08/23/2019: B Natriuretic Peptide 41.3 08/24/2019: ALT 12; TSH 1.921 08/25/2019: BUN 16; Creatinine, Ser 1.20; Hemoglobin 13.9; Magnesium 2.0; Platelets 110; Potassium 3.8; Sodium 142  Recent Lipid Panel    Component Value Date/Time   CHOL 133 02/12/2019 1227   TRIG 52.0 02/12/2019 1227   HDL 47.70 02/12/2019 1227   CHOLHDL 3 02/12/2019 1227   VLDL 10.4 02/12/2019 1227   LDLCALC 74 02/12/2019 1227    Physical Exam:    Physical Exam: Blood pressure 124/60, pulse (!) 58, height 6' 1.5" (1.867 m), weight 188 lb 2 oz (85.3 kg), SpO2 99 %.  GEN: Elderly, chronically ill-appearing gentleman, no acute distress examined in the wheelchair. HEENT: Normal NECK: No JVD; No carotid bruits LYMPHATICS: No lymphadenopathy CARDIAC: RRR , bradycardic. RESPIRATORY:  Clear to auscultation without rales, wheezing or rhonchi  ABDOMEN: Soft, non-tender, non-distended MUSCULOSKELETAL:  No edema; No deformity  SKIN: Warm and dry NEUROLOGIC: Status post stroke.    ASSESSMENT:    1. VTE (venous thromboembolism)   2. Essential hypertension   3. Bradycardia   4. Syncope and collapse    PLAN:       1.  Recurrent syncope: The patient was hospitalized in January for an episode of unresponsiveness.  His heart rate was in the 30s to 40s when EMS arrived and he has remained bradycardic.  He was seen by Dr. Jens Kent during that time and the recommendation was for an outpatient placement of an implantable loop recorder.  He is worn a monitor in the past and this did not show any significant bradycardia to explain his episodes of unresponsiveness. I agree that an  implantable loop is our next best step.  We will have him continue to follow with electrophysiology. He does not have any other cardiac issues that are not already followed by Dr. Jimmey Kent.  He will continue to follow-up with Dr. Jimmey Kent as well.  2.  Recurrent DVT: Continue Xarelto.  He will continue to be followed by Dr. Jimmey Kent  3.  History bradycardia: He continues to have  sinus bradycardia.  He will be seen by electrophysiology and will the plan is to have an implantable loop placed.  4.  Hypertension: Further management per Dr. Jerline Pain.  Medication Adjustments/Labs and Tests Ordered: Current medicines are reviewed at length with the patient today.  Concerns regarding medicines are outlined above.  Orders Placed This Encounter  Procedures  . Ambulatory referral to Cardiac Electrophysiology   No orders of the defined types were placed in this encounter.    Patient Instructions  Medication Instructions:  The current medical regimen is effective;  continue present plan and medications.  *If you need a refill on your cardiac medications before your next appointment, please call your pharmacy*  You have been referred to one of our Electrophysiologist for further evaluation of syncope.  Follow-Up: Follow up with Dr Acie Fredrickson as needed.  Thank you for choosing [facility]!!        Signed, Mertie Moores, MD  10/10/2019 1:17 PM    [place]

## 2019-10-10 ENCOUNTER — Other Ambulatory Visit: Payer: Self-pay

## 2019-10-10 ENCOUNTER — Encounter: Payer: Self-pay | Admitting: Cardiovascular Disease

## 2019-10-10 ENCOUNTER — Ambulatory Visit (INDEPENDENT_AMBULATORY_CARE_PROVIDER_SITE_OTHER): Payer: Medicare Other | Admitting: Cardiovascular Disease

## 2019-10-10 VITALS — BP 124/60 | HR 58 | Ht 73.5 in | Wt 188.1 lb

## 2019-10-10 DIAGNOSIS — R001 Bradycardia, unspecified: Secondary | ICD-10-CM

## 2019-10-10 DIAGNOSIS — I8290 Acute embolism and thrombosis of unspecified vein: Secondary | ICD-10-CM | POA: Diagnosis not present

## 2019-10-10 DIAGNOSIS — R55 Syncope and collapse: Secondary | ICD-10-CM | POA: Diagnosis not present

## 2019-10-10 DIAGNOSIS — I10 Essential (primary) hypertension: Secondary | ICD-10-CM | POA: Diagnosis not present

## 2019-10-10 NOTE — Patient Instructions (Signed)
Medication Instructions:  The current medical regimen is effective;  continue present plan and medications.  *If you need a refill on your cardiac medications before your next appointment, please call your pharmacy*  You have been referred to one of our Electrophysiologist for further evaluation of syncope.  Follow-Up: Follow up with Dr Elease Hashimoto as needed.  Thank you for choosing [facility]!!

## 2019-10-14 ENCOUNTER — Telehealth: Payer: Self-pay | Admitting: Family Medicine

## 2019-10-14 NOTE — Telephone Encounter (Signed)
Amber from [employer] is needing verbal orders for the patient to receive Physical Therapy.

## 2019-10-14 NOTE — Telephone Encounter (Signed)
Left Detailed message ok for verbal orders

## 2019-10-22 ENCOUNTER — Telehealth: Payer: Self-pay

## 2019-10-22 NOTE — Telephone Encounter (Signed)
Spoke with [facility] ok for verbal

## 2019-10-22 NOTE — Telephone Encounter (Signed)
Physical therapist calling regarding rep states that the pt is coming to the end of his session. Patient still need physical therapy. Rep would like to see patient  Twice a week for 2 weeks  Once a week for 2 weeks   Name: [facility] [phone]

## 2019-10-22 NOTE — Telephone Encounter (Signed)
Ok with me. Please place any necessary orders. 

## 2019-10-25 ENCOUNTER — Telehealth (HOSPITAL_COMMUNITY): Payer: Self-pay

## 2019-10-25 NOTE — Telephone Encounter (Signed)

## 2019-10-28 ENCOUNTER — Other Ambulatory Visit: Payer: Self-pay | Admitting: *Deleted

## 2019-10-28 ENCOUNTER — Encounter: Payer: Self-pay | Admitting: Internal Medicine

## 2019-10-28 ENCOUNTER — Ambulatory Visit (INDEPENDENT_AMBULATORY_CARE_PROVIDER_SITE_OTHER): Payer: Medicare Other | Admitting: Internal Medicine

## 2019-10-28 ENCOUNTER — Other Ambulatory Visit: Payer: Self-pay

## 2019-10-28 VITALS — BP 110/68 | HR 57 | Ht 73.0 in | Wt 199.2 lb

## 2019-10-28 DIAGNOSIS — I714 Abdominal aortic aneurysm, without rupture, unspecified: Secondary | ICD-10-CM

## 2019-10-28 DIAGNOSIS — R55 Syncope and collapse: Secondary | ICD-10-CM

## 2019-10-28 DIAGNOSIS — R001 Bradycardia, unspecified: Secondary | ICD-10-CM | POA: Diagnosis not present

## 2019-10-28 MED ORDER — TORSEMIDE 10 MG PO TABS: 20.0000 mg | ORAL_TABLET | Freq: Every day | ORAL | 3 refills | Status: DC

## 2019-10-28 NOTE — Progress Notes (Signed)
ELECTROPHYSIOLOGY CONSULT NOTE  Patient ID: CODEN FRANCHI, MRN: [id number], DOB/AGE: January 27, 1935 85 y.o. Admit date: (Not on file) Date of Consult: 10/28/2019  Primary Physician: Ardith Dark, MD Primary Cardiologist: Marliss Czar     Steven Kent is a 85 y.o. male who is being seen today for the evaluation of Bradycardia at the request of Dr Marliss Czar.    HPI Steven Kent is a 85 y.o. male referred because of documented bradycardia.  He has a history of a stroke in 2014.  Since that time he has had repeated episodes occurring a couple times a year where he becomes unresponsive for minutes.  There is an impression that he is also stops breathing.  They are associated with diaphoresis.  When he begins to respond he is quite confused for minutes prior to becoming more normally engaging.  Since his stroke he has poor memory.  He does not recollect these events.  The information comes from his wife.  She has noted the diaphoresis.  She has not noted pallor.  She has noted him to have seizure activity.  Seen in hospital 1/21 having been found unresponsive.  EMS run note reported a blood pressure of 96/52 with a pulse of 52 on their arrival a few moments later was 130/69 with a pulse of 44  She does not recall him ever having had a seizure work-up.    DATE TEST EF   5/20 Echo   55-60 %   1/21 Echo   55-60 %         History of DVT and prior stroke on rivaroxaban.  He is wheelchair bound.   Past Medical History:  Diagnosis Date  . Abdominal aortic aneurysm (AAA) (HCC)   . Hypertension   . Stroke [facility])       Surgical History: History reviewed. No pertinent surgical history.   Home Meds: Current Meds  Medication Sig  . atorvastatin (LIPITOR) 40 MG tablet TAKE 1 TABLET(40 MG) BY MOUTH EVERY EVENING  . brimonidine (ALPHAGAN) 0.15 % ophthalmic solution Place 1 drop into both eyes daily.  . dorzolamide (TRUSOPT) 2 % ophthalmic solution Place 1 drop into both eyes 2 (two)  times daily.  Marland Kitchen gabapentin (NEURONTIN) 400 MG capsule TAKE 1 CAPSULE(400 MG) BY MOUTH TWICE DAILY  . latanoprost (XALATAN) 0.005 % ophthalmic solution Place 1 drop into both eyes at bedtime.  Marland Kitchen oxybutynin (DITROPAN) 5 MG tablet Take 1 tablet (5 mg total) by mouth 2 (two) times daily.  Marland Kitchen torsemide (DEMADEX) 10 MG tablet Take 1 tablet (10 mg total) by mouth daily.  . Vitamin D, Ergocalciferol, (DRISDOL) 1.25 MG (50000 UT) CAPS capsule Take 1 capsule (50,000 Units total) by mouth every 7 (seven) days.  Carlena Hurl 20 MG TABS tablet TAKE 1 TABLET(20 MG) BY MOUTH EVERY EVENING    Allergies:  Allergies  Allergen Reactions  . Aspirin Other (See Comments)    Full Strength Asprin-325 mg causes upset stomach. Can take 81 mg.    Social History   Socioeconomic History  . Marital status: Married    Spouse name: Not on file  . Number of children: Not on file  . Years of education: Not on file  . Highest education level: Not on file  Occupational History  . Not on file  Tobacco Use  . Smoking status: Former Games developer  . Smokeless tobacco: Never Used  Substance and Sexual Activity  . Alcohol use: Never  . Drug use: Never  .  Sexual activity: Not on file  Other Topics Concern  . Not on file  Social History Narrative  . Not on file   Social Determinants of Health   Financial Resource Strain:   . Difficulty of Paying Living Expenses:   Food Insecurity:   . Worried About Charity fundraiser in the Last Year:   . Arboriculturist in the Last Year:   Transportation Needs:   . Film/video editor (Medical):   Marland Kitchen Lack of Transportation (Non-Medical):   Physical Activity:   . Days of Exercise per Week:   . Minutes of Exercise per Session:   Stress:   . Feeling of Stress :   Social Connections:   . Frequency of Communication with Friends and Family:   . Frequency of Social Gatherings with Friends and Family:   . Attends Religious Services:   . Active Member of Clubs or Organizations:   .  Attends Archivist Meetings:   Marland Kitchen Marital Status:   Intimate Partner Violence:   . Fear of Current or Ex-Partner:   . Emotionally Abused:   Marland Kitchen Physically Abused:   . Sexually Abused:      Family History  Problem Relation Age of Onset  . Prostate cancer Neg Hx      ROS:  Please see the history of present illness.     All other systems reviewed and negative.    Physical Exam:  Blood pressure 110/68, pulse (!) 57, height 6\' 1"  (1.854 m), weight 199 lb 3.2 oz (90.4 kg), SpO2 98 %. General: Well developed, well nourished male in no acute distress. Head: Normocephalic, atraumatic, sclera non-icteric, no xanthomas, nares are without discharge. EENT: normal; right eye covered Lymph Nodes:  none Neck: Negative for carotid bruits.   Back:without scoliosis kyphosis  Lungs: Clear bilaterally to auscultation without wheezes, rales, or rhonchi. Breathing is unlabored. Heart: RRR with S1 S2. No murmur . No rubs, or gallops appreciated. Abdomen: Soft, non-tender, non-distended with normoactive bowel sounds. No hepatomegaly. No rebound/guarding. No obvious abdominal masses. Msk:  Strength and tone appear normal for age. Extremities: No clubbing or cyanosis.  2-3+r  2+Ledema.  Distal pedal pulses are 2+ and equal bilaterally. Skin: Warm and Dry Neuro: Alert that he remember your name April. CN III-XII intact right hemiparesis  psych:  Responds to questions appropriately with a normal affect.      Labs: Cardiac Enzymes No results for input(s): CKTOTAL, CKMB, TROPONINI in the last 72 hours. CBC Lab Results  Component Value Date   WBC 5.2 08/25/2019   HGB 13.9 08/25/2019   HCT 43.6 08/25/2019   MCV 100.0 08/25/2019   PLT 110 (L) 08/25/2019   PROTIME: No results for input(s): LABPROT, INR in the last 72 hours. Chemistry No results for input(s): NA, K, CL, CO2, BUN, CREATININE, CALCIUM, PROT, BILITOT, ALKPHOS, ALT, AST, GLUCOSE in the last 168 hours.  Invalid input(s):  LABALBU Lipids Lab Results  Component Value Date   CHOL 133 02/12/2019   HDL 47.70 02/12/2019   LDLCALC 74 02/12/2019   TRIG 52.0 02/12/2019   BNP No results found for: PROBNP Thyroid Function Tests: No results for input(s): TSH, T4TOTAL, T3FREE, THYROIDAB in the last 72 hours.  Invalid input(s): FREET3 Miscellaneous No results found for: DDIMER  Radiology/Studies:  No results found.  EKG: sinus @ 57  28/10/42  08/26/2019 sinus rhythm 40 Intervals 30/08/40  08/23/2019 sinus at 47 Intervals 36/10/49  08/01/2018 sinus rhythm at 71 Intervals 30/10/44  Assessment and Plan:   Stroke w residual HP  Spells  Bradycardia  VTE  Hypertension     The original EMS from note suggest that he was more hypotensive and bradycardic on their arrival.  Furthermore, not withstanding his bradycardia, his blood pressures have been normal.  The supposition in the past that he has been dehydrated has given rise to diagnosis of dehydration.  However, I wonder, in the temporal relationship to his stroke, and the fact that his wife has witnessed seizure-like activity, whether these or not seizures.  He is averse but perhaps willing to undergo loop recorder insertion.  I doubt that he would be willing to undergo pacemaker implantation.  We have agreed on the following plan, that he would go for neurology evaluation first for the possibility that these are seizures and if this is negative we would consider implanting a loop recorder.  He is volume overloaded; we will increase his diuretic torsemide 10--20.  We have discussed the importance of a low-sodium diet.        Sherryl Manges

## 2019-10-28 NOTE — Patient Instructions (Addendum)
Medication Instructions:    Increase your Torsemide to 20mg  (2 tablets daily by mouth)  *If you need a refill on your cardiac medications before your next appointment, please call your pharmacy*   Lab Work: None ordered.  If you have labs (blood work) drawn today and your tests are completely normal, you will receive your results only by: MyChart Message (if you have MyChart) OR . A paper copy in the mail If you have any lab test that is abnormal or we need to change your treatment, we will call you to review the results.   Testing/Procedures:  Referral made to Neurology   Follow-Up: At [facility], you and your health needs are our priority.  As part of our continuing mission to provide you with exceptional heart care, we have created designated Provider Care Teams.  These Care Teams include your primary Cardiologist (physician) and Advanced Practice Providers (APPs -  Physician Assistants and Nurse Practitioners) who all work together to provide you with the care you need, when you need it.  We recommend signing up for the patient portal called "MyChart".  Sign up information is provided on this After Visit Summary.  MyChart is used to connect with patients for Virtual Visits (Telemedicine).  Patients are able to view lab/test results, encounter notes, upcoming appointments, etc.  Non-urgent messages can be sent to your provider as well.   To learn more about what you can do with MyChart, go to [facility].    Your next appointment:  As needed with Dr [email or web address]   Provider:   You may see Dr Graciela Husbands or one of the following Advanced Practice Providers on your designated Care Team:    Graciela Husbands, NP  Gypsy Balsam, PA-C  Francis Dowse "[place]real" [place], [place]

## 2019-10-29 ENCOUNTER — Encounter: Payer: Self-pay | Admitting: Vascular Surgery

## 2019-10-29 ENCOUNTER — Other Ambulatory Visit: Payer: Self-pay

## 2019-10-29 ENCOUNTER — Ambulatory Visit (INDEPENDENT_AMBULATORY_CARE_PROVIDER_SITE_OTHER): Payer: Medicare Other | Admitting: Vascular Surgery

## 2019-10-29 ENCOUNTER — Ambulatory Visit (HOSPITAL_COMMUNITY)
Admission: RE | Admit: 2019-10-29 | Discharge: 2019-10-29 | Disposition: A | Discharge status: Home / Self Care | Payer: Medicare Other | Source: Ambulatory Visit | Attending: Vascular Surgery | Admitting: Vascular Surgery

## 2019-10-29 ENCOUNTER — Encounter: Payer: Self-pay | Admitting: Neurology

## 2019-10-29 VITALS — BP 122/69 | HR 51 | Temp 97.9°F | Resp 18 | Ht 73.5 in | Wt 198.0 lb

## 2019-10-29 DIAGNOSIS — I714 Abdominal aortic aneurysm, without rupture, unspecified: Secondary | ICD-10-CM

## 2019-10-29 NOTE — Progress Notes (Signed)
[facility] [place]  Patient name: Steven Kent MRN: [id number] DOB: 1934-11-12 Sex: male  REASON FOR VISIT: Follow-up known abdominal aortic aneurysm  HPI: Steven Kent is a 85 y.o. male here today for follow-up.  I am also discussing his case by telephone with his wife due to Covid visitation restrictions.  The is providing the majority of his history.  She reports that he had an episode of unsafe responsiveness and is having a cardiac work-up for this.  He has no symptoms referable to his aneurysm  Past Medical History:  Diagnosis Date  . Abdominal aortic aneurysm (AAA) (HCC)   . Hypertension   . Stroke [facility])     Family History  Problem Relation Age of Onset  . Prostate cancer Neg Hx     SOCIAL HISTORY: Social History   Tobacco Use  . Smoking status: Former Games developer  . Smokeless tobacco: Never Used  Substance Use Topics  . Alcohol use: Never    Allergies  Allergen Reactions  . Aspirin Other (See Comments)    Full Strength Asprin-325 mg causes upset stomach. Can take 81 mg.    Current Outpatient Medications  Medication Sig Dispense Refill  . atorvastatin (LIPITOR) 40 MG tablet TAKE 1 TABLET(40 MG) BY MOUTH EVERY EVENING 90 tablet 3  . brimonidine (ALPHAGAN) 0.15 % ophthalmic solution Place 1 drop into both eyes daily.    . dorzolamide (TRUSOPT) 2 % ophthalmic solution Place 1 drop into both eyes 2 (two) times daily.    Marland Kitchen gabapentin (NEURONTIN) 400 MG capsule TAKE 1 CAPSULE(400 MG) BY MOUTH TWICE DAILY 180 capsule 3  . latanoprost (XALATAN) 0.005 % ophthalmic solution Place 1 drop into both eyes at bedtime.    Marland Kitchen oxybutynin (DITROPAN) 5 MG tablet Take 1 tablet (5 mg total) by mouth 2 (two) times daily. 180 tablet 3  . torsemide (DEMADEX) 10 MG tablet Take 2 tablets (20 mg total) by mouth daily. 180 tablet 3  . Vitamin D, Ergocalciferol, (DRISDOL) 1.25 MG (50000 UT) CAPS capsule Take 1 capsule (50,000  Units total) by mouth every 7 (seven) days. 12 capsule 3  . XARELTO 20 MG TABS tablet TAKE 1 TABLET(20 MG) BY MOUTH EVERY EVENING 90 tablet 3   No current facility-administered medications for this visit.    REVIEW OF SYSTEMS:  [X]  denotes positive finding, [ ]  denotes negative finding Cardiac  Comments:  Chest pain or chest pressure:    Shortness of breath upon exertion:    Short of breath when lying flat:    Irregular heart rhythm:        Vascular    Pain in calf, thigh, or hip brought on by ambulation:    Pain in feet at night that wakes you up from your sleep:     Blood clot in your veins:    Leg swelling:           PHYSICAL EXAM: Vitals:   10/29/19 0937  BP: 122/69  Pulse: (!) 51  Resp: 18  Temp: 97.9 F (36.6 C)  TempSrc: Temporal  SpO2: 100%  Weight: 198 lb (89.8 kg)  Height: 6' 1.5" (1.867 m)    GENERAL: The patient is a well-nourished male, in no acute distress. The vital signs are documented above. CARDIOVASCULAR: Palpable radial pulses and dorsalis pedis pulses bilaterally PULMONARY: There is good air exchange  MUSCULOSKELETAL: There are no major deformities or cyanosis. NEUROLOGIC: No focal weakness or paresthesias are detected. SKIN: There are  no ulcers or rashes noted. PSYCHIATRIC: The patient has a normal affect.  DATA:  Aortic duplex today reveals maximal diameter of 4.7 which is up slightly from 4.41-year ago  MEDICAL ISSUES: Very debilitated 85 year old with slight increase in aneurysm size.  Recommend ultrasound follow-up in 1 year    Rosetta Posner, MD [facility] Tel [phone] Pager [phone]

## 2019-10-30 ENCOUNTER — Other Ambulatory Visit: Payer: Self-pay | Admitting: *Deleted

## 2019-10-30 DIAGNOSIS — I714 Abdominal aortic aneurysm, without rupture, unspecified: Secondary | ICD-10-CM

## 2019-10-30 NOTE — Addendum Note (Signed)
Addended by: Solon Augusta on: 10/30/2019 04:45 PM   Modules accepted: Orders

## 2019-11-01 ENCOUNTER — Telehealth: Payer: Self-pay | Admitting: Internal Medicine

## 2019-11-01 NOTE — Telephone Encounter (Signed)
New message     Calling to give Dr Graciela Husbands past cardiologist info: Dr Darius Bump Invasive cardiologist [address] [place], [place] [address] Phone----[phone] Fax---[phone]

## 2019-11-05 ENCOUNTER — Other Ambulatory Visit: Payer: Self-pay | Admitting: *Deleted

## 2019-11-05 MED ORDER — OXYBUTYNIN CHLORIDE 5 MG PO TABS: 5.0000 mg | ORAL_TABLET | Freq: Two times a day (BID) | ORAL | 3 refills | Status: DC

## 2019-11-17 ENCOUNTER — Encounter (HOSPITAL_COMMUNITY): Payer: Self-pay

## 2019-11-17 ENCOUNTER — Emergency Department (HOSPITAL_COMMUNITY)
Admission: EM | Admit: 2019-11-17 | Discharge: 2019-11-17 | Disposition: A | Discharge status: Home / Self Care | Payer: Medicare Other | Attending: Emergency Medicine | Admitting: Emergency Medicine

## 2019-11-17 ENCOUNTER — Emergency Department (HOSPITAL_COMMUNITY): Payer: Medicare Other

## 2019-11-17 ENCOUNTER — Other Ambulatory Visit: Payer: Self-pay

## 2019-11-17 DIAGNOSIS — Y939 Activity, unspecified: Secondary | ICD-10-CM | POA: Diagnosis not present

## 2019-11-17 DIAGNOSIS — Z7901 Long term (current) use of anticoagulants: Secondary | ICD-10-CM

## 2019-11-17 DIAGNOSIS — Y92013 Bedroom of single-family (private) house as the place of occurrence of the external cause: Secondary | ICD-10-CM | POA: Insufficient documentation

## 2019-11-17 DIAGNOSIS — S01312A Laceration without foreign body of left ear, initial encounter: Secondary | ICD-10-CM | POA: Insufficient documentation

## 2019-11-17 DIAGNOSIS — S0090XA Unspecified superficial injury of unspecified part of head, initial encounter: Secondary | ICD-10-CM

## 2019-11-17 DIAGNOSIS — S0990XA Unspecified injury of head, initial encounter: Secondary | ICD-10-CM | POA: Insufficient documentation

## 2019-11-17 DIAGNOSIS — W06XXXA Fall from bed, initial encounter: Secondary | ICD-10-CM | POA: Insufficient documentation

## 2019-11-17 DIAGNOSIS — R296 Repeated falls: Secondary | ICD-10-CM | POA: Insufficient documentation

## 2019-11-17 DIAGNOSIS — Y999 Unspecified external cause status: Secondary | ICD-10-CM | POA: Insufficient documentation

## 2019-11-17 LAB — I-STAT CHEM 8, ED
BUN: 16 mg/dL (ref 8–23)
Calcium, Ion: 1.21 mmol/L (ref 1.15–1.40)
Chloride: 100 mmol/L (ref 98–111)
Creatinine, Ser: 1.2 mg/dL (ref 0.61–1.24)
Glucose, Bld: 91 mg/dL (ref 70–99)
HCT: 42 % (ref 39.0–52.0)
Hemoglobin: 14.3 g/dL (ref 13.0–17.0)
Potassium: 3.5 mmol/L (ref 3.5–5.1)
Sodium: 141 mmol/L (ref 135–145)
TCO2: 29 mmol/L (ref 22–32)

## 2019-11-17 NOTE — ED Notes (Signed)
Pt discharge instructions reviewed with pt wife. Verbalized understanding of instructions. Pt discharged.

## 2019-11-17 NOTE — ED Triage Notes (Addendum)
Pt BIB [facility] for fall on thinners. Fallx2 at home. First fall was slip from wheelchair, family was able to get pt up. 2nd fall was rolling out of bed, hit head on nightstand. Pt on xarelto (for afib?). Pt has R sided deficits from previous CVA. Pt denies pain but endorses R sided leg cramp. MD Cardama at bedside upon arrival, to place orders

## 2019-11-17 NOTE — ED Provider Notes (Signed)
[facility] EMERGENCY DEPARTMENT Provider Note  CSN: [id number] Arrival date & time: 11/17/19 0507  Chief Complaint(s) Level 2 and Fall on Thinners  HPI Steven Kent is a 85 y.o. 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  Patient fell twice at home today.  First fall was slipped from wheelchair with no head trauma.  Second fall was rolling out of bed hitting his head on a nightstand.  No loss of consciousness.  Patient denies any headache, neck pain, back pain, chest pain, abdominal pain, extremity pain.  Brought in as a level 2 trauma due to fall on anticoagulation. Hemodynamically stable in route.  HPI  Past Medical History History reviewed. No pertinent past medical history. There are no problems to display for this patient.  Home Medication(s) Prior to Admission medications   Not on File                                                                                                                                    Past Surgical History History reviewed. No pertinent surgical history. Family History History reviewed. No pertinent family history.  Social History Social History   Tobacco Use  . Smoking status: Not on file  Substance Use Topics  . Alcohol use: Not on file  . Drug use: Not on file   Allergies Patient has no allergy information on record.  Review of Systems Review of Systems All other systems are reviewed and are negative for acute change except as noted in the HPI  Physical Exam Vital Signs  I have reviewed the triage vital signs BP (!) 144/74   Pulse 61   Temp 97.9 F (36.6 C) (Oral)   Resp 16   Ht 6\' 2"  (1.88 m)   Wt 88.5 kg   SpO2 99%   BMI 25.04 kg/m   Physical Exam Constitutional:      General: She is not in acute distress.    Appearance: She is well-developed. She is not diaphoretic.  HENT:     Head: Normocephalic and  atraumatic.     Right Ear: External ear normal.     Left Ear: External ear normal.     Ears:      Nose: Nose normal.  Eyes:     General: No scleral icterus.       Right eye: No discharge.        Left eye: No discharge.     Conjunctiva/sclera: Conjunctivae normal.     Pupils: Pupils are equal, round, and reactive to light.  Cardiovascular:     Rate and Rhythm: Normal rate and regular rhythm.     Pulses:          Radial pulses are 2+ on the right side and 2+ on the left side.       Dorsalis  pedis pulses are 2+ on the right side and 2+ on the left side.     Heart sounds: Normal heart sounds. No murmur. No friction rub. No gallop.   Pulmonary:     Effort: Pulmonary effort is normal. No respiratory distress.     Breath sounds: Normal breath sounds. No stridor. No wheezing.  Abdominal:     General: There is no distension.     Palpations: Abdomen is soft.     Tenderness: There is no abdominal tenderness.  Musculoskeletal:        General: No tenderness.     Cervical back: Normal range of motion and neck supple. No bony tenderness.     Thoracic back: No bony tenderness.     Lumbar back: No bony tenderness.     Right lower leg: 1+ Pitting Edema present.     Left lower leg: 1+ Pitting Edema present.     Comments: Clavicles stable. Chest stable to AP/Lat compression. Pelvis stable to Lat compression. No obvious extremity deformity. No chest or abdominal wall contusion.  Skin:    General: Skin is warm and dry.     Findings: No erythema or rash.  Neurological:     Mental Status: She is alert and oriented to person, place, and time.     Comments: Moving all extremities     ED Results and Treatments Labs (all labs ordered are listed, but only abnormal results are displayed) Labs Reviewed  I-STAT CHEM 8, ED  I-STAT CHEM 8, ED                                                                                                                         EKG  EKG  Interpretation  Date/Time:    Ventricular Rate:    PR Interval:    QRS Duration:   QT Interval:    QTC Calculation:   R Axis:     Text Interpretation:        Radiology CT Head Wo Contrast  Result Date: 11/17/2019 CLINICAL DATA:  Multiple falls. Facial trauma. Currently on Xarelto. EXAM: CT HEAD WITHOUT CONTRAST CT CERVICAL SPINE WITHOUT CONTRAST TECHNIQUE: Multidetector CT imaging of the head and cervical spine was performed following the standard protocol without intravenous contrast. Multiplanar CT image reconstructions of the cervical spine were also generated. COMPARISON:  Head CT 08/23/2019 FINDINGS: CT HEAD FINDINGS Brain: There is no mass, hemorrhage or extra-axial collection. The size and configuration of the ventricles and extra-axial CSF spaces are normal. There is hypoattenuation of the periventricular white matter, most commonly indicating chronic ischemic microangiopathy. Vascular: No abnormal hyperdensity of the major intracranial arteries or dural venous sinuses. No intracranial atherosclerosis. Skull: The visualized skull base, calvarium and extracranial soft tissues are normal. Sinuses/Orbits: No fluid levels or advanced mucosal thickening of the visualized paranasal sinuses. No mastoid or middle ear effusion. The orbits are normal. CT CERVICAL SPINE FINDINGS Alignment: No static subluxation. Facets are aligned. Occipital condyles are normally positioned. Skull  base and vertebrae: No acute fracture. Soft tissues and spinal canal: No prevertebral fluid or swelling. No visible canal hematoma. Disc levels: Mild-to-moderate spinal canal stenosis at C6-7 due to ossification of the posterior longitudinal ligament. Upper chest: No pneumothorax, pulmonary nodule or pleural effusion. Other: Normal visualized paraspinal cervical soft tissues. IMPRESSION: 1. No acute intracranial abnormality. 2. No acute fracture or static subluxation of the cervical spine. 3. Mild-to-moderate C6-7 spinal canal  stenosis. Electronically Signed   By: Deatra Robinson M.D.   On: 11/17/2019 06:35   CT Cervical Spine Wo Contrast  Result Date: 11/17/2019 CLINICAL DATA:  Multiple falls. Facial trauma. Currently on Xarelto. EXAM: CT HEAD WITHOUT CONTRAST CT CERVICAL SPINE WITHOUT CONTRAST TECHNIQUE: Multidetector CT imaging of the head and cervical spine was performed following the standard protocol without intravenous contrast. Multiplanar CT image reconstructions of the cervical spine were also generated. COMPARISON:  Head CT 08/23/2019 FINDINGS: CT HEAD FINDINGS Brain: There is no mass, hemorrhage or extra-axial collection. The size and configuration of the ventricles and extra-axial CSF spaces are normal. There is hypoattenuation of the periventricular white matter, most commonly indicating chronic ischemic microangiopathy. Vascular: No abnormal hyperdensity of the major intracranial arteries or dural venous sinuses. No intracranial atherosclerosis. Skull: The visualized skull base, calvarium and extracranial soft tissues are normal. Sinuses/Orbits: No fluid levels or advanced mucosal thickening of the visualized paranasal sinuses. No mastoid or middle ear effusion. The orbits are normal. CT CERVICAL SPINE FINDINGS Alignment: No static subluxation. Facets are aligned. Occipital condyles are normally positioned. Skull base and vertebrae: No acute fracture. Soft tissues and spinal canal: No prevertebral fluid or swelling. No visible canal hematoma. Disc levels: Mild-to-moderate spinal canal stenosis at C6-7 due to ossification of the posterior longitudinal ligament. Upper chest: No pneumothorax, pulmonary nodule or pleural effusion. Other: Normal visualized paraspinal cervical soft tissues. IMPRESSION: 1. No acute intracranial abnormality. 2. No acute fracture or static subluxation of the cervical spine. 3. Mild-to-moderate C6-7 spinal canal stenosis. Electronically Signed   By: Deatra Robinson M.D.   On: 11/17/2019 06:35     Pertinent labs & imaging results that were available during my care of the patient were reviewed by me and considered in my medical decision making (see chart for details).  Medications Ordered in ED Medications - No data to display                                                                                                                                  Procedures Procedures  (including critical care time)  Medical Decision Making / ED Course I have reviewed the nursing notes for this encounter and the patient's prior records (if available in EHR or on provided paperwork).   Tyreck Bell Vinluan was evaluated in Emergency Department on 11/17/2019 for the symptoms described in the history of present illness. He was evaluated in the context of the global COVID-19 pandemic, which necessitated consideration that the  patient might be at risk for infection with the SARS-CoV-2 virus that causes COVID-19. Institutional protocols and algorithms that pertain to the evaluation of patients at risk for COVID-19 are in a state of rapid change based on information released by regulatory bodies including the CDC and federal and state organizations. These policies and algorithms were followed during the patient's care in the ED.  Level 2 trauma ABCs intact Secondary as above CT head and cervical spine negative. istat chem 8 wnl       Final Clinical Impression(s) / ED Diagnoses Final diagnoses:  Fall from bed, initial encounter  Minor head trauma  Anticoagulated   The patient appears reasonably screened and/or stabilized for discharge and I doubt any other medical condition or other [facility] requiring further screening, evaluation, or treatment in the ED at this time prior to discharge. Safe for discharge with strict return precautions.  Disposition: Discharge  Condition: Good  I have discussed the results, Dx and Tx plan with the patient/family who expressed understanding and agree(s) with  the plan. Discharge instructions discussed at length. The patient/family was given strict return precautions who verbalized understanding of the instructions. No further questions at time of discharge.    ED Discharge Orders    None       Follow Up: Primary care provider  Schedule an appointment as soon as possible for a visit        This chart was dictated using voice recognition software.  Despite best efforts to proofread,  errors can occur which can change the documentation meaning.   Fatima Blank, MD 11/17/19 [phone]

## 2019-11-17 NOTE — ED Notes (Signed)
Pt transported to CT via stretcher. VSS, NAD noted

## 2019-11-17 NOTE — ED Notes (Signed)
Urine sample at bedside if necessary

## 2019-11-18 ENCOUNTER — Encounter: Payer: Self-pay | Admitting: Vascular Surgery

## 2019-12-03 ENCOUNTER — Ambulatory Visit (INDEPENDENT_AMBULATORY_CARE_PROVIDER_SITE_OTHER): Payer: Medicare Other | Admitting: Podiatry

## 2019-12-03 ENCOUNTER — Encounter: Payer: Self-pay | Admitting: Podiatry

## 2019-12-03 ENCOUNTER — Other Ambulatory Visit: Payer: Self-pay

## 2019-12-03 VITALS — Temp 97.8°F

## 2019-12-03 DIAGNOSIS — G629 Polyneuropathy, unspecified: Secondary | ICD-10-CM

## 2019-12-03 DIAGNOSIS — M79675 Pain in left toe(s): Secondary | ICD-10-CM

## 2019-12-03 DIAGNOSIS — M79674 Pain in right toe(s): Secondary | ICD-10-CM

## 2019-12-03 DIAGNOSIS — B351 Tinea unguium: Secondary | ICD-10-CM | POA: Diagnosis not present

## 2019-12-03 DIAGNOSIS — R609 Edema, unspecified: Secondary | ICD-10-CM

## 2019-12-03 NOTE — Progress Notes (Signed)
This patient returns to my office for at risk foot care.  This patient requires this care by a professional since this patient will be at risk due to having kidney disease,  CVA and neuropathy.  This patient is unable to cut nails himself since the patient cannot reach his nails.These nails are painful walking and wearing shoes.  This patient presents for at risk foot care today.  General Appearance  Alert, conversant and in no acute stress.  Vascular  Dorsalis pedis and posterior tibial  pulses are minimally  palpable  Left foot.  Absent pulses right foot due to swelling..  Capillary return is within normal limits  bilaterally. Temperature is within normal limits  bilaterally.  Neurologic  Senn-Weinstein monofilament wire test within normal limits  bilaterally. Muscle power within normal limits bilaterally.  Nails Thick disfigured discolored nails with subungual debris  from hallux to fifth toes bilaterally. No evidence of bacterial infection or drainage bilaterally.  Orthopedic  No limitations of motion  feet .  No crepitus or effusions noted.  No bony pathology or digital deformities noted.  Skin  normotropic skin with no porokeratosis noted bilaterally.  No signs of infections or ulcers noted.     Onychomycosis  Pain in right toes  Pain in left toes  Consent was obtained for treatment procedures.   Mechanical debridement of nails 1-5  bilaterally performed with a nail nipper.  Filed with dremel without incident.    Return office visit   3 months                    Told patient to return for periodic foot care and evaluation due to potential at risk complications.   Helane Gunther DPM

## 2019-12-06 ENCOUNTER — Other Ambulatory Visit: Payer: Self-pay

## 2019-12-09 ENCOUNTER — Encounter: Payer: Self-pay | Admitting: Family Medicine

## 2019-12-09 ENCOUNTER — Ambulatory Visit (INDEPENDENT_AMBULATORY_CARE_PROVIDER_SITE_OTHER): Payer: Medicare Other | Admitting: Family Medicine

## 2019-12-09 ENCOUNTER — Other Ambulatory Visit: Payer: Self-pay

## 2019-12-09 VITALS — BP 142/66 | HR 58 | Temp 97.7°F | Wt 186.2 lb

## 2019-12-09 DIAGNOSIS — R5381 Other malaise: Secondary | ICD-10-CM | POA: Diagnosis not present

## 2019-12-09 DIAGNOSIS — I69351 Hemiplegia and hemiparesis following cerebral infarction affecting right dominant side: Secondary | ICD-10-CM

## 2019-12-09 DIAGNOSIS — I2510 Atherosclerotic heart disease of native coronary artery without angina pectoris: Secondary | ICD-10-CM

## 2019-12-09 DIAGNOSIS — I10 Essential (primary) hypertension: Secondary | ICD-10-CM | POA: Diagnosis not present

## 2019-12-09 NOTE — Assessment & Plan Note (Signed)
Stable.  Continue statin and Xarelto. 

## 2019-12-09 NOTE — Patient Instructions (Signed)
It was very nice to see you today!  No changes today.  Come back in 6 months, or sooner if needed.  Take care, Dr [name]  Please try these tips to maintain a healthy lifestyle:   Eat at least 3 REAL meals and 1-2 snacks per day.  Aim for no more than 5 hours between eating.  If you eat breakfast, please do so within one hour of getting up.    Each meal should contain half fruits/vegetables, one quarter protein, and one quarter carbs (no bigger than a computer mouse)   Cut down on sweet beverages. This includes juice, soda, and sweet tea.     Drink at least 1 glass of water with each meal and aim for at least 8 glasses per day   Exercise at least 150 minutes every week.

## 2019-12-09 NOTE — Assessment & Plan Note (Signed)
Possibly could have contributed to his fall.  We will continue working with physical therapy.

## 2019-12-09 NOTE — Progress Notes (Signed)
   Steven Kent is a 85 y.o. male who presents today for an office visit.  Assessment/Plan:  New/Acute Problems: Fall Happened about a month ago.  Was evaluated in the emergency room.  Has not fallen since.  Has been working with home physical therapy which seems to be helping.  Chronic Problems Addressed Today: Essential hypertension At goal off medications.  Hemiparesis affecting right side as late effect of cerebrovascular accident [facility]) Possibly could have contributed to his fall.  We will continue working with physical therapy.  Coronary artery disease involving native heart without angina pectoris Stable.  Continue statin and Xarelto.     Subjective:  HPI:  Patient here with his wife for follow-up.  Unfortunately he fell about a month ago.  Went to emergency room.  Had head CT scan which was negative.  Has been working with home physical therapy.  Thinks that he fell due to not putting rail up on his bed.       Objective:  Physical Exam: BP (!) 142/66   Pulse (!) 58   Temp 97.7 F (36.5 C)   Wt 186 lb 3.2 oz (84.5 kg)   SpO2 97%   BMI 23.91 kg/m   Gen: No acute distress, resting comfortably CV: Regular rate and rhythm with no murmurs appreciated Pulm: Normal work of breathing, clear to auscultation bilaterally with no crackles, wheezes, or rhonchi Neuro: Grossly normal, moves all extremities Psych: Normal affect and thought content      Steven Vilar M. [name], MD 12/09/2019 10:43 AM

## 2019-12-09 NOTE — Assessment & Plan Note (Signed)
At goal off medications. 

## 2020-01-15 ENCOUNTER — Other Ambulatory Visit: Payer: Self-pay | Admitting: Family Medicine

## 2020-01-16 NOTE — Telephone Encounter (Signed)
Okay to refill Vit D? 

## 2020-02-04 ENCOUNTER — Other Ambulatory Visit: Payer: Self-pay

## 2020-02-04 ENCOUNTER — Ambulatory Visit (INDEPENDENT_AMBULATORY_CARE_PROVIDER_SITE_OTHER): Payer: Medicare Other | Admitting: Neurology

## 2020-02-04 ENCOUNTER — Encounter: Payer: Self-pay | Admitting: Neurology

## 2020-02-04 VITALS — BP 107/64 | HR 74 | Ht 74.0 in | Wt 196.6 lb

## 2020-02-04 DIAGNOSIS — R55 Syncope and collapse: Secondary | ICD-10-CM

## 2020-02-04 DIAGNOSIS — Z8673 Personal history of transient ischemic attack (TIA), and cerebral infarction without residual deficits: Secondary | ICD-10-CM

## 2020-02-04 NOTE — Patient Instructions (Signed)
1. Schedule MRI brain without contrast  2. Schedule 72-hour EEG  Our office will call with results, if unremarkable, follow-up as needed. Call for any changes.

## 2020-02-04 NOTE — Progress Notes (Signed)
NEUROLOGY CONSULTATION NOTE  [name] MRN: [id number] DOB: 1934-10-19  Referring provider: Dr. Sherryl Manges Primary care provider: Dr. Jacquiline Doe  Reason for consult:  syncope  Dear Dr Steven Kent:  Thank you for your kind referral of Steven Kent for consultation of the above symptoms. Although his history is well known to you, please allow me to reiterate it for the purpose of our medical record. The patient was accompanied to the clinic by his wife who also provides collateral information. Records and images were personally reviewed where available.   HISTORY OF PRESENT ILLNESS: This is an 85 year old right-handed man with a history of hypertension, bradycardia, DVT on Xarelto, stroke in 2014 with residual right hemiparesis, presenting for evaluation of recurrent episodes of decreased responsiveness. His wife is present to provide additional information. He has been mostly wheelchair-bound since the stroke. He denies any prior warning symptoms before the episodes, then feels fine after, no worsening right-sided weakness. His wife reports that he was in a SNF for a little over a year after the stroke. Once he was home, she started noticing these episodes. One time he was sitting at the breakfast bar and bowed his head like he was dozing. His eyes were closed, he was not responding even when she shook him. Then he started sliding down. His wife expressed frustration that each time they would bring him to the hospital in [place], they would send him home saying he was okay. She notes sometimes he would pass out when dehydrated, he likes to layer clothes and sit in the heat. He was having these episodes around 4-5 times a year, then had a different event in January 2021, his eyes were closed, he was not responding, then he started shaking on his wheelchair. He stopped breathing after, then as family was getting ready to put him on the floor, his eyes fluttered open and his speech was  slurred/garbled, she was unable to understand what he was saying. BP was noted to be 50/40, HR 40. He had an EEG which was within normal limits. Head CT no acute changes, there was atrophy and extensive chronic microvascular disease. His wife notes sometimes he has episodes where is eyes are open and unresponsive. He usually goes to lie down after the events. He denies any  olfactory/gustatory hallucinations, deja vu, rising epigastric sensation, myoclonic jerks. He has had right hand numbness and tingling since the stroke and takes gabapentin 400mg  BID, he is not sure if this is helping, he still has tingling. His hand is shaky sometimes that he has to hold it with his left hand. They report excessive moisture in his mouth that he is always spitting. He denies any headaches, dysarthria/dysphagia, neck/back pain, bowel dysfunction. He has urinary incontinence. He wears an eye patch because he feels it helps him see better out of the left eye. 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 Memory is fair, he denies any gaps in time. He has fallen a few times recently and hit his head.   Epilepsy Risk Factors:  His father had a seizure after head injury. Otherwise he had a normal birth and early development.  There is no history of febrile convulsions, CNS infections such as meningitis/encephalitis, significant traumatic brain injury, neurosurgical procedures, or family history of seizures.    PAST MEDICAL HISTORY: Past Medical History:  Diagnosis Date  . Abdominal aortic aneurysm (AAA) (HCC)   . Hypertension   . Stroke [facility])  PAST SURGICAL HISTORY: Past Surgical History:  Procedure Laterality Date  . CATARACT EXTRACTION, BILATERAL     rt eye 10/09/2014 lt eye 11/06/2014  . DENTAL SURGERY    . REFRACTIVE SURGERY  01/2015    MEDICATIONS: Current Outpatient Medications on File Prior to Visit  Medication Sig Dispense Refill  . Vitamin D, Ergocalciferol, (DRISDOL) 1.25 MG (50000  UNIT) CAPS capsule TAKE 1 CAPSULE BY MOUTH EVERY 7 DAYS 12 capsule 3  . atorvastatin (LIPITOR) 40 MG tablet TAKE 1 TABLET(40 MG) BY MOUTH EVERY EVENING 90 tablet 3  . brimonidine (ALPHAGAN) 0.15 % ophthalmic solution Place 1 drop into both eyes daily.    . dorzolamide (TRUSOPT) 2 % ophthalmic solution Place 1 drop into both eyes 2 (two) times daily.    Marland Kitchen gabapentin (NEURONTIN) 400 MG capsule TAKE 1 CAPSULE(400 MG) BY MOUTH TWICE DAILY 180 capsule 3  . latanoprost (XALATAN) 0.005 % ophthalmic solution Place 1 drop into both eyes at bedtime.    Marland Kitchen oxybutynin (DITROPAN) 5 MG tablet Take 1 tablet (5 mg total) by mouth 2 (two) times daily. 180 tablet 3  . torsemide (DEMADEX) 10 MG tablet Take 2 tablets (20 mg total) by mouth daily. 180 tablet 3  . XARELTO 20 MG TABS tablet TAKE 1 TABLET(20 MG) BY MOUTH EVERY EVENING 90 tablet 3   No current facility-administered medications on file prior to visit.    ALLERGIES: Allergies  Allergen Reactions  . Aspirin Other (See Comments)    Full Strength Asprin-325 mg causes upset stomach. Can take 81 mg.    FAMILY HISTORY: Family History  Problem Relation Age of Onset  . Prostate cancer Neg Hx     SOCIAL HISTORY: Social History   Socioeconomic History  . Marital status: Married    Spouse name: Not on file  . Number of children: Not on file  . Years of education: Not on file  . Highest education level: Not on file  Occupational History  . Not on file  Tobacco Use  . Smoking status: Former Games developer  . Smokeless tobacco: Never Used  Substance and Sexual Activity  . Alcohol use: Never  . Drug use: Never  . Sexual activity: Not on file  Other Topics Concern  . Not on file  Social History Narrative   ** Merged History Encounter **       Social Determinants of Health   Financial Resource Strain:   . Difficulty of Paying Living Expenses:   Food Insecurity:   . Worried About Programme researcher, broadcasting/film/video in the Last Year:   . Barista in the  Last Year:   Transportation Needs:   . Freight forwarder (Medical):   Marland Kitchen Lack of Transportation (Non-Medical):   Physical Activity:   . Days of Exercise per Week:   . Minutes of Exercise per Session:   Stress:   . Feeling of Stress :   Social Connections:   . Frequency of Communication with Friends and Family:   . Frequency of Social Gatherings with Friends and Family:   . Attends Religious Services:   . Active Member of Clubs or Organizations:   . Attends Banker Meetings:   Marland Kitchen Marital Status:   Intimate Partner Violence:   . Fear of Current or Ex-Partner:   . Emotionally Abused:   Marland Kitchen Physically Abused:   . Sexually Abused:     PHYSICAL EXAM: Vitals:   02/04/20 1353  BP: 107/64  Pulse: 74  SpO2:  98%   General: No acute distress, sitting on wheelchair Head:  Normocephalic/atraumatic Lungs: Clear to auscultation bilaterally. Vascular: No carotid bruits. Skin/Extremities: No rash, no edema Neurological Exam: Mental status: alert and oriented to person, place, and time, no dysarthria or aphasia, Fund of knowledge is appropriate.  Recent and remote memory are impaired.  Attention and concentration are normal.  Cranial nerves: CN I: not tested CN II: legally blind on right eye. Left pupil round. Visual fields intact on left CN III, IV, VI:  full range of motion, no nystagmus, no ptosis CN V: facial sensation intact CN VII: upper and lower face symmetric CN VIII: hearing intact to conversation Bulk & Tone: normal, no fasciculations. Motor: right arm weakness, 3/5 right shoulder abduction, finger extension, 5/5 on left UE, 3+/5 bilateral proximal LE, 4/5 bilateral distal LE Sensation: intact to light touch Deep Tendon Reflexes: unable to elicit on left, brisk +3 on right Cerebellar: ataxic on right, intact on left finger to nose testing Gait: not tested Tremor: none  IMPRESSION: This is an 85 year old right-handed man with a history of hypertension,  bradycardia, DVT on Xarelto, stroke in 2014 with residual right hemiparesis, presenting for evaluation of recurrent episodes of decreased responsiveness since around 2015. He has around 4-5 episode a year, most recently last 08/2019 with report of shaking, which is different from prior events. Head CT and routine EEG unremarkable. Etiology of episodes unclear, seizure is a consideration since episodes appear to have started after the stroke. There are plans for further cardiac testing if neurological workup is unrevealing. MRI brain without contrast and a 72-hour EEG will be ordered to assess for focal abnormalities that increase risk for recurrent seizures. If unremarkable, continue follow-up with Cardiology and follow-up prn in our office, they know to call for any changes. He does not drive.   Thank you for allowing me to participate in the care of this patient. Please do not hesitate to call for any questions or concerns.   Patrcia Dolly, M.D.  CC: Dr. Graciela Kent, Dr. [name]

## 2020-02-05 ENCOUNTER — Ambulatory Visit
Admission: RE | Admit: 2020-02-05 | Discharge: 2020-02-05 | Disposition: A | Discharge status: Home / Self Care | Payer: Medicare Other | Source: Ambulatory Visit | Attending: Neurology | Admitting: Neurology

## 2020-02-05 DIAGNOSIS — Z8673 Personal history of transient ischemic attack (TIA), and cerebral infarction without residual deficits: Secondary | ICD-10-CM

## 2020-02-05 DIAGNOSIS — R55 Syncope and collapse: Secondary | ICD-10-CM

## 2020-02-24 ENCOUNTER — Ambulatory Visit (INDEPENDENT_AMBULATORY_CARE_PROVIDER_SITE_OTHER): Payer: Medicare Other | Admitting: Neurology

## 2020-02-24 ENCOUNTER — Other Ambulatory Visit: Payer: Self-pay

## 2020-02-24 DIAGNOSIS — R55 Syncope and collapse: Secondary | ICD-10-CM | POA: Diagnosis not present

## 2020-02-24 DIAGNOSIS — Z8673 Personal history of transient ischemic attack (TIA), and cerebral infarction without residual deficits: Secondary | ICD-10-CM

## 2020-02-28 ENCOUNTER — Ambulatory Visit (INDEPENDENT_AMBULATORY_CARE_PROVIDER_SITE_OTHER): Payer: Medicare Other

## 2020-02-28 VITALS — BP 128/72 | HR 57 | Temp 98.3°F | Resp 20 | Ht 74.0 in | Wt 194.0 lb

## 2020-02-28 DIAGNOSIS — Z0000 Encounter for general adult medical examination without abnormal findings: Secondary | ICD-10-CM

## 2020-02-28 NOTE — Progress Notes (Signed)
Subjective:   Steven Kent is a 85 y.o. male who presents for an Initial Medicare Annual Wellness Visit.  Review of Systems      Cardiac Risk Factors include: hypertension;male gender;dyslipidemia;sedentary lifestyle     Objective:    Today's Vitals   02/28/20 1448  BP: 128/72  Pulse: 57  Resp: 20  Temp: 98.3 F (36.8 C)  SpO2: 97%  Weight: 194 lb (88 kg)  Height: 6\' 2"  (1.88 m)  PainSc: 0-No pain   Body mass index is 24.91 kg/m.  Advanced Directives 02/28/2020 02/04/2020 11/17/2019 11/17/2019 08/23/2019 08/23/2019 08/23/2019  Does Patient Have a Medical Advance Directive? No No No No No No No  Would patient like information on creating a medical advance directive? Yes (ED - Information included in AVS) - No - Patient declined No - Patient declined Yes (Inpatient - patient defers creating a medical advance directive and declines information at this time) Yes (Inpatient - patient defers creating a medical advance directive and declines information at this time) No - Patient declined    Current Medications (verified) Outpatient Encounter Medications as of 02/28/2020  Medication Sig  . atorvastatin (LIPITOR) 40 MG tablet TAKE 1 TABLET(40 MG) BY MOUTH EVERY EVENING  . brimonidine (ALPHAGAN) 0.15 % ophthalmic solution Place 1 drop into both eyes daily.  . dorzolamide (TRUSOPT) 2 % ophthalmic solution Place 1 drop into both eyes 2 (two) times daily.  03/01/2020 gabapentin (NEURONTIN) 400 MG capsule TAKE 1 CAPSULE(400 MG) BY MOUTH TWICE DAILY  . latanoprost (XALATAN) 0.005 % ophthalmic solution Place 1 drop into both eyes at bedtime.  Marland Kitchen oxybutynin (DITROPAN) 5 MG tablet Take 1 tablet (5 mg total) by mouth 2 (two) times daily.  Marland Kitchen torsemide (DEMADEX) 10 MG tablet Take 2 tablets (20 mg total) by mouth daily.  . Vitamin D, Ergocalciferol, (DRISDOL) 1.25 MG (50000 UNIT) CAPS capsule TAKE 1 CAPSULE BY MOUTH EVERY 7 DAYS  . XARELTO 20 MG TABS tablet TAKE 1 TABLET(20 MG) BY MOUTH EVERY EVENING    No facility-administered encounter medications on file as of 02/28/2020.    Allergies (verified) Aspirin   History: Past Medical History:  Diagnosis Date  . Abdominal aortic aneurysm (AAA) (HCC)   . Hypertension   . Stroke [facility])    Past Surgical History:  Procedure Laterality Date  . CATARACT EXTRACTION, BILATERAL     rt eye 10/09/2014 lt eye 11/06/2014  . DENTAL SURGERY    . REFRACTIVE SURGERY  01/2015   Family History  Problem Relation Age of Onset  . Prostate cancer Neg Hx    Social History   Socioeconomic History  . Marital status: Married    Spouse name: Not on file  . Number of children: Not on file  . Years of education: Not on file  . Highest education level: Not on file  Occupational History  . Occupation: Retired  Tobacco Use  . Smoking status: Former 03/2015  . Smokeless tobacco: Never Used  Substance and Sexual Activity  . Alcohol use: Never  . Drug use: Never  . Sexual activity: Not on file  Other Topics Concern  . 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 Difficulty of Paying Living Expenses: Not hard at all  Food  Insecurity: No Food Insecurity  . Worried About Programme researcher, broadcasting/film/videounning Out of Food in the Last Year: Never true  . Ran Out of Food in the Last Year: Never true  Transportation Needs: No Transportation Needs  . Lack of Transportation (Medical): No  . Lack of Transportation (Non-Medical): No  Physical Activity: Inactive  . Days of Exercise per Week: 0 days  . Minutes of Exercise per Session: 0 min  Stress: No Stress Concern Present  . Feeling of Stress : Not at all  Social Connections: Moderately Integrated  . Frequency of Communication with Friends and Family: More than three times a week  . Frequency of Social Gatherings with Friends and Family: Never  . Attends Religious  Services: More than 4 times per year  . Active Member of Clubs or Organizations: No  . Attends BankerClub or Organization Meetings: Never  . Marital Status: Married    Tobacco Counseling Counseling given: Not Answered   Clinical Intake:  Pre-visit preparation completed: Yes  Pain : No/denies pain Pain Score: 0-No pain     BMI - recorded: 24.91 Nutritional Status: BMI of 19-24  Normal Nutritional Risks: None Diabetes: No  How often do you need to have someone help you when you read instructions, pamphlets, or other written materials from your doctor or pharmacy?: 1 - Never  Diabetic?No  Interpreter Needed?: No  Information entered by :: Lanier Ensignina Taraann Olthoff, LPN   Activities of Daily Living In your present state of health, do you have any difficulty performing the following activities: 02/28/2020 08/23/2019  Hearing? Y Y  Comment mild hearing loss declines hearing aid or audiologist at this time -  Vision? N Y  Difficulty concentrating or making decisions? Y N  Comment sleepy at times -  Walking or climbing stairs? Malvin JohnsY Y  Comment uses a walker no stairs in home -  Dressing or bathing? Malvin JohnsY Y  Comment needs assistance to reach areas -  Doing errands, shopping? N -  Preparing Food and eating ? Y -  Comment wife prepares food -  Using the Toilet? Y -  Comment needs assistance -  Managing your Medications? N -  Comment wife assist -  Managing your Finances? N -  Housekeeping or managing your Housekeeping? N -  Comment wife assist -  Some recent data might be hidden    Patient Care Team: Ardith DarkParker, Caleb M, MD as PCP - General (Family Medicine) Nahser, Deloris PingPhilip J, MD as PCP - Cardiology (Cardiology) Van ClinesAquino, Karen M, MD as Consulting Physician (Neurology)  Indicate any recent Medical Services you may have received from other than [facility] providers in the past year (date may be approximate).     Assessment:   This is a routine wellness examination for Steven SalinaBenjamin.  Hearing/Vision  screen  Hearing Screening   125Hz  250Hz  500Hz  1000Hz  2000Hz  3000Hz  4000Hz  6000Hz  8000Hz   Right ear:           Left ear:           Comments: Mild hearing loss at times   Vision Screening Comments: Wears glasses and DX of glaucoma and he follows up with Dr Dione Boozegroat for exams  Dietary issues and exercise activities discussed: Current Exercise Habits: The patient does not participate in regular exercise at present, Exercise limited by: orthopedic condition(s);Other - see comments (pt stated he needs Physical therapy to help)  Goals    . Patient Stated     Stay healthy      Depression Screen PHQ 2/9 Scores 02/28/2020  02/12/2019  PHQ - 2 Score 0 0    Fall Risk Fall Risk  02/28/2020 02/04/2020 12/09/2019  Falls in the past year? 1 1 1   Number falls in past yr: 1 1 1   Comment 3 - 11/17/2019  Injury with Fall? 1 1 1   Comment one head injury and had to go to hospital - -  Risk for fall due to : History of fall(s);Impaired balance/gait;Impaired mobility;Impaired vision Impaired balance/gait;Impaired mobility -  Follow up Falls prevention discussed - -    Any stairs in or around the home? No  If so, are there any without handrails? No  Home free of loose throw rugs in walkways, pet beds, electrical cords, etc? Yes  Adequate lighting in your home to reduce risk of falls? Yes   ASSISTIVE DEVICES UTILIZED TO PREVENT FALLS:  Life alert? No  Use of a cane, walker or w/c? Yes  Grab bars in the bathroom? No  Shower chair or bench in shower? Yes  Elevated toilet seat or a handicapped toilet? Yes   TIMED UP AND GO:  Was the test performed? No .  Pt is in wheelchair  Cognitive Function:     6CIT Screen 02/28/2020  What Year? 4 points  What month? 3 points  What time? 3 points  Count back from 20 0 points  Months in reverse 4 points  Repeat phrase 10 points  Total Score 24    Immunizations  There is no immunization history on file for this patient.  TDAP status: Due, Education has  been provided regarding the importance of this vaccine. Advised may receive this vaccine at local pharmacy or Health Dept. Aware to provide a copy of the vaccination record if obtained from local pharmacy or Health Dept. Verbalized acceptance and understanding. Flu Vaccine status: Declined, Education has been provided regarding the importance of this vaccine but patient still declined. Advised may receive this vaccine at local pharmacy or Health Dept. Aware to provide a copy of the vaccination record if obtained from local pharmacy or Health Dept. Verbalized acceptance and understanding. Pneumococcal vaccine status: Declined,  Education has been provided regarding the importance of this vaccine but patient still declined. Advised may receive this vaccine at local pharmacy or Health Dept. Aware to provide a copy of the vaccination record if obtained from local pharmacy or Health Dept. Verbalized acceptance and understanding.  Covid-19 vaccine status: Completed vaccines  Qualifies for Shingles Vaccine? Yes   Zostavax completed No   Shingrix Completed?: No.    Education has been provided regarding the importance of this vaccine. Patient has been advised to call insurance company to determine out of pocket expense if they have not yet received this vaccine. Advised may also receive vaccine at local pharmacy or Health Dept. Verbalized acceptance and understanding.  Screening Tests Health Maintenance  Topic Date Due  . COVID-19 Vaccine (1) Never done  . TETANUS/TDAP  09/08/2020 (Originally 07/24/1954)  . PNA vac Low Risk Adult (1 of 2 - PCV13) 09/08/2020 (Originally 07/24/2002)  . INFLUENZA VACCINE  03/01/2020    Health Maintenance  Health Maintenance Due  Topic Date Due  . COVID-19 Vaccine (1) Never done    Colorectal cancer screening: No longer required.     Additional Screening:  Hepatitis C Screening: does not qualify;   Vision Screening: Recommended annual ophthalmology exams for  early detection of glaucoma and other disorders of the eye. Is the patient up to date with their annual eye exam?  Yes  Who  is the provider or what is the name of the office in which the patient attends annual eye exams? Dr Dione Booze  Dental Screening: Recommended annual dental exams for proper oral hygiene  Community Resource Referral / Chronic Care Management: CRR required this visit?  No   CCM required this visit?  Yes      Plan:     I have personally reviewed and noted the following in the patient's chart:   . Medical and social history . Use of alcohol, tobacco or illicit drugs  . Current medications and supplements . Functional ability and status . Nutritional status . Physical activity . Advanced directives . List of other physicians . Hospitalizations, surgeries, and ER visits in previous 12 months . Vitals . Screenings to include cognitive, depression, and falls . Referrals and appointments  In addition, I have reviewed and discussed with patient certain preventive protocols, quality metrics, and best practice recommendations. A written personalized care plan for preventive services as well as general preventive health recommendations were provided to patient.     Marzella Schlein, LPN   09/12/9415   Nurse Notes: None

## 2020-02-28 NOTE — Addendum Note (Signed)
Addended by: Ardith Dark on: 02/28/2020 03:59 PM   Modules accepted: Level of Service

## 2020-02-28 NOTE — Progress Notes (Signed)
I have personally reviewed the Medicare Annual Wellness Visit and agree with the documentation. This is a subsequent AWV.  Katina Degree. [name], MD 02/28/2020 3:59 PM

## 2020-02-28 NOTE — Patient Instructions (Addendum)
Steven Kent , Thank you for taking time to come for your Medicare Wellness Visit. I appreciate your ongoing commitment to your health goals. Please review the following plan we discussed and let me know if I can assist you in the future.   Screening recommendations/referrals: Colonoscopy: Pt stated he had one 2 years ago Recommended yearly ophthalmology/optometry visit for glaucoma screening and checkup Recommended yearly dental visit for hygiene and checkup  Vaccinations: Influenza vaccine: Declines Pneumococcal vaccine: Declines Tdap vaccine: Due information given Shingles vaccine: Keep up the great work!   Covid-19: Call me back with date for vaccination  Advanced directives: Advance directive discussed with you today. I have provided a copy for you to complete at home and have notarized. Once this is complete please bring a copy in to our office so we can scan it into your chart.  Conditions/risks identified: Stay healthy  Next appointment: Follow up in one year for your annual wellness visit.   Preventive Care 72 Years and Older, Male Preventive care refers to lifestyle choices and visits with your health care provider that can promote health and wellness. What does preventive care include?  A yearly physical exam. This is also called an annual well check.  Dental exams once or twice a year.  Routine eye exams. Ask your health care provider how often you should have your eyes checked.  Personal lifestyle choices, including:  Daily care of your teeth and gums.  Regular physical activity.  Eating a healthy diet.  Avoiding tobacco and drug use.  Limiting alcohol use.  Practicing safe sex.  Taking low doses of aspirin every day.  Taking vitamin and mineral supplements as recommended by your health care provider. What happens during an annual well check? The services and screenings done by your health care provider during your annual well check will depend on your  age, overall health, lifestyle risk factors, and family history of disease. Counseling  Your health care provider may ask you questions about your:  Alcohol use.  Tobacco use.  Drug use.  Emotional well-being.  Home and relationship well-being.  Sexual activity.  Eating habits.  History of falls.  Memory and ability to understand (cognition).  Work and work Astronomer. Screening  You may have the following tests or measurements:  Height, weight, and BMI.  Blood pressure.  Lipid and cholesterol levels. These may be checked every 5 years, or more frequently if you are over 70 years old.  Skin check.  Lung cancer screening. You may have this screening every year starting at age 51 if you have a 30-pack-year history of smoking and currently smoke or have quit within the past 15 years.  Fecal occult blood test (FOBT) of the stool. You may have this test every year starting at age 32.  Flexible sigmoidoscopy or colonoscopy. You may have a sigmoidoscopy every 5 years or a colonoscopy every 10 years starting at age 61.  Prostate cancer screening. Recommendations will vary depending on your family history and other risks.  Hepatitis C blood test.  Hepatitis B blood test.  Sexually transmitted disease (STD) testing.  Diabetes screening. This is done by checking your blood sugar (glucose) after you have not eaten for a while (fasting). You may have this done every 1-3 years.  Abdominal aortic aneurysm (AAA) screening. You may need this if you are a current or former smoker.  Osteoporosis. You may be screened starting at age 44 if you are at high risk. Talk with your health care  provider about your test results, treatment options, and if necessary, the need for more tests. Vaccines  Your health care provider may recommend certain vaccines, such as:  Influenza vaccine. This is recommended every year.  Tetanus, diphtheria, and acellular pertussis (Tdap, Td) vaccine. You  may need a Td booster every 10 years.  Zoster vaccine. You may need this after age 32.  Pneumococcal 13-valent conjugate (PCV13) vaccine. One dose is recommended after age 55.  Pneumococcal polysaccharide (PPSV23) vaccine. One dose is recommended after age 96. Talk to your health care provider about which screenings and vaccines you need and how often you need them. This information is not intended to replace advice given to you by your health care provider. Make sure you discuss any questions you have with your health care provider. Document Released: 08/14/2015 Document Revised: 04/06/2016 Document Reviewed: 05/19/2015 Elsevier Interactive Patient Education  2017 [place] Prevention in the Home Falls can cause injuries. They can happen to people of all ages. There are many things you can do to make your home safe and to help prevent falls. What can I do on the outside of my home?  Regularly fix the edges of walkways and driveways and fix any cracks.  Remove anything that might make you trip as you walk through a door, such as a raised step or threshold.  Trim any bushes or trees on the path to your home.  Use bright outdoor lighting.  Clear any walking paths of anything that might make someone trip, such as rocks or tools.  Regularly check to see if handrails are loose or broken. Make sure that both sides of any steps have handrails.  Any raised decks and porches should have guardrails on the edges.  Have any leaves, snow, or ice cleared regularly.  Use sand or salt on walking paths during winter.  Clean up any spills in your garage right away. This includes oil or grease spills. What can I do in the bathroom?  Use night lights.  Install grab bars by the toilet and in the tub and shower. Do not use towel bars as grab bars.  Use non-skid mats or decals in the tub or shower.  If you need to sit down in the shower, use a plastic, non-slip stool.  Keep the floor  dry. Clean up any water that spills on the floor as soon as it happens.  Remove soap buildup in the tub or shower regularly.  Attach bath mats securely with double-sided non-slip rug tape.  Do not have throw rugs and other things on the floor that can make you trip. What can I do in the bedroom?  Use night lights.  Make sure that you have a light by your bed that is easy to reach.  Do not use any sheets or blankets that are too big for your bed. They should not hang down onto the floor.  Have a firm chair that has side arms. You can use this for support while you get dressed.  Do not have throw rugs and other things on the floor that can make you trip. What can I do in the kitchen?  Clean up any spills right away.  Avoid walking on wet floors.  Keep items that you use a lot in easy-to-reach places.  If you need to reach something above you, use a strong step stool that has a grab bar.  Keep electrical cords out of the way.  Do not use floor polish  or wax that makes floors slippery. If you must use wax, use non-skid floor wax.  Do not have throw rugs and other things on the floor that can make you trip. What can I do with my stairs?  Do not leave any items on the stairs.  Make sure that there are handrails on both sides of the stairs and use them. Fix handrails that are broken or loose. Make sure that handrails are as long as the stairways.  Check any carpeting to make sure that it is firmly attached to the stairs. Fix any carpet that is loose or worn.  Avoid having throw rugs at the top or bottom of the stairs. If you do have throw rugs, attach them to the floor with carpet tape.  Make sure that you have a light switch at the top of the stairs and the bottom of the stairs. If you do not have them, ask someone to add them for you. What else can I do to help prevent falls?  Wear shoes that:  Do not have high heels.  Have rubber bottoms.  Are comfortable and fit you  well.  Are closed at the toe. Do not wear sandals.  If you use a stepladder:  Make sure that it is fully opened. Do not climb a closed stepladder.  Make sure that both sides of the stepladder are locked into place.  Ask someone to hold it for you, if possible.  Clearly mark and make sure that you can see:  Any grab bars or handrails.  First and last steps.  Where the edge of each step is.  Use tools that help you move around (mobility aids) if they are needed. These include:  Canes.  Walkers.  Scooters.  Crutches.  Turn on the lights when you go into a dark area. Replace any light bulbs as soon as they burn out.  Set up your furniture so you have a clear path. Avoid moving your furniture around.  If any of your floors are uneven, fix them.  If there are any pets around you, be aware of where they are.  Review your medicines with your doctor. Some medicines can make you feel dizzy. This can increase your chance of falling. Ask your doctor what other things that you can do to help prevent falls. This information is not intended to replace advice given to you by your health care provider. Make sure you discuss any questions you have with your health care provider. Document Released: 05/14/2009 Document Revised: 12/24/2015 Document Reviewed: 08/22/2014 Elsevier Interactive Patient Education  2017 [employer].

## 2020-03-04 ENCOUNTER — Encounter: Payer: Self-pay | Admitting: Podiatry

## 2020-03-04 ENCOUNTER — Ambulatory Visit (INDEPENDENT_AMBULATORY_CARE_PROVIDER_SITE_OTHER): Payer: Medicare Other | Admitting: Podiatry

## 2020-03-04 ENCOUNTER — Telehealth: Payer: Self-pay | Admitting: *Deleted

## 2020-03-04 ENCOUNTER — Other Ambulatory Visit: Payer: Self-pay

## 2020-03-04 DIAGNOSIS — R600 Localized edema: Secondary | ICD-10-CM

## 2020-03-04 DIAGNOSIS — D689 Coagulation defect, unspecified: Secondary | ICD-10-CM | POA: Diagnosis not present

## 2020-03-04 DIAGNOSIS — M79674 Pain in right toe(s): Secondary | ICD-10-CM

## 2020-03-04 DIAGNOSIS — M79675 Pain in left toe(s): Secondary | ICD-10-CM

## 2020-03-04 DIAGNOSIS — B351 Tinea unguium: Secondary | ICD-10-CM | POA: Diagnosis not present

## 2020-03-04 DIAGNOSIS — R609 Edema, unspecified: Secondary | ICD-10-CM

## 2020-03-04 MED ORDER — DOXYCYCLINE HYCLATE 100 MG PO CAPS: 100.0000 mg | ORAL_CAPSULE | Freq: Two times a day (BID) | ORAL | 0 refills | Status: DC

## 2020-03-04 NOTE — Telephone Encounter (Signed)
Faxed required form, clinicals and demographics to [facility]. Dr. Stacie Acres states Dr. Ardelle Anton would like pt to be on Doxycycline 100mg  capsules bid #20.

## 2020-03-04 NOTE — Progress Notes (Signed)
This patient returns to my office for at risk foot care.  This patient requires this care by a professional since this patient will be at risk due to having kidney disease,  CVA and neuropathy.  This patient is unable to cut nails himself since the patient cannot reach his nails.These nails are painful walking and wearing shoes.  This patient presents for at risk foot care today.  General Appearance  Alert, conversant and in no acute stress.  Vascular  Dorsalis pedis and posterior tibial  pulses are not   palpable  Both feet due to swelling...  Capillary return is within normal limits  bilaterally. Temperature is increased right leg/ foot.  Neurologic  Deferred . Muscle power within normal limits bilaterally.  Nails Thick disfigured discolored nails with subungual debris  from hallux to fifth toes bilaterally. No evidence of bacterial infection or drainage bilaterally.  Orthopedic  No limitations of motion  feet .  No crepitus or effusions noted.  No bony pathology or digital deformities noted.  Skin  normotropic skin with no porokeratosis noted bilaterally.  No signs of infections or ulcers noted.   There is severe swelling with increased temperature right foot/legs.  Onychomycosis  Pain in right toes  Pain in left toes  Swelling right leg/foot.  Consent was obtained for treatment procedures.   Mechanical debridement of nails 1-5  bilaterally performed with a nail nipper.  Filed with dremel without incident.  During debridement of his nails it was noted that he had severely  swollen right foot with an severely swollen right leg.  Patient states that he has been experiencing throbbing pain noted in his right leg and foot.  Talked with this patient who states he had a stroke on his right side.  He also had a history of venous thrombosis on his right leg.  Patient is presently taking Xarelto tablets.  I reviewed my previous notes and it did not mention any swelling or problems with his right leg/foot.   At this time I asked Dr. Ardelle Anton to evaluate this patient's condition.  Dr. Ardelle Anton suggested that we treat him with doxycycline for possible infection and to order a venous doppler  on his right leg r/o clotting.Talked with Vikki Ports and Vikki Ports is going to make the arrangements for the venous doppler  as well as call in the prescription for doxycycline for this patient.  Patient will be contacted once the results of the tests are received.     Return office visit   3 months                    Told patient to return for periodic foot care and evaluation due to potential at risk complications.   Helane Gunther DPM

## 2020-03-05 ENCOUNTER — Ambulatory Visit (HOSPITAL_COMMUNITY)
Admission: RE | Admit: 2020-03-05 | Discharge: 2020-03-05 | Disposition: A | Discharge status: Home / Self Care | Payer: Medicare Other | Source: Ambulatory Visit | Attending: Podiatry | Admitting: Podiatry

## 2020-03-05 ENCOUNTER — Other Ambulatory Visit: Payer: Self-pay

## 2020-03-05 ENCOUNTER — Telehealth: Payer: Self-pay | Admitting: *Deleted

## 2020-03-05 DIAGNOSIS — D689 Coagulation defect, unspecified: Secondary | ICD-10-CM | POA: Diagnosis not present

## 2020-03-05 DIAGNOSIS — R609 Edema, unspecified: Secondary | ICD-10-CM | POA: Diagnosis not present

## 2020-03-05 NOTE — Telephone Encounter (Signed)
[facility] - Steven Kent states pt has limited mobility testing shows no DVT in patella to femoral, superficial can be compressed, calves veins not visible.

## 2020-03-06 ENCOUNTER — Telehealth: Payer: Self-pay | Admitting: *Deleted

## 2020-03-06 NOTE — Telephone Encounter (Signed)
Dr. Stacie Acres states he reviewed the results of the venous doppler and to contact pt and inform that the test results were as normal for what they were able to visualize. I informed pt's wife, [name] of Dr. Aram Beecham review of results.

## 2020-03-11 NOTE — Procedures (Signed)
ELECTROENCEPHALOGRAM REPORT  Dates of Recording: 02/24/2020 11:25AM to 02/27/2020 12:18PM  Patient's Name: Steven Kent MRN: [id number] Date of Birth: 05-Sep-1934  Referring Provider: Dr. Patrcia Dolly  Procedure: 72-hour ambulatory video EEG  History: This is an 85 year old man with recurrent episodes of decreased responsiveness, most recently with report of shaking.   Medications:  DRISDOL 1.25 MG (50000 UNIT) CAPS LIPITOR 40 MG tablet ALPHAGAN 0.15 % ophthalmic solution TRUSOPT 2 % ophthalmic solution NEURONTIN 400 MG capsule XALATAN 0.005 % ophthalmic solution DITROPAN 5 MG tablet DEMADEX 10 MG tablet XARELTO 20 MG TABS tablet  Technical Summary: This is a 72-hour multichannel digital video EEG recording measured by the international 10-20 system with electrodes applied with paste and impedances below 5000 ohms performed as portable with EKG monitoring.  The digital EEG was referentially recorded, reformatted, and digitally filtered in a variety of bipolar and referential montages for optimal display.    DESCRIPTION OF RECORDING: During maximal wakefulness, the background activity consisted of a symmetric 9 Hz posterior dominant rhythm which was reactive to eye opening.  There were no epileptiform discharges or focal slowing seen in wakefulness.  During the recording, the patient progresses through wakefulness, drowsiness, and Stage 2 sleep.  Again, there were no epileptiform discharges seen.  Events: There were no push button events.  There were no electrographic seizures seen.  EKG lead was abnormal with periods of bradycardia to 48bpm, irregular rhythm, frequent extrasystolic beats.   IMPRESSION: This 72-hour ambulatory video EEG study is normal.    CLINICAL CORRELATION: A normal EEG does not exclude a clinical diagnosis of epilepsy. Typical events were not captured. Note of abnormal 1-lead EKG.     Patrcia Dolly, M.D.

## 2020-03-12 ENCOUNTER — Telehealth: Payer: Self-pay

## 2020-03-12 NOTE — Telephone Encounter (Signed)
[facility] Radiology to request MRI imaging results performed on 02/05/2020. Brandy returned call and left a message stating that we need to fax a request form to [phone].   Called Radiology back 03/12/2020 and left a message for Gearldine Bienenstock to return my call to see if we actually need to send a request form due to the fact that Dr. Karel Jarvis was the ordering physician.

## 2020-03-13 NOTE — Telephone Encounter (Signed)
Called and spoke to Casa who said she will f/u with billing to see what she can find out. She will get back to me once she has answers.

## 2020-03-17 ENCOUNTER — Telehealth: Payer: Self-pay | Admitting: Family Medicine

## 2020-03-18 ENCOUNTER — Telehealth: Payer: Self-pay | Admitting: Neurology

## 2020-03-18 ENCOUNTER — Emergency Department (HOSPITAL_COMMUNITY)
Admission: EM | Admit: 2020-03-18 | Discharge: 2020-03-18 | Disposition: A | Discharge status: Home / Self Care | Payer: Medicare Other | Attending: Emergency Medicine | Admitting: Emergency Medicine

## 2020-03-18 ENCOUNTER — Other Ambulatory Visit: Payer: Self-pay

## 2020-03-18 ENCOUNTER — Ambulatory Visit
Admission: EM | Admit: 2020-03-18 | Discharge: 2020-03-18 | Disposition: A | Discharge status: Home / Self Care | Payer: Medicare Other

## 2020-03-18 ENCOUNTER — Encounter: Payer: Self-pay | Admitting: Emergency Medicine

## 2020-03-18 DIAGNOSIS — L89899 Pressure ulcer of other site, unspecified stage: Secondary | ICD-10-CM | POA: Diagnosis not present

## 2020-03-18 DIAGNOSIS — Y999 Unspecified external cause status: Secondary | ICD-10-CM | POA: Insufficient documentation

## 2020-03-18 DIAGNOSIS — Y929 Unspecified place or not applicable: Secondary | ICD-10-CM | POA: Diagnosis not present

## 2020-03-18 DIAGNOSIS — Z5321 Procedure and treatment not carried out due to patient leaving prior to being seen by health care provider: Secondary | ICD-10-CM | POA: Insufficient documentation

## 2020-03-18 DIAGNOSIS — X58XXXA Exposure to other specified factors, initial encounter: Secondary | ICD-10-CM | POA: Diagnosis not present

## 2020-03-18 DIAGNOSIS — M7989 Other specified soft tissue disorders: Secondary | ICD-10-CM

## 2020-03-18 DIAGNOSIS — Y939 Activity, unspecified: Secondary | ICD-10-CM | POA: Diagnosis not present

## 2020-03-18 DIAGNOSIS — S81801A Unspecified open wound, right lower leg, initial encounter: Secondary | ICD-10-CM | POA: Diagnosis not present

## 2020-03-18 LAB — COMPREHENSIVE METABOLIC PANEL
ALT: 16 U/L (ref 0–44)
AST: 34 U/L (ref 15–41)
Albumin: 3.3 g/dL — ABNORMAL LOW (ref 3.5–5.0)
Alkaline Phosphatase: 48 U/L (ref 38–126)
Anion gap: 12 (ref 5–15)
BUN: 16 mg/dL (ref 8–23)
CO2: 27 mmol/L (ref 22–32)
Calcium: 8.9 mg/dL (ref 8.9–10.3)
Chloride: 101 mmol/L (ref 98–111)
Creatinine, Ser: 1.54 mg/dL — ABNORMAL HIGH (ref 0.61–1.24)
GFR calc Af Amer: 48 mL/min — ABNORMAL LOW (ref >60)
GFR calc non Af Amer: 41 mL/min — ABNORMAL LOW (ref >60)
Glucose, Bld: 92 mg/dL (ref 70–99)
Potassium: 4 mmol/L (ref 3.5–5.1)
Sodium: 140 mmol/L (ref 135–145)
Total Bilirubin: 1.8 mg/dL — ABNORMAL HIGH (ref 0.3–1.2)
Total Protein: 6.9 g/dL (ref 6.5–8.1)

## 2020-03-18 LAB — CBC WITH DIFFERENTIAL/PLATELET
Abs Immature Granulocytes: 0.01 10*3/uL (ref 0.00–0.07)
Basophils Absolute: 0 10*3/uL (ref 0.0–0.1)
Basophils Relative: 0 %
Eosinophils Absolute: 0.1 10*3/uL (ref 0.0–0.5)
Eosinophils Relative: 2 %
HCT: 39.9 % (ref 39.0–52.0)
Hemoglobin: 12.8 g/dL — ABNORMAL LOW (ref 13.0–17.0)
Immature Granulocytes: 0 %
Lymphocytes Relative: 34 %
Lymphs Abs: 1.9 10*3/uL (ref 0.7–4.0)
MCH: 31.7 pg (ref 26.0–34.0)
MCHC: 32.1 g/dL (ref 30.0–36.0)
MCV: 98.8 fL (ref 80.0–100.0)
Monocytes Absolute: 0.6 10*3/uL (ref 0.1–1.0)
Monocytes Relative: 11 %
Neutro Abs: 2.9 10*3/uL (ref 1.7–7.7)
Neutrophils Relative %: 53 %
Platelets: 133 10*3/uL — ABNORMAL LOW (ref 150–400)
RBC: 4.04 MIL/uL — ABNORMAL LOW (ref 4.22–5.81)
RDW: 14.5 % (ref 11.5–15.5)
WBC: 5.6 10*3/uL (ref 4.0–10.5)
nRBC: 0 % (ref 0.0–0.2)

## 2020-03-18 LAB — LACTIC ACID, PLASMA: Lactic Acid, Venous: 1.2 mmol/L (ref 0.5–1.9)

## 2020-03-18 NOTE — Telephone Encounter (Signed)
[facility] Radiology called in wanting to speak with someone to give a call report for an MRI head for this patient. She stated anyone who answers can give that report when they are called back

## 2020-03-18 NOTE — ED Provider Notes (Signed)
[facility]    CSN: [id number] Arrival date & time: 03/18/20  1323      History   Chief Complaint Chief Complaint  Patient presents with  . Wound Check  . Leg Swelling    HPI Steven Kent is a 85 y.o. male history of right-sided hemiplegia s/p stroke, hypertension presenting with his wife for worsening right leg pain and swelling.  Has been followed by PCP for this over the last few weeks: Given antibiotics which he completed.  Underwent DVT ultrasound study: Negative, though unable to compress calf veins.  Reports compliance with Xarelto.  Does note wound to the back of calf which they have been changing dressings daily.  No fever, chest pain, shortness of breath.   Past Medical History:  Diagnosis Date  . Abdominal aortic aneurysm (AAA) (HCC)   . Hypertension   . Stroke [facility])     Patient Active Problem List   Diagnosis Date Noted  . Coagulation disorder (HCC) 03/04/2020  . Syncope and collapse 10/10/2019  . Bradycardia 08/23/2019  . Essential hypertension 08/16/2018  . Hypertensive kidney disease with chronic kidney disease stage III 08/16/2018  . VTE (venous thromboembolism) 08/16/2018  . Peripheral neuropathy 08/16/2018  . Glaucoma 08/16/2018  . Onychomycosis 08/16/2018  . Leg edema 08/16/2018  . Hyperlipidemia 08/16/2018  . Overactive bladder 08/16/2018  . Coronary artery disease involving native heart without angina pectoris 08/16/2018  . Hemiparesis affecting right side as late effect of cerebrovascular accident (HCC)   . Abdominal aortic aneurysm (AAA) [facility])     Past Surgical History:  Procedure Laterality Date  . CATARACT EXTRACTION, BILATERAL     rt eye 10/09/2014 lt eye 11/06/2014  . DENTAL SURGERY    . REFRACTIVE SURGERY  01/2015       Home Medications    Prior to Admission medications   Medication Sig Start Date End Date Taking? Authorizing Provider  atorvastatin (LIPITOR) 40 MG tablet TAKE 1 TABLET(40 MG) BY MOUTH EVERY  EVENING 08/26/19   Ardith Dark, MD  brimonidine (ALPHAGAN) 0.15 % ophthalmic solution Place 1 drop into both eyes daily.    [provider]  dorzolamide (TRUSOPT) 2 % ophthalmic solution Place 1 drop into both eyes 2 (two) times daily.    [provider]  doxycycline (VIBRAMYCIN) 100 MG capsule Take 1 capsule (100 mg total) by mouth 2 (two) times daily. Patient not taking: Reported on 03/18/2020 03/04/20   Vivi Barrack, DPM  gabapentin (NEURONTIN) 400 MG capsule TAKE 1 CAPSULE(400 MG) BY MOUTH TWICE DAILY 09/09/19   Ardith Dark, MD  latanoprost (XALATAN) 0.005 % ophthalmic solution Place 1 drop into both eyes at bedtime.    [provider]  oxybutynin (DITROPAN) 5 MG tablet Take 1 tablet (5 mg total) by mouth 2 (two) times daily. 11/05/19   Ardith Dark, MD  torsemide (DEMADEX) 10 MG tablet Take 2 tablets (20 mg total) by mouth daily. 10/28/19   Duke Salvia, MD  Vitamin D, Ergocalciferol, (DRISDOL) 1.25 MG (50000 UNIT) CAPS capsule TAKE 1 CAPSULE BY MOUTH EVERY 7 DAYS 01/16/20   Ardith Dark, MD  XARELTO 20 MG TABS tablet TAKE 1 TABLET(20 MG) BY MOUTH EVERY EVENING 08/26/19   Ardith Dark, MD    Family History Family History  Problem Relation Age of Onset  . Prostate cancer Neg Hx     Social History Social History   Tobacco Use  . Smoking status: Former Games developer  . Smokeless  tobacco: Never Used  Substance Use Topics  . Alcohol use: Never  . Drug use: Never     Allergies   Aspirin   Review of Systems As per HPI   Physical Exam Triage Vital Signs ED Triage Vitals [03/18/20 1432]  Enc Vitals Group     BP 120/69     Pulse Rate 62     Resp 18     Temp 98.2 F (36.8 C)     Temp Source Oral     SpO2 96 %     Weight      Height      Head Circumference      Peak Flow      Pain Score 4     Pain Loc      Pain Edu?      Excl. in GC?    No data found.  Updated Vital Signs BP 120/69 (BP Location: Right Arm)   Pulse 62   Temp  98.2 F (36.8 C) (Oral)   Resp 18   SpO2 96%   Visual Acuity Right Eye Distance:   Left Eye Distance:   Bilateral Distance:    Right Eye Near:   Left Eye Near:    Bilateral Near:     Physical Exam Constitutional:      General: He is not in acute distress. HENT:     Head: Normocephalic and atraumatic.  Eyes:     General: No scleral icterus.    Pupils: Pupils are equal, round, and reactive to light.  Cardiovascular:     Rate and Rhythm: Normal rate.  Pulmonary:     Effort: Pulmonary effort is normal. No respiratory distress.     Breath sounds: No wheezing.  Musculoskeletal:        General: Swelling and tenderness present.     Comments: Gross pitting edema to knee of right foot with dry, cracking skin.  Sensation intact  Skin:    Coloration: Skin is not jaundiced or pale.     Comments: 5 cm superficial ulceration noted.  Good granulation tissue without purulence or malodor, erythema, warmth or streaking.  Neurological:     Mental Status: He is alert and oriented to person, place, and time.      UC Treatments / Results  Labs (all labs ordered are listed, but only abnormal results are displayed) Labs Reviewed - No data to display  EKG   Radiology No results found.  Procedures Procedures (including critical care time)  Medications Ordered in UC Medications - No data to display  Initial Impression / Assessment and Plan / UC Course  I have reviewed the triage vital signs and the nursing notes.  Pertinent labs & imaging results that were available during my care of the patient were reviewed by me and considered in my medical decision making (see chart for details).     Patient febrile, nontoxic, though with numerous comorbidities and overall poor health status.  Discussed limited work-up in UC setting given extensive work-up and treatment so far with PCP.  Discussed further care with PCP, wound specialist.  Wife and patient electing to go to ER for further  evaluation/management as they have a cruise this Saturday.  Electing to transport in stable condition. Final Clinical Impressions(s) / UC Diagnoses   Final diagnoses:  Right leg swelling  Pressure injury of skin of right calf, unspecified injury stage   Discharge Instructions   None    ED Prescriptions    None  PDMP not reviewed this encounter.   Hall-Potvin, [place], [place] 03/19/20 [phone]

## 2020-03-18 NOTE — Telephone Encounter (Signed)
[facility] called to make sure Dr Karel Jarvis knows MRI report has been entered ;  IMPRESSION: No evidence of acute intracranial abnormality, including acute infarction.  Chronic hemorrhagic left thalamic infarct.  Additional chronic lacunar infarcts within the basal ganglia and cerebellar hemispheres bilaterally. Background advanced chronic small vessel ischemic disease.  Supratentorial and infratentorial chronic microhemorrhages which may reflect sequela of hypertensive microangiopathy. A component of cerebral amyloid angiopathy cannot be excluded.  Mild generalized parenchymal atrophy.  Abnormal appearance of the right ocular globe which may reflect retinal or choroidal detachment.  Mild ethmoid and right maxillary sinus mucosal thickening.   Electronically Signed   By: Jackey Loge DO   On: 03/17/2020 16:50

## 2020-03-18 NOTE — Telephone Encounter (Signed)
Called pts wife and let her know that "the brain wave test was normal, no seizure activity seen. His MRI brain did not show any tumor or new stroke. It showed old changes, old stroke and hardening of small blood vessels seen in patients with blood pressure, cholesterol, and sugar issues. Pls let them know at this point would proceed with Cardiology testing planned" per Dr Karel Jarvis. She verbalized understanding. Told her that pt should f/u with cardiology and f/u with Dr Karel Jarvis as needed.

## 2020-03-18 NOTE — Telephone Encounter (Signed)
Thanks. Pls let his wife know that the brain wave test was normal, no seizure activity seen. His MRI brain did not show any tumor or new stroke. It showed old changes, old stroke and hardening of small blood vessels seen in patients with blood pressure, cholesterol, and sugar issues. Pls let them know at this point would proceed with Cardiology testing planned. Thanks

## 2020-03-18 NOTE — ED Notes (Signed)
Pt's wife  Informed staff that his husband would like to leave.

## 2020-03-18 NOTE — ED Triage Notes (Signed)
Pt here for worsening right leg swelling and wound noted x 1 week; pt has seen PCP given antibiotics without improvement

## 2020-03-18 NOTE — ED Triage Notes (Signed)
Two weeks ago pt was put on abx for warmth/pain/possible infection in R leg. Finished that course. Today, pt's wife noticed a sore on pt's calf that has been there for some time. Oozing noted on pt's sheets. Arrives with wound bandaged.

## 2020-03-19 ENCOUNTER — Encounter: Payer: Self-pay | Admitting: Emergency Medicine

## 2020-03-31 ENCOUNTER — Encounter: Payer: Self-pay | Admitting: Internal Medicine

## 2020-04-01 ENCOUNTER — Telehealth: Payer: Self-pay | Admitting: Family Medicine

## 2020-04-01 NOTE — Telephone Encounter (Signed)
Please schedule virtual appointment. Thanks

## 2020-04-01 NOTE — Progress Notes (Signed)
Patient Care Team: Ardith Dark, MD as PCP - General (Family Medicine) Nahser, Deloris Ping, MD as PCP - Cardiology (Cardiology) Van Clines, MD as Consulting Physician (Neurology)   HPI  Steven Kent is a 85 y.o. male Seen in followup for spells, the cause of which is unclear. 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  The spells, in his wifes mind, are distinct from sleep n that in the latter, he responds to voice and shaking.   No interval spells over the last 6 months but there have been long interludes    Records and Results Reviewed   Past Medical History:  Diagnosis Date  . Abdominal aortic aneurysm (AAA) (HCC)   . Hypertension   . Stroke [facility])     Past Surgical History:  Procedure Laterality Date  . CATARACT EXTRACTION, BILATERAL     rt eye 10/09/2014 lt eye 11/06/2014  . DENTAL SURGERY    . REFRACTIVE SURGERY  01/2015    Current Meds  Medication Sig  . amLODipine (NORVASC) 2.5 MG tablet Take 2.5 mg by mouth daily.  Marland Kitchen atorvastatin (LIPITOR) 40 MG tablet TAKE 1 TABLET(40 MG) BY MOUTH EVERY EVENING  . brimonidine (ALPHAGAN) 0.15 % ophthalmic solution Place 1 drop into both eyes daily.  . cephALEXin (KEFLEX) 500 MG capsule Take 500 mg by mouth every 6 (six) hours.  . dorzolamide (TRUSOPT) 2 % ophthalmic solution Place 1 drop into both eyes 2 (two) times daily.  Marland Kitchen gabapentin (NEURONTIN) 400 MG capsule TAKE 1 CAPSULE(400 MG) BY MOUTH TWICE DAILY  . latanoprost (XALATAN) 0.005 % ophthalmic solution Place 1 drop into both eyes at bedtime.  Marland Kitchen oxybutynin (DITROPAN) 5 MG tablet Take 1 tablet (5  mg total) by mouth 2 (two) times daily.  Marland Kitchen torsemide (DEMADEX) 10 MG tablet Take 2 tablets (20 mg total) by mouth daily.  . Vitamin D, Ergocalciferol, (DRISDOL) 1.25 MG (50000 UNIT) CAPS capsule TAKE 1 CAPSULE BY MOUTH EVERY 7 DAYS  . XARELTO 20 MG TABS tablet TAKE 1 TABLET(20 MG) BY MOUTH EVERY EVENING    Allergies  Allergen Reactions  . Aspirin Other (See Comments)    Full Strength Asprin-325 mg causes upset stomach. Can take 81 mg.      Review of Systems negative except from HPI and PMH  Physical Exam BP 128/70   Pulse (!) 52   Ht 6' 1.5" (1.867 m)   Wt 179 lb (81.2 kg)   SpO2 99%   BMI 23.30 kg/m  Well developed and well nourished in no acute distress HENT normal E scleral and icterus clear Neck Supple JVP flat; carotids brisk and full Clear to ausculation Regular rate and rhythm, no murmurs gallops or rub Soft with active bowel sounds No clubbing cyanosis  Edema Alert and oriented, grossly normal motor and sensory function Skin Warm and Dry  ECG sinus @ 49 27/10/50  sinus @ 57  28/10/42  08/26/2019 sinus rhythm 40 Intervals 30/08/40  08/23/2019 sinus at 47 Intervals 36/10/49  08/01/2018 sinus rhythm at 71 Intervals 30/10/44   Estimated Creatinine Clearance: 42.4 mL/min (A) (by C-G formula based on  SCr of 1.54 mg/dL (H)).   Assessment and  Plan  Stroke w residual HP  Spells  Bradycardia  VTE  Hypertension    I am not sure if these spells are cardiovascular and appreciate the input of neuro..  Cardiovascular syncope is associated with open eyes, not closed.  Latter is often a sign of new seizure, but in this case I wonder whether it is a sleep issue/narcolepsy.  Still to exclude a rhythm issue, esp given his bradycardia have decided to proceed with LINQinsertion Lengthy discussion re role of monitoring and reviewing the history    Current medicines are reviewed at length with the patient today .  The patient does not   have concerns  regarding medicines.

## 2020-04-01 NOTE — Telephone Encounter (Signed)
Patient is scheduled   

## 2020-04-01 NOTE — Telephone Encounter (Signed)
Patient's wife called in this morning to request an appointment for an ED follow up, states he has just had a lot going on and was told to follow up with PCP as soon as they got back home. Wanted to know if I can take a virtual slot next Friday 04/10/20 at 4?

## 2020-04-03 ENCOUNTER — Other Ambulatory Visit: Payer: Self-pay

## 2020-04-03 ENCOUNTER — Encounter: Payer: Self-pay | Admitting: Internal Medicine

## 2020-04-03 ENCOUNTER — Ambulatory Visit (INDEPENDENT_AMBULATORY_CARE_PROVIDER_SITE_OTHER): Payer: Medicare Other | Admitting: Internal Medicine

## 2020-04-03 VITALS — BP 128/70 | HR 52 | Ht 73.5 in | Wt 179.0 lb

## 2020-04-03 DIAGNOSIS — I8290 Acute embolism and thrombosis of unspecified vein: Secondary | ICD-10-CM

## 2020-04-03 DIAGNOSIS — Z79899 Other long term (current) drug therapy: Secondary | ICD-10-CM | POA: Diagnosis not present

## 2020-04-03 DIAGNOSIS — R001 Bradycardia, unspecified: Secondary | ICD-10-CM | POA: Diagnosis not present

## 2020-04-03 DIAGNOSIS — I714 Abdominal aortic aneurysm, without rupture, unspecified: Secondary | ICD-10-CM

## 2020-04-03 NOTE — Patient Instructions (Signed)
Medication Instructions:  Your physician recommends that you continue on your current medications as directed. Please refer to the Current Medication list given to you today.  *If you need a refill on your cardiac medications before your next appointment, please call your pharmacy*   Lab Work: none If you have labs (blood work) drawn today and your tests are completely normal, you will receive your results only by: Marland Kitchen MyChart Message (if you have MyChart) OR . A paper copy in the mail If you have any lab test that is abnormal or we need to change your treatment, we will call you to review the results.   Testing/Procedures:  Implantable Loop Recorder Placement  An implantable loop recorder is a small electronic device that is placed under the skin of your chest. It is about the size of an AA ("double A") battery. The device records the electrical activity of your heart over a long period of time. Your health care provider can download these recordings to monitor your heart. You may need an implantable loop recorder if you have periods of abnormal heart activity (arrhythmias) or unexplained fainting (syncope). The recorder can be left in place for 1 year or longer. Tell a health care provider about:  Any allergies you have.  All medicines you are taking, including vitamins, herbs, eye drops, creams, and over-the-counter medicines.  Any problems you or family members have had with anesthetic medicines.  Any blood disorders you have.  Any surgeries you have had.  Any medical conditions you have.  Whether you are pregnant or may be pregnant. What are the risks? Generally, this is a safe procedure. However, problems may occur, including:  Infection.  Bleeding.  Allergic reactions to anesthetic medicines.  Damage to nerves or blood vessels.  Failure of the device to work. This could require another surgery to replace it. What happens before the procedure?   You may have a  physical exam, blood tests, and imaging tests of your heart, such as a chest X-ray.  Follow instructions from your health care provider about eating or drinking restrictions.  Ask your health care provider about: ? Changing or stopping your regular medicines. This is especially important if you are taking diabetes medicines or blood thinners. ? Taking medicines such as aspirin and ibuprofen. These medicines can thin your blood. Do not take these medicines unless your health care provider tells you to take them. ? Taking over-the-counter medicines, vitamins, herbs, and supplements.  Ask your health care provider how your surgical site will be marked or identified.  Ask your health care provider what steps will be taken to help prevent infection. These may include: ? Removing hair at the surgery site. ? Washing skin with a germ-killing soap.  Plan to have someone take you home from the hospital or clinic.  Plan to have a responsible adult care for you for at least 24 hours after you leave the hospital or clinic. This is important.  Do not use any products that contain nicotine or tobacco, such as cigarettes and e-cigarettes. If you need help quitting, ask your health care provider. What happens during the procedure?  An IV will be inserted into one of your veins.  You may be given one or more of the following: ? A medicine to help you relax (sedative). ? A medicine to numb the area (local anesthetic).  A small incision will be made on the left side of your upper chest.  A pocket will be created under your  skin.  The device will be placed in the pocket.  The incision will be closed with stitches (sutures) or adhesive strips.  A bandage (dressing) will be placed over the incision. The procedure may vary among health care providers and hospitals. What happens after the procedure?  Your blood pressure, heart rate, breathing rate, and blood oxygen level will be monitored until you  leave the hospital or clinic.  You may be able to go home on the day of your surgery. Before you go home: ? Your health care provider will program your recorder. ? You will learn how to trigger your device with a handheld activator. ? You will learn how to send recordings to your health care provider. ? You will get an ID card for your device, and you will be told when to use it.  Do not drive for 24 hours if you were given a sedative during your procedure. Summary  An implantable loop recorder is a small electronic device that is placed under the skin of your chest to monitor your heart over a long period of time.  The recorder can be left in place for 1 year or longer.  Plan to have someone take you home from the hospital or clinic. This information is not intended to replace advice given to you by your health care provider. Make sure you discuss any questions you have with your health care provider. Document Revised: 09/21/2017 Document Reviewed: 09/02/2017 Elsevier Patient Education  2020 [employer].    Follow-Up: At [facility], you and your health needs are our priority.  As part of our continuing mission to provide you with exceptional heart care, we have created designated Provider Care Teams.  These Care Teams include your primary Cardiologist (physician) and Advanced Practice Providers (APPs -  Physician Assistants and Nurse Practitioners) who all work together to provide you with the care you need, when you need it.  We recommend signing up for the patient portal called "MyChart".  Sign up information is provided on this After Visit Summary.  MyChart is used to connect with patients for Virtual Visits (Telemedicine).  Patients are able to view lab/test results, encounter notes, upcoming appointments, etc.  Non-urgent messages can be sent to your provider as well.   To learn more about what you can do with MyChart, go to [email or web address].    Your next appointment:     6 month(s)  The format for your next appointment:   In Person  Provider:   Sherryl Manges, MD   Other Instructions

## 2020-04-10 ENCOUNTER — Encounter: Payer: Self-pay | Admitting: Family Medicine

## 2020-04-10 ENCOUNTER — Ambulatory Visit (INDEPENDENT_AMBULATORY_CARE_PROVIDER_SITE_OTHER): Payer: Medicare Other | Admitting: Family Medicine

## 2020-04-10 ENCOUNTER — Other Ambulatory Visit: Payer: Self-pay

## 2020-04-10 VITALS — BP 124/62 | Temp 97.9°F | Ht 73.5 in | Wt 189.4 lb

## 2020-04-10 DIAGNOSIS — R600 Localized edema: Secondary | ICD-10-CM | POA: Diagnosis not present

## 2020-04-10 DIAGNOSIS — K5900 Constipation, unspecified: Secondary | ICD-10-CM

## 2020-04-10 DIAGNOSIS — N183 Chronic kidney disease, stage 3 unspecified: Secondary | ICD-10-CM

## 2020-04-10 MED ORDER — TORSEMIDE 20 MG PO TABS: 40.0000 mg | ORAL_TABLET | Freq: Every day | ORAL | 5 refills | Status: DC

## 2020-04-10 NOTE — Patient Instructions (Signed)
It was very nice to see you today!  We will increase her torsemide to 40 mg daily.  I will also place a referral for you to see a kidney doctor.  Please let me know if you have ongoing issues with constipation or diarrhea.  Take care, Dr [name]  Please try these tips to maintain a healthy lifestyle:   Eat at least 3 REAL meals and 1-2 snacks per day.  Aim for no more than 5 hours between eating.  If you eat breakfast, please do so within one hour of getting up.    Each meal should contain half fruits/vegetables, one quarter protein, and one quarter carbs (no bigger than a computer mouse)   Cut down on sweet beverages. This includes juice, soda, and sweet tea.     Drink at least 1 glass of water with each meal and aim for at least 8 glasses per day   Exercise at least 150 minutes every week.

## 2020-04-10 NOTE — Assessment & Plan Note (Addendum)
Worsened. Will increased torsemide to 40mg  daily.  No obvious discussed i infection today.  Dortance of salt avoidance and leg elevation.

## 2020-04-10 NOTE — Progress Notes (Signed)
   Steven Kent is a 85 y.o. male who presents today for an office visit.  Assessment/Plan:  New/Acute Problems: Constipation Doing better. Encouraged daily miralax and good oral hydration. Discussed reseasons to return to care.  UTI Resolving with keflex. No red flags. Discussed reasons to return to care.   Chronic Problems Addressed Today: Leg edema Worsened. Will increased torsemide to 40mg  daily.  No obvious discussed i infection today.  Dortance of salt avoidance and leg elevation.      Subjective:  HPI:  Patient here for follow-up.  Recently came back from a cruise.  Had to go to the ED in .  Was found to be constipated and underwent disimpaction.  He has done well since then.  Is been having normal bowel movements.  Is also diagnosed with UTI was started on Keflex.  The symptoms have improved.  He has also noticed increased long-term right lower extremity.  Developed a blister there a few weeks ago.  This is been treated at home by family members.  Had some pain to the area as well.  He has noticed that torsemide no longer seems to make him urinate.  No fevers or chills.        Objective:  Physical Exam: BP 124/62   Temp 97.9 F (36.6 C) (Temporal)   Ht 6' 1.5" (1.867 m)   Wt 189 lb 6.4 oz (85.9 kg)   SpO2 100%   BMI 24.65 kg/m   Gen: No acute distress, resting comfortably MSK: Right lower extremity with 4+ pitting edema.  What appears to be healed bulla on the posterior leg. Neuro: Grossly normal, moves all extremities Psych: Normal affect and thought content  Time Spent: 43 minutes of total time was spent on the date of the encounter performing the following actions: chart review prior to seeing the patient, obtaining history, performing a medically necessary exam, counseling on the treatment plan, placing orders, and documenting in our EHR.        [place]. Katina Degree, MD 04/10/2020 4:55 PM

## 2020-05-24 DIAGNOSIS — I639 Cerebral infarction, unspecified: Secondary | ICD-10-CM | POA: Insufficient documentation

## 2020-05-26 NOTE — Progress Notes (Signed)
Patient Care Team: Ardith Dark, MD as PCP - General (Family Medicine) Nahser, Deloris Ping, MD as PCP - Cardiology (Cardiology) Van Clines, MD as Consulting Physician (Neurology)   HPI  Steven Kent is a 85 y.o. male Seen in followup for spells, the cause of which is unclear. 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  The spells, in his wifes mind, are distinct from sleep n that in the latter, he responds to voice and shaking.  Here for loop recorder insertion   Records and Results Reviewed   Past Medical History:  Diagnosis Date   Abdominal aortic aneurysm (AAA) (HCC)    Hypertension    Stroke [facility])     Past Surgical History:  Procedure Laterality Date   CATARACT EXTRACTION, BILATERAL     rt eye 10/09/2014 lt eye 11/06/2014   DENTAL SURGERY     REFRACTIVE SURGERY  01/2015    Current Meds  Medication Sig   amLODipine (NORVASC) 2.5 MG tablet Take 2.5 mg by mouth daily.   atorvastatin (LIPITOR) 40 MG tablet TAKE 1 TABLET(40 MG) BY MOUTH EVERY EVENING   brimonidine (ALPHAGAN) 0.15 % ophthalmic solution Place 1 drop into both eyes daily.   cephALEXin (KEFLEX) 500 MG capsule Take 500 mg by mouth every 6 (six) hours.   dorzolamide (TRUSOPT) 2 % ophthalmic solution Place 1 drop into both eyes 2 (two) times daily.   gabapentin (NEURONTIN) 400 MG capsule TAKE 1 CAPSULE(400 MG) BY MOUTH TWICE DAILY   latanoprost (XALATAN) 0.005 % ophthalmic solution Place 1 drop into both eyes at bedtime.   oxybutynin (DITROPAN) 5 MG tablet Take 1 tablet (5 mg total) by mouth 2 (two) times daily.    torsemide (DEMADEX) 20 MG tablet Take 2 tablets (40 mg total) by mouth daily.   Vitamin D, Ergocalciferol, (DRISDOL) 1.25 MG (50000 UNIT) CAPS capsule TAKE 1 CAPSULE BY MOUTH EVERY 7 DAYS   XARELTO 20 MG TABS tablet TAKE 1 TABLET(20 MG) BY MOUTH EVERY EVENING    Allergies  Allergen Reactions   Aspirin Other (See Comments)    Full Strength Asprin-325 mg causes upset stomach. Can take 81 mg.      Review of Systems negative except from HPI and PMH  Physical Exam BP 128/68    Pulse (!) 54    Ht 6' 1.5" (1.867 m)    Wt 188 lb 9.6 oz (85.5 kg)    SpO2 99%    BMI 24.55 kg/m  Well developed and nourished in no acute distress HENT normal weaing eye patch over R eye  Neck supple   Clear Regular rate and rhythm, no murmurs or gallops Abd-soft with active BS No Clubbing cyanosis edema Skin-warm and dry A & Oriented  R hemiparesis and contracture  ECG      CrCl cannot be calculated (Patient's most recent lab result is older than the maximum 21 days allowed.).   Assessment and  Plan  Stroke w residual HP  Spells  Bradycardia  VTE  Hypertension  BP well controlled   On Anticoagulation;  No bleeding issues   No intercurrent spells   .  Pre op Dx spells/presyncope Post op Dx Same  Procedure  Loop Recorder implantation  After routine prep and drape of the left parasternal area, a small incision was created. A Medtronic LINQ Reveal Loop Recorder  Serial Number  [id number] was inserted.    SteriStrip dressing was  applied.  The patient tolerated the procedure without apparent complication.  EBL < 10cc   Current medicines are reviewed at length with the patient today .  The patient does not   have concerns regarding medicines.

## 2020-05-27 ENCOUNTER — Ambulatory Visit (INDEPENDENT_AMBULATORY_CARE_PROVIDER_SITE_OTHER): Payer: Medicare Other | Admitting: Internal Medicine

## 2020-05-27 ENCOUNTER — Encounter: Payer: Self-pay | Admitting: Internal Medicine

## 2020-05-27 ENCOUNTER — Other Ambulatory Visit: Payer: Self-pay

## 2020-05-27 VITALS — BP 128/68 | HR 54 | Ht 73.5 in | Wt 188.6 lb

## 2020-05-27 DIAGNOSIS — R55 Syncope and collapse: Secondary | ICD-10-CM

## 2020-05-27 NOTE — Patient Instructions (Addendum)
Medication Instructions:  Your physician recommends that you continue on your current medications as directed. Please refer to the Current Medication list given to you today.  *If you need a refill on your cardiac medications before your next appointment, please call your pharmacy*   Lab Work: None ordered.  If you have labs (blood work) drawn today and your tests are completely normal, you will receive your results only by: Marland Kitchen MyChart Message (if you have MyChart) OR . A paper copy in the mail If you have any lab test that is abnormal or we need to change your treatment, we will call you to review the results.   Testing/Procedures: None ordered.    Follow-Up: At [facility], you and your health needs are our priority.  As part of our continuing mission to provide you with exceptional heart care, we have created designated Provider Care Teams.  These Care Teams include your primary Cardiologist (physician) and Advanced Practice Providers (APPs -  Physician Assistants and Nurse Practitioners) who all work together to provide you with the care you need, when you need it.  We recommend signing up for the patient portal called "MyChart".  Sign up information is provided on this After Visit Summary.  MyChart is used to connect with patients for Virtual Visits (Telemedicine).  Patients are able to view lab/test results, encounter notes, upcoming appointments, etc.  Non-urgent messages can be sent to your provider as well.   To learn more about what you can do with MyChart, go to [email or web address].    Your next appointment:   Follow up with Dr Graciela Husbands as needed.  Wound Care, Adult Taking care of your wound properly can help to prevent pain, infection, and scarring. It can also help your wound to heal more quickly.  How to care for your wound Wound care  Remove large bandage on Sunday.      Follow instructions from your health care provider about how to take care of your wound.  Make sure you: ? Wash your hands with soap and water before you change the bandage (dressing). If soap and water are not available, use hand sanitizer. ? Change your dressing as told by your health care provider. ?  These skin closures may need to stay in place for 2 weeks or longer. If adhesive strip edges start to loosen and curl up, you may trim the loose edges. Do not remove adhesive strips completely unless your health care provider tells you to do that.  Check your wound area every day for signs of infection. Check for: ? Redness, swelling, or pain. ? Fluid or blood. ? Warmth. ? Pus or a bad smell.  Ask your health care provider if you should clean the wound with mild soap and water. Doing this may include: ? Using a clean towel to pat the wound dry after cleaning it. Do not rub or scrub the wound. ? Applying a cream or ointment. Do this only as told by your health care provider. ? Covering the incision with a clean dressing.  Ask your health care provider when you can leave the wound uncovered.  Keep the dressing dry until your health care provider says it can be removed. Do not take baths, swim, use a hot tub, or do anything that would put the wound underwater until your health care provider approves. Ask your health care provider if you can take showers. You may only be allowed to take sponge baths. Medicines   Take over-the-counter and prescription  medicines only as told by your health care provider. If you were prescribed pain medicine, take it 30 or more minutes before you do any wound care or as told by your health care provider. General instructions  Return to your normal activities as told by your health care provider. Ask your health care provider what activities are safe.  Do not scratch or pick at the wound.  Do not use any products that contain nicotine or tobacco, such as cigarettes and e-cigarettes. These may delay wound healing. If you need help quitting, ask your  health care provider.  Keep all follow-up visits as told by your health care provider. This is important.  Eat a diet that includes protein, vitamin A, vitamin C, and other nutrient-rich foods to help the wound heal. ? Foods rich in protein include meat, dairy, beans, nuts, and other sources. ? Foods rich in vitamin A include carrots and dark green, leafy vegetables. ? Foods rich in vitamin C include citrus, tomatoes, and other fruits and vegetables. ? Nutrient-rich foods have protein, carbohydrates, fat, vitamins, or minerals. Eat a variety of healthy foods including vegetables, fruits, and whole grains. Contact a health care provider if:  You received a tetanus shot and you have swelling, severe pain, redness, or bleeding at the injection site.  Your pain is not controlled with medicine.  You have redness, swelling, or pain around the wound.  You have fluid or blood coming from the wound.  Your wound feels warm to the touch.  You have pus or a bad smell coming from the wound.  You have a fever or chills.  You are nauseous or you vomit.  You are dizzy. Get help right away if:  You have a red streak going away from your wound.  The edges of the wound open up and separate.  Your wound is bleeding, and the bleeding does not stop with gentle pressure.  You have a rash.  You faint.  You have trouble breathing. Summary  Always wash your hands with soap and water before changing your bandage (dressing).  To help with healing, eat foods that are rich in protein, vitamin A, vitamin C, and other nutrients.  Check your wound every day for signs of infection. Contact your health care provider if you suspect that your wound is infected. This information is not intended to replace advice given to you by your health care provider. Make sure you discuss any questions you have with your health care provider. Document Revised: 11/05/2018 Document Reviewed: 02/02/2016 Elsevier Patient  Education  2020 [employer].

## 2020-06-05 ENCOUNTER — Other Ambulatory Visit: Payer: Self-pay

## 2020-06-05 ENCOUNTER — Encounter: Payer: Self-pay | Admitting: Podiatry

## 2020-06-05 ENCOUNTER — Ambulatory Visit (INDEPENDENT_AMBULATORY_CARE_PROVIDER_SITE_OTHER): Payer: Medicare Other | Admitting: Podiatry

## 2020-06-05 DIAGNOSIS — D689 Coagulation defect, unspecified: Secondary | ICD-10-CM

## 2020-06-05 DIAGNOSIS — M79675 Pain in left toe(s): Secondary | ICD-10-CM | POA: Diagnosis not present

## 2020-06-05 DIAGNOSIS — M79674 Pain in right toe(s): Secondary | ICD-10-CM

## 2020-06-05 DIAGNOSIS — R609 Edema, unspecified: Secondary | ICD-10-CM

## 2020-06-05 DIAGNOSIS — B351 Tinea unguium: Secondary | ICD-10-CM

## 2020-06-05 NOTE — Progress Notes (Signed)
This patient returns to my office for at risk foot care.  This patient requires this care by a professional since this patient will be at risk due to having kidney disease,  CVA and neuropathy.  This patient is unable to cut nails himself since the patient cannot reach his nails.These nails are painful walking and wearing shoes.  This patient presents for at risk foot care today.  General Appearance  Alert, conversant and in no acute stress.  Vascular  Dorsalis pedis and posterior tibial  pulses are not   palpable  Both feet due to swelling...  Capillary return is within normal limits  bilaterally. Temperature is increased right leg/ foot.  Neurologic  Deferred . Muscle power within normal limits bilaterally.  Nails Thick disfigured discolored nails with subungual debris  from hallux to fifth toes bilaterally. No evidence of bacterial infection or drainage bilaterally.  Orthopedic  No limitations of motion  feet .  No crepitus or effusions noted.  No bony pathology or digital deformities noted.  Skin  normotropic skin with no porokeratosis noted bilaterally.  No signs of infections or ulcers noted.   There is severe swelling with increased temperature right foot/legs.  Onychomycosis  Pain in right toes  Pain in left toes  Swelling right leg/foot.  Consent was obtained for treatment procedures.   Mechanical debridement of nails 1-5  bilaterally performed with a nail nipper.  Filed with dremel without incident.  During debridement of his nails it was noted that he had severely  swollen right foot with an severely swollen right leg.  Patient states that he has been experiencing throbbing pain noted in his right leg and foot.   Patient is presently taking Xarelto tablets.   Return office visit   3 months                    Told patient to return for periodic foot care due to possible  at risk complications.   Helane Gunther DPM

## 2020-06-15 ENCOUNTER — Encounter: Payer: Self-pay | Admitting: Family Medicine

## 2020-06-15 ENCOUNTER — Ambulatory Visit (INDEPENDENT_AMBULATORY_CARE_PROVIDER_SITE_OTHER): Payer: Medicare Other | Admitting: Family Medicine

## 2020-06-15 ENCOUNTER — Other Ambulatory Visit: Payer: Self-pay

## 2020-06-15 VITALS — BP 116/67 | HR 79 | Temp 97.4°F | Ht 73.5 in | Wt 192.0 lb

## 2020-06-15 DIAGNOSIS — N3281 Overactive bladder: Secondary | ICD-10-CM | POA: Diagnosis not present

## 2020-06-15 DIAGNOSIS — I69351 Hemiplegia and hemiparesis following cerebral infarction affecting right dominant side: Secondary | ICD-10-CM

## 2020-06-15 DIAGNOSIS — E785 Hyperlipidemia, unspecified: Secondary | ICD-10-CM

## 2020-06-15 DIAGNOSIS — I714 Abdominal aortic aneurysm, without rupture, unspecified: Secondary | ICD-10-CM

## 2020-06-15 DIAGNOSIS — R739 Hyperglycemia, unspecified: Secondary | ICD-10-CM

## 2020-06-15 DIAGNOSIS — Z0001 Encounter for general adult medical examination with abnormal findings: Secondary | ICD-10-CM | POA: Diagnosis not present

## 2020-06-15 DIAGNOSIS — I10 Essential (primary) hypertension: Secondary | ICD-10-CM

## 2020-06-15 NOTE — Progress Notes (Signed)
Chief Complaint:  Steven Kent is a 85 y.o. male who presents today for his annual comprehensive physical exam.    Assessment/Plan:  Chronic Problems Addressed Today: Overactive bladder Stable.  Continue oxybutynin 5 mg twice daily.  Hyperlipidemia Continue Lipitor 40 mg daily.  Check lipids, CBC, CMET, TSH.  Essential hypertension At goal.  Continue amlodipine 2.5 mg daily.  Abdominal aortic aneurysm (AAA) (HCC) Continue management per vascular surgery.  Hemiparesis affecting right side as late effect of cerebrovascular accident (HCC) Stable.  Continue statin and Xarelto.   Preventative Healthcare: Check CBC, CMET, TSH, lipid panel.  Up-to-date on vaccines.  Will be getting Covid booster early next year.  Patient Counseling(The following topics were reviewed and/or handout was given):  -Nutrition: Stressed importance of moderation in sodium/caffeine intake, saturated fat and cholesterol, caloric balance, sufficient intake of fresh fruits, vegetables, and fiber.  -Stressed the importance of regular exercise.   -Substance Abuse: Discussed cessation/primary prevention of tobacco, alcohol, or other drug use; driving or other dangerous activities under the influence; availability of treatment for abuse.   -Injury prevention: Discussed safety belts, safety helmets, smoke detector, smoking near bedding or upholstery.   -Sexuality: Discussed sexually transmitted diseases, partner selection, use of condoms, avoidance of unintended pregnancy and contraceptive alternatives.   -Dental health: Discussed importance of regular tooth brushing, flossing, and dental visits.  -Health maintenance and immunizations reviewed. Please refer to Health maintenance section.  Return to care in 1 year for next preventative visit.     Subjective:  HPI:  He has no acute complaints today. See A/p for status of chronic conditions.   Lifestyle Diet: Balanced. Trying to cut down on salt.    Exercise: Limited.   Depression screen PHQ 2/9 02/28/2020  Decreased Interest 0  Down, Depressed, Hopeless 0  PHQ - 2 Score 0    There are no preventive care reminders to display for this patient.   ROS: Per HPI, otherwise a complete review of systems was negative.   PMH:  The following were reviewed and entered/updated in epic: Past Medical History:  Diagnosis Date  . Abdominal aortic aneurysm (AAA) (HCC)   . Hypertension   . Stroke [facility])    Patient Active Problem List   Diagnosis Date Noted  . Essential hypertension 08/16/2018  . Hypertensive kidney disease with chronic kidney disease stage III 08/16/2018  . VTE (venous thromboembolism) 08/16/2018  . Peripheral neuropathy 08/16/2018  . Glaucoma 08/16/2018  . Onychomycosis 08/16/2018  . Leg edema 08/16/2018  . Hyperlipidemia 08/16/2018  . Overactive bladder 08/16/2018  . Coronary artery disease involving native heart without angina pectoris 08/16/2018  . Hemiparesis affecting right side as late effect of cerebrovascular accident (HCC)   . Abdominal aortic aneurysm (AAA) [facility])    Past Surgical History:  Procedure Laterality Date  . CATARACT EXTRACTION, BILATERAL     rt eye 10/09/2014 lt eye 11/06/2014  . DENTAL SURGERY    . REFRACTIVE SURGERY  01/2015    Family History  Problem Relation Age of Onset  . Prostate cancer Neg Hx     Medications- reviewed and updated Current Outpatient Medications  Medication Sig Dispense Refill  . amLODipine (NORVASC) 2.5 MG tablet Take 2.5 mg by mouth daily.    Marland Kitchen atorvastatin (LIPITOR) 40 MG tablet TAKE 1 TABLET(40 MG) BY MOUTH EVERY EVENING 90 tablet 3  . brimonidine (ALPHAGAN) 0.15 % ophthalmic solution Place 1 drop into both eyes daily.    . cephALEXin (KEFLEX) 500 MG capsule Take 500  mg by mouth every 6 (six) hours.    . dorzolamide (TRUSOPT) 2 % ophthalmic solution Place 1 drop into both eyes 2 (two) times daily.    Marland Kitchen gabapentin (NEURONTIN) 400 MG capsule TAKE 1 CAPSULE(400  MG) BY MOUTH TWICE DAILY 180 capsule 3  . latanoprost (XALATAN) 0.005 % ophthalmic solution Place 1 drop into both eyes at bedtime.    Marland Kitchen oxybutynin (DITROPAN) 5 MG tablet Take 1 tablet (5 mg total) by mouth 2 (two) times daily. 180 tablet 3  . torsemide (DEMADEX) 10 MG tablet Take by mouth.    . Vitamin D, Ergocalciferol, (DRISDOL) 1.25 MG (50000 UNIT) CAPS capsule TAKE 1 CAPSULE BY MOUTH EVERY 7 DAYS 12 capsule 3  . XARELTO 20 MG TABS tablet TAKE 1 TABLET(20 MG) BY MOUTH EVERY EVENING 90 tablet 3   No current facility-administered medications for this visit.    Allergies-reviewed and updated Allergies  Allergen Reactions  . Aspirin Other (See Comments)    Full Strength Asprin-325 mg causes upset stomach. Can take 81 mg.    Social History   Socioeconomic History  . Marital status: Married    Spouse name: Not on file  . Number of children: Not on file  . Years of education: Not on file  . Highest education level: Not on file  Occupational History  . Occupation: Retired  Tobacco Use  . Smoking status: Former Games developer  . Smokeless tobacco: Never Used  Substance and Sexual Activity  . Alcohol use: Never  . Drug use: Never  . Sexual activity: Not on file  Other Topics Concern  . 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 Difficulty of Paying Living Expenses: Not hard at all  Food Insecurity: No Food Insecurity  . Worried About Programme researcher, broadcasting/film/video in the Last Year: Never true  . Ran Out of Food in the Last Year: Never true  Transportation Needs: No Transportation Needs  . Lack of Transportation (Medical): No  . Lack of Transportation (Non-Medical): No  Physical Activity: Inactive  . Days of Exercise per Week: 0 days  . Minutes of Exercise per Session: 0 min  Stress: No Stress Concern Present  . Feeling of  Stress : Not at all  Social Connections: Moderately Integrated  . Frequency of Communication with Friends and Family: More than three times a week  . Frequency of Social Gatherings with Friends and Family: Never  . Attends Religious Services: More than 4 times per year  . Active Member of Clubs or Organizations: No  . Attends Banker Meetings: Never  . Marital Status: Married        Objective:  Physical Exam: BP 116/67   Pulse 79   Temp (!) 97.4 F (36.3 C) (Temporal)   Ht 6' 1.5" (1.867 m)   Wt 192 lb (87.1 kg)   SpO2 97%   BMI 24.99 kg/m   Body mass index is 24.99 kg/m. Wt Readings from Last 3 Encounters:  06/15/20 192 lb (87.1 kg)  05/27/20 188 lb 9.6 oz (85.5 kg)  04/10/20 189 lb 6.4 oz (85.9 kg)   Gen: NAD, resting comfortably HEENT: TMs normal bilaterally. OP clear. No thyromegaly noted.  CV: RRR with 2/6 murmurs appreciated Pulm: NWOB,  CTAB with no crackles, wheezes, or rhonchi GI: Normal bowel sounds present. Soft, Nontender, Nondistended. MSK: no edema, cyanosis, or clubbing noted Skin: warm, dry Neuro: CN2-12 grossly intact. Strength 5/5 in upper and lower extremities. Reflexes symmetric and intact bilaterally.  Psych: Normal affect and thought content     Melyssa Signor M. [name], MD 06/15/2020 10:36 AM

## 2020-06-15 NOTE — Patient Instructions (Signed)
It was very nice to see you today!  No changes today.  We will check blood work today.  I will see you back in 6 months.  Please come back to see me sooner if needed.  Take care, Dr Jerline Pain  Please try these tips to maintain a healthy lifestyle:   Eat at least 3 REAL meals and 1-2 snacks per day.  Aim for no more than 5 hours between eating.  If you eat breakfast, please do so within one hour of getting up.    Each meal should contain half fruits/vegetables, one quarter protein, and one quarter carbs (no bigger than a computer mouse)   Cut down on sweet beverages. This includes juice, soda, and sweet tea.     Drink at least 1 glass of water with each meal and aim for at least 8 glasses per day   Exercise at least 150 minutes every week.    Preventive Care 75 Years and Older, Male Preventive care refers to lifestyle choices and visits with your health care provider that can promote health and wellness. This includes:  A yearly physical exam. This is also called an annual well check.  Regular dental and eye exams.  Immunizations.  Screening for certain conditions.  Healthy lifestyle choices, such as diet and exercise. What can I expect for my preventive care visit? Physical exam Your health care provider will check:  Height and weight. These may be used to calculate body mass index (BMI), which is a measurement that tells if you are at a healthy weight.  Heart rate and blood pressure.  Your skin for abnormal spots. Counseling Your health care provider may ask you questions about:  Alcohol, tobacco, and drug use.  Emotional well-being.  Home and relationship well-being.  Sexual activity.  Eating habits.  History of falls.  Memory and ability to understand (cognition).  Work and work Statistician. What immunizations do I need?  Influenza (flu) vaccine  This is recommended every year. Tetanus, diphtheria, and pertussis (Tdap) vaccine  You may need  a Td booster every 10 years. Varicella (chickenpox) vaccine  You may need this vaccine if you have not already been vaccinated. Zoster (shingles) vaccine  You may need this after age 67. Pneumococcal conjugate (PCV13) vaccine  One dose is recommended after age 10. Pneumococcal polysaccharide (PPSV23) vaccine  One dose is recommended after age 37. Measles, mumps, and rubella (MMR) vaccine  You may need at least one dose of MMR if you were born in 1957 or later. You may also need a second dose. Meningococcal conjugate (MenACWY) vaccine  You may need this if you have certain conditions. Hepatitis A vaccine  You may need this if you have certain conditions or if you travel or work in places where you may be exposed to hepatitis A. Hepatitis B vaccine  You may need this if you have certain conditions or if you travel or work in places where you may be exposed to hepatitis B. Haemophilus influenzae type b (Hib) vaccine  You may need this if you have certain conditions. You may receive vaccines as individual doses or as more than one vaccine together in one shot (combination vaccines). Talk with your health care provider about the risks and benefits of combination vaccines. What tests do I need? Blood tests  Lipid and cholesterol levels. These may be checked every 5 years, or more frequently depending on your overall health.  Hepatitis C test.  Hepatitis B test. Screening  Lung cancer  screening. You may have this screening every year starting at age 76 if you have a 30-pack-year history of smoking and currently smoke or have quit within the past 15 years.  Colorectal cancer screening. All adults should have this screening starting at age 58 and continuing until age 53. Your health care provider may recommend screening at age 85 if you are at increased risk. You will have tests every 1-10 years, depending on your results and the type of screening test.  Prostate cancer screening.  Recommendations will vary depending on your family history and other risks.  Diabetes screening. This is done by checking your blood sugar (glucose) after you have not eaten for a while (fasting). You may have this done every 1-3 years.  Abdominal aortic aneurysm (AAA) screening. You may need this if you are a current or former smoker.  Sexually transmitted disease (STD) testing. Follow these instructions at home: Eating and drinking  Eat a diet that includes fresh fruits and vegetables, whole grains, lean protein, and low-fat dairy products. Limit your intake of foods with high amounts of sugar, saturated fats, and salt.  Take vitamin and mineral supplements as recommended by your health care provider.  Do not drink alcohol if your health care provider tells you not to drink.  If you drink alcohol: ? Limit how much you have to 0-2 drinks a day. ? Be aware of how much alcohol is in your drink. In the [place] one drink equals one 12 oz bottle of beer (355 mL), one 5 oz glass of wine (148 mL), or one 1 oz glass of hard liquor (44 mL). Lifestyle  Take daily care of your teeth and gums.  Stay active. Exercise for at least 30 minutes on 5 or more days each week.  Do not use any products that contain nicotine or tobacco, such as cigarettes, e-cigarettes, and chewing tobacco. If you need help quitting, ask your health care provider.  If you are sexually active, practice safe sex. Use a condom or other form of protection to prevent STIs (sexually transmitted infections).  Talk with your health care provider about taking a low-dose aspirin or statin. What's next?  Visit your health care provider once a year for a well check visit.  Ask your health care provider how often you should have your eyes and teeth checked.  Stay up to date on all vaccines. This information is not intended to replace advice given to you by your health care provider. Make sure you discuss any questions you have with  your health care provider. Document Revised: 07/12/2018 Document Reviewed: 07/12/2018 Elsevier Patient Education  2020 [employer].

## 2020-06-15 NOTE — Assessment & Plan Note (Signed)
Stable.  Continue statin and Xarelto.

## 2020-06-15 NOTE — Assessment & Plan Note (Signed)
Stable.  Continue oxybutynin 5 mg twice daily.

## 2020-06-15 NOTE — Assessment & Plan Note (Signed)
Continue management per vascular surgery. °

## 2020-06-15 NOTE — Assessment & Plan Note (Signed)
At goal.  Continue amlodipine 2.5 mg daily. 

## 2020-06-15 NOTE — Assessment & Plan Note (Signed)
Continue Lipitor 40 mg daily.  Check lipids, CBC, CMET, TSH.

## 2020-06-16 LAB — CBC
HCT: 37.8 % — ABNORMAL LOW (ref 38.5–50.0)
Hemoglobin: 12.6 g/dL — ABNORMAL LOW (ref 13.2–17.1)
MCH: 32.1 pg (ref 27.0–33.0)
MCHC: 33.3 g/dL (ref 32.0–36.0)
MCV: 96.2 fL (ref 80.0–100.0)
MPV: 11.1 fL (ref 7.5–12.5)
Platelets: 126 10*3/uL — ABNORMAL LOW (ref 140–400)
RBC: 3.93 10*6/uL — ABNORMAL LOW (ref 4.20–5.80)
RDW: 13.7 % (ref 11.0–15.0)
WBC: 4 10*3/uL (ref 3.8–10.8)

## 2020-06-16 LAB — HEMOGLOBIN A1C
Hgb A1c MFr Bld: 5.7 % of total Hgb — ABNORMAL HIGH (ref <5.7)
Mean Plasma Glucose: 117 (calc)
eAG (mmol/L): 6.5 (calc)

## 2020-06-16 LAB — COMPREHENSIVE METABOLIC PANEL
AG Ratio: 1.2 (calc) (ref 1.0–2.5)
ALT: 10 U/L (ref 9–46)
AST: 16 U/L (ref 10–35)
Albumin: 3.6 g/dL (ref 3.6–5.1)
Alkaline phosphatase (APISO): 55 U/L (ref 35–144)
BUN/Creatinine Ratio: 13 (calc) (ref 6–22)
BUN: 19 mg/dL (ref 7–25)
CO2: 34 mmol/L — ABNORMAL HIGH (ref 20–32)
Calcium: 8.8 mg/dL (ref 8.6–10.3)
Chloride: 102 mmol/L (ref 98–110)
Creat: 1.45 mg/dL — ABNORMAL HIGH (ref 0.70–1.11)
Globulin: 3.1 g/dL (calc) (ref 1.9–3.7)
Glucose, Bld: 103 mg/dL — ABNORMAL HIGH (ref 65–99)
Potassium: 4.2 mmol/L (ref 3.5–5.3)
Sodium: 139 mmol/L (ref 135–146)
Total Bilirubin: 0.7 mg/dL (ref 0.2–1.2)
Total Protein: 6.7 g/dL (ref 6.1–8.1)

## 2020-06-16 LAB — LIPID PANEL
Cholesterol: 123 mg/dL (ref <200)
HDL: 47 mg/dL (ref >=40)
LDL Cholesterol (Calc): 62 mg/dL (calc)
Non-HDL Cholesterol (Calc): 76 mg/dL (calc) (ref <130)
Total CHOL/HDL Ratio: 2.6 (calc) (ref <5.0)
Triglycerides: 48 mg/dL (ref <150)

## 2020-06-16 LAB — TSH: TSH: 2.34 mIU/L (ref 0.40–4.50)

## 2020-06-16 NOTE — Progress Notes (Signed)
Please inform patient of the following:  Blood work is all stable. Do not need to make any changes. We will see him again in 6 months.  Katina Degree. [name], MD 06/16/2020 1:59 PM

## 2020-06-30 ENCOUNTER — Ambulatory Visit (INDEPENDENT_AMBULATORY_CARE_PROVIDER_SITE_OTHER): Payer: Medicare Other

## 2020-06-30 DIAGNOSIS — R55 Syncope and collapse: Secondary | ICD-10-CM | POA: Diagnosis not present

## 2020-06-30 LAB — CUP PACEART REMOTE DEVICE CHECK
Date Time Interrogation Session: 20211130082441
Implantable Pulse Generator Implant Date: 20211027

## 2020-07-07 ENCOUNTER — Encounter (HOSPITAL_COMMUNITY): Payer: Self-pay

## 2020-07-07 ENCOUNTER — Other Ambulatory Visit: Payer: Self-pay

## 2020-07-07 ENCOUNTER — Emergency Department (HOSPITAL_COMMUNITY)
Admission: EM | Admit: 2020-07-07 | Discharge: 2020-07-07 | Disposition: A | Discharge status: Home / Self Care | Payer: Medicare Other | Attending: Emergency Medicine | Admitting: Emergency Medicine

## 2020-07-07 ENCOUNTER — Emergency Department (HOSPITAL_COMMUNITY): Payer: Medicare Other

## 2020-07-07 DIAGNOSIS — L97812 Non-pressure chronic ulcer of other part of right lower leg with fat layer exposed: Secondary | ICD-10-CM | POA: Insufficient documentation

## 2020-07-07 DIAGNOSIS — I2510 Atherosclerotic heart disease of native coronary artery without angina pectoris: Secondary | ICD-10-CM | POA: Insufficient documentation

## 2020-07-07 DIAGNOSIS — R600 Localized edema: Secondary | ICD-10-CM | POA: Diagnosis not present

## 2020-07-07 DIAGNOSIS — Z79899 Other long term (current) drug therapy: Secondary | ICD-10-CM | POA: Insufficient documentation

## 2020-07-07 DIAGNOSIS — L97912 Non-pressure chronic ulcer of unspecified part of right lower leg with fat layer exposed: Secondary | ICD-10-CM

## 2020-07-07 DIAGNOSIS — Z87891 Personal history of nicotine dependence: Secondary | ICD-10-CM | POA: Diagnosis not present

## 2020-07-07 DIAGNOSIS — M79604 Pain in right leg: Secondary | ICD-10-CM | POA: Diagnosis present

## 2020-07-07 DIAGNOSIS — H54413A Blindness right eye category 3, normal vision left eye: Secondary | ICD-10-CM | POA: Diagnosis not present

## 2020-07-07 DIAGNOSIS — I129 Hypertensive chronic kidney disease with stage 1 through stage 4 chronic kidney disease, or unspecified chronic kidney disease: Secondary | ICD-10-CM | POA: Diagnosis not present

## 2020-07-07 DIAGNOSIS — N183 Chronic kidney disease, stage 3 unspecified: Secondary | ICD-10-CM | POA: Insufficient documentation

## 2020-07-07 LAB — CBC WITH DIFFERENTIAL/PLATELET
Abs Immature Granulocytes: 0.02 10*3/uL (ref 0.00–0.07)
Basophils Absolute: 0 10*3/uL (ref 0.0–0.1)
Basophils Relative: 1 %
Eosinophils Absolute: 0.1 10*3/uL (ref 0.0–0.5)
Eosinophils Relative: 1 %
HCT: 42.1 % (ref 39.0–52.0)
Hemoglobin: 12.9 g/dL — ABNORMAL LOW (ref 13.0–17.0)
Immature Granulocytes: 0 %
Lymphocytes Relative: 30 %
Lymphs Abs: 1.7 10*3/uL (ref 0.7–4.0)
MCH: 31.1 pg (ref 26.0–34.0)
MCHC: 30.6 g/dL (ref 30.0–36.0)
MCV: 101.4 fL — ABNORMAL HIGH (ref 80.0–100.0)
Monocytes Absolute: 0.6 10*3/uL (ref 0.1–1.0)
Monocytes Relative: 11 %
Neutro Abs: 3.2 10*3/uL (ref 1.7–7.7)
Neutrophils Relative %: 57 %
Platelets: 167 10*3/uL (ref 150–400)
RBC: 4.15 MIL/uL — ABNORMAL LOW (ref 4.22–5.81)
RDW: 14.3 % (ref 11.5–15.5)
WBC: 5.6 10*3/uL (ref 4.0–10.5)
nRBC: 0 % (ref 0.0–0.2)

## 2020-07-07 LAB — COMPREHENSIVE METABOLIC PANEL
ALT: 12 U/L (ref 0–44)
AST: 18 U/L (ref 15–41)
Albumin: 3.2 g/dL — ABNORMAL LOW (ref 3.5–5.0)
Alkaline Phosphatase: 59 U/L (ref 38–126)
Anion gap: 8 (ref 5–15)
BUN: 15 mg/dL (ref 8–23)
CO2: 29 mmol/L (ref 22–32)
Calcium: 8.9 mg/dL (ref 8.9–10.3)
Chloride: 105 mmol/L (ref 98–111)
Creatinine, Ser: 1.19 mg/dL (ref 0.61–1.24)
GFR, Estimated: >60 mL/min (ref >60)
Glucose, Bld: 95 mg/dL (ref 70–99)
Potassium: 3.8 mmol/L (ref 3.5–5.1)
Sodium: 142 mmol/L (ref 135–145)
Total Bilirubin: 0.6 mg/dL (ref 0.3–1.2)
Total Protein: 7.1 g/dL (ref 6.5–8.1)

## 2020-07-07 LAB — LACTIC ACID, PLASMA: Lactic Acid, Venous: 1.4 mmol/L (ref 0.5–1.9)

## 2020-07-07 MED ORDER — CEPHALEXIN 250 MG PO CAPS
500.0000 mg | ORAL_CAPSULE | Freq: Once | ORAL | Status: AC
  Administered 2020-07-07: 500 mg via ORAL
  Filled 2020-07-07: qty 2

## 2020-07-07 MED ORDER — HYDROCODONE-ACETAMINOPHEN 5-325 MG PO TABS
1.0000 | ORAL_TABLET | Freq: Four times a day (QID) | ORAL | 0 refills | Status: DC | PRN
Start: 2020-07-07 — End: 2020-12-12

## 2020-07-07 MED ORDER — HYDROCODONE-ACETAMINOPHEN 5-325 MG PO TABS
1.0000 | ORAL_TABLET | Freq: Once | ORAL | Status: AC
  Administered 2020-07-07: 1 via ORAL
  Filled 2020-07-07: qty 1

## 2020-07-07 MED ORDER — CEPHALEXIN 500 MG PO CAPS: 500.0000 mg | ORAL_CAPSULE | Freq: Four times a day (QID) | ORAL | 0 refills | Status: DC

## 2020-07-07 NOTE — ED Triage Notes (Signed)
Patient with open wound right calf area intermittently since august. Complains of pain and drainage from same.

## 2020-07-07 NOTE — Discharge Instructions (Signed)
You were seen in the emergency department for evaluation of an ulceration on your right leg.  You had lab work and an x-ray that did not show any significant abnormalities.  We recommend daily dressing changes.  Antibiotics.  Pain medications as needed.  We are also setting you up with home health care.  Please return to the emergency department if any worsening or concerning symptoms

## 2020-07-07 NOTE — ED Notes (Signed)
Wound dressed  Education provided  Discharged with family, verbalized understanding

## 2020-07-07 NOTE — ED Provider Notes (Signed)
[facility] EMERGENCY DEPARTMENT Provider Note   CSN: [id number] Arrival date & time: 07/07/20  1358     History No chief complaint on file.   Steven Kent is a 85 y.o. male. He is a history of hypertension and prior stroke which left his right side weak. He has chronic edema of his right lower extremity. He has a wound on his right calf that is been there for a few months. Its been more painful recently and draining. No fevers. Good appetite. No nausea vomiting or diarrhea. Does not normally go to the doctor.  The history is provided by the patient and the spouse.  Leg Pain Location:  Leg Leg location:  R lower leg Pain details:    Quality:  Sharp   Radiates to:  Does not radiate   Severity:  Moderate   Onset quality:  Gradual   Timing:  Intermittent   Progression:  Worsening Chronicity:  New Relieved by:  None tried Worsened by:  Nothing Ineffective treatments:  None tried Associated symptoms: swelling   Associated symptoms: no fever        Past Medical History:  Diagnosis Date  . Abdominal aortic aneurysm (AAA) (HCC)   . Hypertension   . Stroke [facility])     Patient Active Problem List   Diagnosis Date Noted  . Essential hypertension 08/16/2018  . Hypertensive kidney disease with chronic kidney disease stage III 08/16/2018  . VTE (venous thromboembolism) 08/16/2018  . Peripheral neuropathy 08/16/2018  . Glaucoma 08/16/2018  . Onychomycosis 08/16/2018  . Leg edema 08/16/2018  . Hyperlipidemia 08/16/2018  . Overactive bladder 08/16/2018  . Coronary artery disease involving native heart without angina pectoris 08/16/2018  . Hemiparesis affecting right side as late effect of cerebrovascular accident (HCC)   . Abdominal aortic aneurysm (AAA) [facility])     Past Surgical History:  Procedure Laterality Date  . CATARACT EXTRACTION, BILATERAL     rt eye 10/09/2014 lt eye 11/06/2014  . DENTAL SURGERY    . REFRACTIVE SURGERY  01/2015        Family History  Problem Relation Age of Onset  . Prostate cancer Neg Hx     Social History   Tobacco Use  . Smoking status: Former Games developer  . Smokeless tobacco: Never Used  Substance Use Topics  . Alcohol use: Never  . Drug use: Never    Home Medications Prior to Admission medications   Medication Sig Start Date End Date Taking? Authorizing Provider  amLODipine (NORVASC) 2.5 MG tablet Take 2.5 mg by mouth daily.    [provider]  atorvastatin (LIPITOR) 40 MG tablet TAKE 1 TABLET(40 MG) BY MOUTH EVERY EVENING 08/26/19   Ardith Dark, MD  brimonidine (ALPHAGAN) 0.15 % ophthalmic solution Place 1 drop into both eyes daily.    [provider]  cephALEXin (KEFLEX) 500 MG capsule Take 500 mg by mouth every 6 (six) hours. 04/01/20   [provider]  dorzolamide (TRUSOPT) 2 % ophthalmic solution Place 1 drop into both eyes 2 (two) times daily.    [provider]  gabapentin (NEURONTIN) 400 MG capsule TAKE 1 CAPSULE(400 MG) BY MOUTH TWICE DAILY 09/09/19   Ardith Dark, MD  latanoprost (XALATAN) 0.005 % ophthalmic solution Place 1 drop into both eyes at bedtime.    [provider]  oxybutynin (DITROPAN) 5 MG tablet Take 1 tablet (5 mg total) by mouth 2 (two) times daily. 11/05/19   Ardith Dark, MD  torsemide (  DEMADEX) 10 MG tablet Take by mouth. 05/31/20   [provider]  Vitamin D, Ergocalciferol, (DRISDOL) 1.25 MG (50000 UNIT) CAPS capsule TAKE 1 CAPSULE BY MOUTH EVERY 7 DAYS 01/16/20   Ardith Dark, MD  XARELTO 20 MG TABS tablet TAKE 1 TABLET(20 MG) BY MOUTH EVERY EVENING 08/26/19   Ardith Dark, MD    Allergies    Aspirin  Review of Systems   Review of Systems  Constitutional: Negative for fever.  HENT: Negative for sore throat.   Eyes: Negative for pain.  Respiratory: Negative for shortness of breath.   Cardiovascular: Negative for chest pain.  Gastrointestinal: Negative for abdominal pain.  Genitourinary:  Negative for dysuria.  Musculoskeletal: Positive for gait problem.  Skin: Positive for wound. Negative for rash.  Neurological: Negative for headaches.    Physical Exam Updated Vital Signs BP 137/61 (BP Location: Right Arm)   Pulse 63   Temp 98.9 F (37.2 C) (Oral)   Resp 14   SpO2 100%   Physical Exam Vitals and nursing note reviewed.  Constitutional:      Appearance: Normal appearance. He is well-developed.  HENT:     Head: Normocephalic and atraumatic.  Eyes:     Conjunctiva/sclera: Conjunctivae normal.     Comments: Blind right eye  Cardiovascular:     Rate and Rhythm: Normal rate and regular rhythm.     Heart sounds: No murmur heard.   Pulmonary:     Effort: Pulmonary effort is normal. No respiratory distress.     Breath sounds: Normal breath sounds.  Abdominal:     Palpations: Abdomen is soft.     Tenderness: There is no abdominal tenderness.  Musculoskeletal:        General: Swelling and tenderness present.     Cervical back: Neck supple.     Comments: He has marked edema of his right lower extremity mostly in his foot and calf. Approximately 10 cm ulceration on his posterior calf on the right. Unable to feel distal pulses due to edema but extremity is warm.  Skin:    General: Skin is warm and dry.  Neurological:     Mental Status: He is alert. Mental status is at baseline.        ED Results / Procedures / Treatments   Labs (all labs ordered are listed, but only abnormal results are displayed) Labs Reviewed  COMPREHENSIVE METABOLIC PANEL - Abnormal; Notable for the following components:      Result Value   Albumin 3.2 (*)    All other components within normal limits  CBC WITH DIFFERENTIAL/PLATELET - Abnormal; Notable for the following components:   RBC 4.15 (*)    Hemoglobin 12.9 (*)    MCV 101.4 (*)    All other components within normal limits  LACTIC ACID, PLASMA    EKG None  Radiology DG Tibia/Fibula Right  Result Date:  07/07/2020 CLINICAL DATA:  Skin ulcer. EXAM: RIGHT TIBIA AND FIBULA - 2 VIEW COMPARISON:  None. FINDINGS: There is no evidence of an acute fracture or dislocation. Moderate severity degenerative changes seen within the visualized portion of the mid right foot and right knee. There is moderate severity diffuse soft tissue swelling. An ill-defined, superficial soft tissue defect is seen along the posteromedial aspect of the distal right calf. IMPRESSION: Small area of soft tissue ulceration with diffuse soft tissue swelling. While this may be, in part, secondary to the patient's body habitus, diffuse cellulitis cannot be excluded. Electronically Signed  By: Aram Candela M.D.   On: 07/07/2020 20:46    Procedures Procedures (including critical care time)  Medications Ordered in ED Medications  HYDROcodone-acetaminophen (NORCO/VICODIN) 5-325 MG per tablet 1 tablet (has no administration in time range)  cephALEXin (KEFLEX) capsule 500 mg (has no administration in time range)    ED Course  I have reviewed the triage vital signs and the nursing notes.  Pertinent labs & imaging results that were available during my care of the patient were reviewed by me and considered in my medical decision making (see chart for details).    MDM Rules/Calculators/A&P                         This patient complains of painful ulcer right lower leg; this involves an extensive number of treatment Options and is a complaint that carries with it a high risk of complications and Morbidity. The differential includes cellulitis, osteomyelitis, peripheral vascular disease, DVT, tumor  I ordered, reviewed and interpreted labs, which included CBC with normal white count stable hemoglobin, chemistries normal including normal blood sugar and normal renal function, lactic acid not elevated I ordered medication oral pain medication and antibiotics I ordered imaging studies which included right tib-fib and I independently     visualized and interpreted imaging which showed no bony destruction, no gas in tissue Additional history obtained from patient's wife Previous records obtained and reviewed in epic, no recent admissions  After the interventions stated above, I reevaluated the patient and found patient to be fairly asymptomatic other than pain.  Further discussion with his wife he had a similar ulceration that improved with antibiotics.  On review of prior records appears this was Keflex.  I think he deserves a trial of oral antibiotics again but will need close follow-up.  I put in an order for face-to-face so we can get home health to keep an eye on this and make sure it is improving.  He likely would benefit from outpatient follow-up with vascular surgery along with wound care.   Final Clinical Impression(s) / ED Diagnoses Final diagnoses:  Ulcer of right lower extremity with fat layer exposed [facility])    Rx / DC Orders ED Discharge Orders         Ordered    Home Health        07/07/20 2129    Face-to-face encounter (required for Medicare/Medicaid patients)       Comments: I Terrilee Files certify that this patient is under my care and that I, or a nurse practitioner or physician's assistant working with me, had a face-to-face encounter that meets the physician face-to-face encounter requirements with this patient on 07/07/2020. The encounter with the patient was in whole, or in part for the following medical condition(s) which is the primary reason for home health care (List medical condition): cva, leg edema, leg ulcer   07/07/20 2129    cephALEXin (KEFLEX) 500 MG capsule  4 times daily        07/07/20 2131    HYDROcodone-acetaminophen (NORCO/VICODIN) 5-325 MG tablet  Every 6 hours PRN        07/07/20 2131           Terrilee Files, MD 07/08/20 1202

## 2020-07-07 NOTE — ED Notes (Addendum)
Chief Complaint  Patient presents with  . Wound Check    chronic wound, per pt family it has gotten worse, hasnt followed up with PCP    Per family wound has been present since August 2021. Patient has hx of stroke, R side deficients. Wound on R posterior calf.   Patient A0x4, WC at baseline.   Rates pain 10/10.   Wife at bedside.

## 2020-07-07 NOTE — Progress Notes (Signed)
Carelink Summary Report / Loop Recorder 

## 2020-07-09 ENCOUNTER — Telehealth: Payer: Self-pay

## 2020-07-09 DIAGNOSIS — L89899 Pressure ulcer of other site, unspecified stage: Secondary | ICD-10-CM

## 2020-07-09 NOTE — Telephone Encounter (Signed)
See below

## 2020-07-09 NOTE — Addendum Note (Signed)
Addended by: Lieutenant Diego A on: 07/09/2020 12:54 PM   Modules accepted: Orders

## 2020-07-09 NOTE — Telephone Encounter (Signed)
I am fine with writing orders for home health wound care today. Please place any necessary orders.  Katina Degree. [name], MD 07/09/2020 12:21 PM

## 2020-07-09 NOTE — Addendum Note (Signed)
Addended by: Lieutenant Diego A on: 07/09/2020 03:36 PM   Modules accepted: Orders

## 2020-07-09 NOTE — Telephone Encounter (Signed)
Called patient's wife today, and she said she is fine with wound care but wife does express that it is really bad wound and wanted [place] to be aware.

## 2020-07-09 NOTE — Telephone Encounter (Signed)
I will not be here at 10 40 PM but I am open to seeing patient if needed at a time I am here.

## 2020-07-09 NOTE — Telephone Encounter (Signed)
Patient's wife called in stating that Steven Kent was seen in the ED yesterday for an ulcer of the lower left leg, wife is requesting home health for wound care. Dr.Parker is completely full could we work patientin tomorrow with Dr.Hunter at 10:40 pm.

## 2020-07-15 NOTE — Telephone Encounter (Signed)
Pt wife called following up on message from last week. Wife states she has not heard anything on someone coming out to treat patient for wound care. Wife states pt has finished his antibiotic and is soon to finish his other medication for the wound. Please advise.

## 2020-07-16 NOTE — Addendum Note (Signed)
Addended by: Lieutenant Diego A on: 07/16/2020 10:16 AM   Modules accepted: Orders

## 2020-07-16 NOTE — Telephone Encounter (Signed)
Referral has been placed for wound care.

## 2020-07-29 ENCOUNTER — Telehealth: Payer: Self-pay

## 2020-07-29 NOTE — Telephone Encounter (Signed)
Pt.'s wife called in stating they have not heard from the wound care specialist. Pt.'s leg is getting worse and skin is falling off, per wife.

## 2020-08-03 ENCOUNTER — Ambulatory Visit (INDEPENDENT_AMBULATORY_CARE_PROVIDER_SITE_OTHER): Payer: Medicare Other

## 2020-08-03 DIAGNOSIS — R55 Syncope and collapse: Secondary | ICD-10-CM | POA: Diagnosis not present

## 2020-08-03 LAB — CUP PACEART REMOTE DEVICE CHECK
Date Time Interrogation Session: 20220102082932
Implantable Pulse Generator Implant Date: 20211027

## 2020-08-03 NOTE — Telephone Encounter (Signed)
Sent another message to [place] with Aldine Contes he is the who schedules these

## 2020-08-03 NOTE — Telephone Encounter (Signed)
Can you look into this please

## 2020-08-05 NOTE — Telephone Encounter (Signed)
Im not familiar with this. Whats Dougs full name? Is he in Epic?

## 2020-08-05 NOTE — Telephone Encounter (Signed)
Hi Keba, It's Caren Griffins and I communicate with him through [employer]. As of yesterday this is where we are with this patient: We are out of network. I sent it to [facility] and [facility]. Will let you know asap. Thanks-Doug   I will get a message when the patient is scheduled

## 2020-08-06 NOTE — Telephone Encounter (Signed)
Noted  

## 2020-08-10 ENCOUNTER — Telehealth: Payer: Self-pay

## 2020-08-10 NOTE — Telephone Encounter (Signed)
Called and lm for [employer].

## 2020-08-10 NOTE — Telephone Encounter (Signed)
Crystal from [facility] home health called requesting verbal orders for skilled nursing 2 x 8 weeks for wound care. They are requesting to aqua cell the wound. Please advise.

## 2020-08-18 NOTE — Progress Notes (Signed)
Carelink Summary Report / Loop Recorder 

## 2020-09-03 ENCOUNTER — Ambulatory Visit (INDEPENDENT_AMBULATORY_CARE_PROVIDER_SITE_OTHER): Payer: Medicare Other

## 2020-09-03 DIAGNOSIS — R55 Syncope and collapse: Secondary | ICD-10-CM

## 2020-09-04 LAB — CUP PACEART REMOTE DEVICE CHECK
Date Time Interrogation Session: 20220204082616
Implantable Pulse Generator Implant Date: 20211027

## 2020-09-06 ENCOUNTER — Other Ambulatory Visit: Payer: Self-pay | Admitting: Family Medicine

## 2020-09-09 NOTE — Progress Notes (Signed)
Carelink Summary Report / Loop Recorder 

## 2020-09-11 ENCOUNTER — Other Ambulatory Visit: Payer: Self-pay

## 2020-09-11 ENCOUNTER — Encounter: Payer: Self-pay | Admitting: Podiatry

## 2020-09-11 ENCOUNTER — Ambulatory Visit (INDEPENDENT_AMBULATORY_CARE_PROVIDER_SITE_OTHER): Payer: Medicare Other | Admitting: Podiatry

## 2020-09-11 DIAGNOSIS — D689 Coagulation defect, unspecified: Secondary | ICD-10-CM

## 2020-09-11 DIAGNOSIS — M79674 Pain in right toe(s): Secondary | ICD-10-CM

## 2020-09-11 DIAGNOSIS — R600 Localized edema: Secondary | ICD-10-CM

## 2020-09-11 DIAGNOSIS — M79675 Pain in left toe(s): Secondary | ICD-10-CM | POA: Diagnosis not present

## 2020-09-11 DIAGNOSIS — B351 Tinea unguium: Secondary | ICD-10-CM

## 2020-09-11 DIAGNOSIS — G629 Polyneuropathy, unspecified: Secondary | ICD-10-CM

## 2020-09-11 NOTE — Progress Notes (Signed)
This patient returns to my office for at risk foot care.  This patient requires this care by a professional since this patient will be at risk due to having kidney disease,  CVA and neuropathy.  This patient is unable to cut nails himself since the patient cannot reach his nails.These nails are painful walking and wearing shoes.  This patient presents for at risk foot care today.  Patient is under care for leg wound right leg.  General Appearance  Alert, conversant and in no acute stress.  Vascular  Dorsalis pedis and posterior tibial  pulses are not   palpable  Both feet due to swelling...  Capillary return is within normal limits  bilaterally. Temperature and swelling is increased right leg/ foot.  Neurologic  Deferred . Muscle power within normal limits bilaterally.  Nails Thick disfigured discolored nails with subungual debris  from hallux to fifth toes bilaterally. No evidence of bacterial infection or drainage bilaterally.  Orthopedic  No limitations of motion  feet .  No crepitus or effusions noted.  No bony pathology or digital deformities noted.  Skin  normotropic skin with no porokeratosis noted bilaterally.  No signs of infections or ulcers noted.   There is severe swelling with increased temperature right foot/legs.  Onychomycosis  Pain in right toes  Pain in left toes  Swelling right leg/foot.  Consent was obtained for treatment procedures.   Mechanical debridement of nails 1-5  bilaterally performed with a nail nipper.  Filed with dremel without incident.     Patient is presently taking Xarelto tablets.   Return office visit   3 months                    Told patient to return for periodic foot care due to possible  at risk complications.   Helane Gunther DPM

## 2020-09-13 ENCOUNTER — Other Ambulatory Visit: Payer: Self-pay | Admitting: Family Medicine

## 2020-10-02 ENCOUNTER — Telehealth: Payer: Self-pay

## 2020-10-02 NOTE — Telephone Encounter (Signed)
Please advise 

## 2020-10-02 NOTE — Telephone Encounter (Signed)
[facility] is requesting verbal orders for   Right leg - where sock has been around ankle with incontinent is now blistered Blister - 15.0 x3.5 right ankle  +3 +5 right leg edemna  Requesting Wrapping in ace wrap to reduce edema, leg is to large for compression stocking  Wound on upper inner thigh right leg, stage 2  Requesting dme for bed and mattress    Request Physical therapy and Occupational Therapy Eval  Requesting Home Health Aide   Requesting Condom Cath Foley, due to incontinence     [phone] Dike ( LPN)   Fax: [phone]

## 2020-10-03 NOTE — Telephone Encounter (Signed)
LVM to return call.

## 2020-10-03 NOTE — Telephone Encounter (Signed)
Ok with all those orders.  Steven Kent. [name], MD 10/03/2020 10:59 AM

## 2020-10-05 ENCOUNTER — Ambulatory Visit (INDEPENDENT_AMBULATORY_CARE_PROVIDER_SITE_OTHER): Payer: Medicare Other

## 2020-10-05 DIAGNOSIS — R55 Syncope and collapse: Secondary | ICD-10-CM

## 2020-10-08 LAB — CUP PACEART REMOTE DEVICE CHECK
Date Time Interrogation Session: 20220309082937
Implantable Pulse Generator Implant Date: 20211027

## 2020-10-09 NOTE — Telephone Encounter (Signed)
Jennifer from [facility] called following up on these verbal orders. Please return call.

## 2020-10-09 NOTE — Telephone Encounter (Signed)
Spoke with Victorino Dike at [employer] DME order

## 2020-10-09 NOTE — Telephone Encounter (Signed)
Notified Victorino Dike ok for verbal orders. Patient needs DME order for hospital bed and high back wheel chair

## 2020-10-13 ENCOUNTER — Other Ambulatory Visit: Payer: Self-pay

## 2020-10-13 DIAGNOSIS — I69351 Hemiplegia and hemiparesis following cerebral infarction affecting right dominant side: Secondary | ICD-10-CM

## 2020-10-13 DIAGNOSIS — L89899 Pressure ulcer of other site, unspecified stage: Secondary | ICD-10-CM

## 2020-10-13 NOTE — Telephone Encounter (Signed)
Rx sent 

## 2020-10-14 NOTE — Progress Notes (Signed)
Carelink Summary Report / Loop Recorder 

## 2020-10-28 ENCOUNTER — Telehealth: Payer: Self-pay

## 2020-10-28 NOTE — Telephone Encounter (Signed)
[facility] is missing information for the order they received.   - face to face notes - speaking to the need of the items -bed narrative -notes to support why pt needs Gel overlay for the mattress   Fax [phone]

## 2020-10-29 NOTE — Telephone Encounter (Signed)
See below

## 2020-10-30 ENCOUNTER — Other Ambulatory Visit: Payer: Self-pay

## 2020-10-30 ENCOUNTER — Telehealth: Payer: Self-pay

## 2020-10-30 ENCOUNTER — Ambulatory Visit (INDEPENDENT_AMBULATORY_CARE_PROVIDER_SITE_OTHER): Payer: Medicare Other | Admitting: Physician Assistant

## 2020-10-30 ENCOUNTER — Ambulatory Visit (HOSPITAL_COMMUNITY)
Admission: RE | Admit: 2020-10-30 | Discharge: 2020-10-30 | Disposition: A | Discharge status: Home / Self Care | Payer: Medicare Other | Source: Ambulatory Visit | Attending: Vascular Surgery | Admitting: Vascular Surgery

## 2020-10-30 VITALS — BP 112/63 | HR 63 | Temp 98.2°F | Resp 20 | Ht 73.5 in

## 2020-10-30 DIAGNOSIS — I714 Abdominal aortic aneurysm, without rupture, unspecified: Secondary | ICD-10-CM

## 2020-10-30 NOTE — Telephone Encounter (Signed)
Holland Falling is calling from [facility] stating that they received an order for some items, but they are needing the office notes from the patient's last visit. Fax number is [phone].

## 2020-10-30 NOTE — Progress Notes (Signed)
[facility] [place] HISTORY AND PHYSICAL   History of Present Illness:  Patient is a 86 y.o. year old male who presents for evaluation of asymptomatic abdominal aortic aneurysm.  The aneurysm is currently 4.7cm in diameter by US/CT performed at [facility] on 10/29/19.  The patient denies abdominal pain.   He has had history of stroke with right side weakness.  He does not ambulate much and his primary mode of transportation is a WC.    He has chronic edema in B LE.  He refuses to elevate and will not wear compression socks.  He is followed for right LE lower leg wound care as an out patient.     Past Medical History:  Diagnosis Date  . Abdominal aortic aneurysm (AAA) (HCC)   . Hypertension   . Stroke [facility])     Past Surgical History:  Procedure Laterality Date  . CATARACT EXTRACTION, BILATERAL     rt eye 10/09/2014 lt eye 11/06/2014  . DENTAL SURGERY    . REFRACTIVE SURGERY  01/2015     Social History Social History   Tobacco Use  . Smoking status: Former Games developer  . Smokeless tobacco: Never Used  Substance Use Topics  . Alcohol use: Never  . Drug use: Never    Family History Family History  Problem Relation Age of Onset  . Prostate cancer Neg Hx     Allergies  Allergies  Allergen Reactions  . Aspirin Other (See Comments)    Full Strength Asprin-325 mg causes upset stomach. Can take 81 mg.     Current Outpatient Medications  Medication Sig Dispense Refill  . amLODipine (NORVASC) 2.5 MG tablet Take 2.5 mg by mouth daily.    Marland Kitchen atorvastatin (LIPITOR) 40 MG tablet TAKE 1 TABLET(40 MG) BY MOUTH EVERY EVENING 90 tablet 3  . brimonidine (ALPHAGAN) 0.15 % ophthalmic solution Place 1 drop into both eyes daily.    . cephALEXin (KEFLEX) 500 MG capsule Take 1 capsule (500 mg total) by mouth 4 (four) times daily. 28 capsule 0  . dorzolamide (TRUSOPT) 2 % ophthalmic solution Place 1 drop into both eyes 2 (two) times daily.    Marland Kitchen gabapentin (NEURONTIN) 400 MG capsule  TAKE 1 CAPSULE(400 MG) BY MOUTH TWICE DAILY 180 capsule 3  . HYDROcodone-acetaminophen (NORCO/VICODIN) 5-325 MG tablet Take 1-2 tablets by mouth every 6 (six) hours as needed for severe pain. 10 tablet 0  . latanoprost (XALATAN) 0.005 % ophthalmic solution Place 1 drop into both eyes at bedtime.    Marland Kitchen oxybutynin (DITROPAN) 5 MG tablet Take 1 tablet (5 mg total) by mouth 2 (two) times daily. 180 tablet 3  . torsemide (DEMADEX) 10 MG tablet Take by mouth.    . Vitamin D, Ergocalciferol, (DRISDOL) 1.25 MG (50000 UNIT) CAPS capsule TAKE 1 CAPSULE BY MOUTH EVERY 7 DAYS 12 capsule 3  . XARELTO 20 MG TABS tablet TAKE 1 TABLET(20 MG) BY MOUTH EVERY EVENING 90 tablet 3   No current facility-administered medications for this visit.    ROS:   General:  No weight loss, Fever, chills  HEENT: No recent headaches, no nasal bleeding, no visual changes, no sore throat  Neurologic: No dizziness, blackouts, seizures. No recent symptoms of stroke or mini- stroke. No recent episodes of slurred speech, or temporary blindness.  Cardiac: No recent episodes of chest pain/pressure, no shortness of breath at rest.  No shortness of breath with exertion.  Denies history of atrial fibrillation or irregular heartbeat  Vascular: No  history of rest pain in feet.  No history of claudication.  No history of non-healing ulcer, No history of DVT   Pulmonary: No home oxygen, no productive cough, no hemoptysis,  No asthma or wheezing  Musculoskeletal:  [ ]  Arthritis, [ ]  Low back pain,  [ ]  Joint pain  Hematologic:No history of hypercoagulable state.  No history of easy bleeding.  No history of anemia  Gastrointestinal: No hematochezia or melena,  No gastroesophageal reflux, no trouble swallowing  Urinary: [ ]  chronic Kidney disease, [ ]  on HD - [ ]  MWF or [ ]  TTHS, [ ]  Burning with urination, [ ]  Frequent urination, [ ]  Difficulty urinating;   Skin: No rashes  Psychological: No history of anxiety,  No history of  depression   Physical Examination  Vitals:   10/30/20 1025  BP: 112/63  Pulse: 63  Resp: 20  Temp: 98.2 F (36.8 C)  TempSrc: Temporal  SpO2: 100%  Height: 6' 1.5" (1.867 m)    Body mass index is 24.99 kg/m.  General:  Alert and oriented, no acute distress HEENT: Normal Neck: No bruit or JVD Pulmonary: Clear to auscultation bilaterally Cardiac: Regular Rate and Rhythm without murmur Gastrointestinal: Soft, non-tender, non-distended, no mass, no scars Skin: No rash Extremity Pulses:  2+ radial, brachial, non palpable femoral, dorsalis pedis, posterior tibial pulses bilaterally.  Feet are warm with intact motor.   Musculoskeletal: No deformity or edema  Neurologic: Right Upper and lower extremity with minimal motor s/p stroke in 2014  DATA:     Abdominal Aorta Findings:  +-----------+-------+----------+----------+----------+--------+--------+  Location  AP (cm)Trans (cm)PSV (cm/s)Waveform ThrombusComments  +-----------+-------+----------+----------+----------+--------+--------+  Proximal  2.24  2.28   203                   +-----------+-------+----------+----------+----------+--------+--------+  Mid    3.02  3.16   66                   +-----------+-------+----------+----------+----------+--------+--------+  Distal   4.46  5.01   81                   +-----------+-------+----------+----------+----------+--------+--------+  RT CIA Prox1.6  1.5    91    monophasic          +-----------+-------+----------+----------+----------+--------+--------+  LT CIA Mid 1.4  1.4    161    monophasic          +-----------+-------+----------+----------+----------+--------+--------+   Visualization of the Proximal Abdominal Aorta and Left CIA Proximal artery  was limited.    Summary:  Abdominal Aorta: There is evidence of abnormal  dilatation of the mid and  distal Abdominal aorta. Previous size on 10/29/19: 4.54 x 4.73 cm.     ASSESSMENT:  Asymptomatic AAA The AAA has increased in size since his ultrasound 1 year ago from 4.7 to 5 cm.  He denise abdominal pain and lumbar pain.  I will bring him back in 6 months to see Dr. .  He was originally followed by Dr. .    If the AAA size is above 5.0 in diameter he will likely need a CTA to determine if he needs intervention.  If he develops sudden abdominal pain or lumbar pain he will call 911.  His wife was with him today during his exam and visit.   Cont. Statin daily and Xarelto.  He has an allergy  to ASA.      [facility] [place] Office: [phone] Pager: [phone]

## 2020-11-03 ENCOUNTER — Telehealth: Payer: Self-pay

## 2020-11-03 ENCOUNTER — Other Ambulatory Visit: Payer: Self-pay

## 2020-11-03 DIAGNOSIS — I714 Abdominal aortic aneurysm, without rupture, unspecified: Secondary | ICD-10-CM

## 2020-11-03 NOTE — Telephone Encounter (Signed)
Notes faxed.

## 2020-11-03 NOTE — Telephone Encounter (Signed)
[facility] is missing narrative for wheelchair, bed,and gel overlay for the mattress.    [facility] states this is their third and final attempt. If not received by Thursday they have to cancel on their end.   Fax [phone]  Phone [phone]

## 2020-11-04 NOTE — Telephone Encounter (Signed)
Ok to write letter stating Patient need bed and gel mattress due to bed sores

## 2020-11-05 ENCOUNTER — Ambulatory Visit (INDEPENDENT_AMBULATORY_CARE_PROVIDER_SITE_OTHER): Payer: Medicare Other

## 2020-11-05 ENCOUNTER — Encounter: Payer: Self-pay | Admitting: Family Medicine

## 2020-11-05 DIAGNOSIS — R55 Syncope and collapse: Secondary | ICD-10-CM | POA: Diagnosis not present

## 2020-11-10 LAB — CUP PACEART REMOTE DEVICE CHECK
Date Time Interrogation Session: 20220411082819
Implantable Pulse Generator Implant Date: 20211027

## 2020-11-11 ENCOUNTER — Other Ambulatory Visit: Payer: Self-pay | Admitting: Family Medicine

## 2020-11-18 NOTE — Progress Notes (Signed)
Carelink Summary Report / Loop Recorder 

## 2020-11-29 ENCOUNTER — Other Ambulatory Visit: Payer: Self-pay | Admitting: Internal Medicine

## 2020-12-02 ENCOUNTER — Encounter: Payer: Self-pay | Admitting: Family Medicine

## 2020-12-02 ENCOUNTER — Ambulatory Visit (INDEPENDENT_AMBULATORY_CARE_PROVIDER_SITE_OTHER): Payer: Medicare Other | Admitting: Family Medicine

## 2020-12-02 ENCOUNTER — Other Ambulatory Visit: Payer: Self-pay

## 2020-12-02 VITALS — BP 120/62 | HR 91 | Temp 98.7°F | Ht 73.5 in | Wt 196.0 lb

## 2020-12-02 DIAGNOSIS — L97909 Non-pressure chronic ulcer of unspecified part of unspecified lower leg with unspecified severity: Secondary | ICD-10-CM

## 2020-12-02 DIAGNOSIS — I83009 Varicose veins of unspecified lower extremity with ulcer of unspecified site: Secondary | ICD-10-CM | POA: Diagnosis not present

## 2020-12-02 DIAGNOSIS — R600 Localized edema: Secondary | ICD-10-CM

## 2020-12-02 NOTE — Patient Instructions (Addendum)
It was very nice to see you today!  He needs to see a wound care specialist.  I will place a referral for him to see the plastic surgeon.  We will probably need to assess the blood flow in his legs as well.  You can call Dr. Bosie Helper office to schedule appointment to have blood flow assessed.  I will also see if we can get the wound care nurse to come out once daily instead of twice weekly as they are currently doing.  I do not see any signs of infection today.  Please keep the bandage in place and keep the area clean and dry.  Take care, Dr [name]  PLEASE NOTE:  If you had any lab tests please let us know if you have not heard back within a few days. You may see your results on mychart before we have a chance to review them but we will give you a call once they are reviewed by [place]. If we ordered any referrals today, please let us know if you have not heard from their office within the next week.   Please try these tips to maintain a healthy lifestyle:   Eat at least 3 REAL meals and 1-2 snacks per day.  Aim for no more than 5 hours between eating.  If you eat breakfast, please do so within one hour of getting up.    Each meal should contain half fruits/vegetables, one quarter protein, and one quarter carbs (no bigger than a computer mouse)   Cut down on sweet beverages. This includes juice, soda, and sweet tea.     Drink at least 1 glass of water with each meal and aim for at least 8 glasses per day   Exercise at least 150 minutes every week.

## 2020-12-02 NOTE — Progress Notes (Signed)
   Steven Kent is a 86 y.o. male who presents today for an office visit.  Assessment/Plan:  New/Acute Problems: Venous stasis ulcer Patient has been working with home health nursing with wound care for the last several months has not made significant progress.  He has significant edema to bilateral lower extremities which is likely impairing his healing ability.  Given his lack of improvement over the last several months will place referral to plastic surgery for further management.  May benefit from [facility] boot placement though would likely need to assess arterial flow first.  They already have vascular surgeon - advised them to call to schedule an appointment ASAP.   Chronic Problems Addressed Today: Leg edema Contributing to poor wound healing.  May benefit from [facility] boot placement both will need vascular assessment first.     Subjective:  HPI:  Patient here with concern for new ulcers on his lower extremities.  He has last seen here about 6 months ago for similar checkup.  Since our last visit he was seen in the ED for ulcer about 5 months ago.  Started on Keflex.  Symptoms improved but then began to worsen.  He has been seen       Objective:  Physical Exam: BP 120/62   Pulse 91   Temp 98.7 F (37.1 C) (Temporal)   Ht 6' 1.5" (1.867 m)   Wt 196 lb (88.9 kg)   SpO2 99%   BMI 25.51 kg/m   Gen: No acute distress, resting comfortably Skin: Approximately 10 x 20 cm open stasis ulcer on posterior right lower leg.  Granulation tissue present.  No surrounding erythema. Neuro: Grossly normal, moves all extremities Psych: Normal affect and thought content      Time Spent: 45 minutes of total time was spent on the date of the encounter performing the following actions: chart review prior to seeing the patient including recent ED visit and visits with specialists, obtaining history, performing a medically necessary exam, bandaging his wound in the office today, counseling on  the treatment plan, placing orders, and documenting in our EHR.        Katina Degree. [name], MD 12/02/2020 12:01 PM

## 2020-12-02 NOTE — Assessment & Plan Note (Signed)
Contributing to poor wound healing.  May benefit from [facility] boot placement both will need vascular assessment first.

## 2020-12-07 ENCOUNTER — Ambulatory Visit (INDEPENDENT_AMBULATORY_CARE_PROVIDER_SITE_OTHER): Payer: Medicare Other

## 2020-12-07 DIAGNOSIS — R55 Syncope and collapse: Secondary | ICD-10-CM | POA: Diagnosis not present

## 2020-12-10 ENCOUNTER — Emergency Department (HOSPITAL_COMMUNITY): Payer: Medicare Other

## 2020-12-10 ENCOUNTER — Inpatient Hospital Stay (HOSPITAL_COMMUNITY)
Admission: EM | Admit: 2020-12-10 | Discharge: 2020-12-18 | DRG: 602 | Disposition: A | Discharge status: Skilled Nursing Facility | Payer: Medicare Other | Attending: Family Medicine | Admitting: Family Medicine

## 2020-12-10 DIAGNOSIS — I10 Essential (primary) hypertension: Secondary | ICD-10-CM | POA: Diagnosis present

## 2020-12-10 DIAGNOSIS — L089 Local infection of the skin and subcutaneous tissue, unspecified: Secondary | ICD-10-CM

## 2020-12-10 DIAGNOSIS — R400 Somnolence: Secondary | ICD-10-CM | POA: Diagnosis not present

## 2020-12-10 DIAGNOSIS — R4182 Altered mental status, unspecified: Secondary | ICD-10-CM

## 2020-12-10 DIAGNOSIS — G9341 Metabolic encephalopathy: Secondary | ICD-10-CM | POA: Diagnosis present

## 2020-12-10 DIAGNOSIS — I69351 Hemiplegia and hemiparesis following cerebral infarction affecting right dominant side: Secondary | ICD-10-CM

## 2020-12-10 DIAGNOSIS — I714 Abdominal aortic aneurysm, without rupture: Secondary | ICD-10-CM | POA: Diagnosis present

## 2020-12-10 DIAGNOSIS — R600 Localized edema: Secondary | ICD-10-CM | POA: Diagnosis present

## 2020-12-10 DIAGNOSIS — T502X5A Adverse effect of carbonic-anhydrase inhibitors, benzothiadiazides and other diuretics, initial encounter: Secondary | ICD-10-CM | POA: Diagnosis not present

## 2020-12-10 DIAGNOSIS — Z87891 Personal history of nicotine dependence: Secondary | ICD-10-CM

## 2020-12-10 DIAGNOSIS — L03115 Cellulitis of right lower limb: Secondary | ICD-10-CM | POA: Diagnosis not present

## 2020-12-10 DIAGNOSIS — I130 Hypertensive heart and chronic kidney disease with heart failure and stage 1 through stage 4 chronic kidney disease, or unspecified chronic kidney disease: Secondary | ICD-10-CM | POA: Diagnosis present

## 2020-12-10 DIAGNOSIS — L8990 Pressure ulcer of unspecified site, unspecified stage: Secondary | ICD-10-CM | POA: Insufficient documentation

## 2020-12-10 DIAGNOSIS — R0989 Other specified symptoms and signs involving the circulatory and respiratory systems: Secondary | ICD-10-CM

## 2020-12-10 DIAGNOSIS — E785 Hyperlipidemia, unspecified: Secondary | ICD-10-CM | POA: Diagnosis present

## 2020-12-10 DIAGNOSIS — E876 Hypokalemia: Secondary | ICD-10-CM | POA: Diagnosis not present

## 2020-12-10 DIAGNOSIS — R748 Abnormal levels of other serum enzymes: Secondary | ICD-10-CM

## 2020-12-10 DIAGNOSIS — R443 Hallucinations, unspecified: Secondary | ICD-10-CM | POA: Diagnosis present

## 2020-12-10 DIAGNOSIS — M7989 Other specified soft tissue disorders: Secondary | ICD-10-CM | POA: Diagnosis present

## 2020-12-10 DIAGNOSIS — I5032 Chronic diastolic (congestive) heart failure: Secondary | ICD-10-CM | POA: Diagnosis present

## 2020-12-10 DIAGNOSIS — Z7901 Long term (current) use of anticoagulants: Secondary | ICD-10-CM

## 2020-12-10 DIAGNOSIS — E872 Acidosis, unspecified: Secondary | ICD-10-CM

## 2020-12-10 DIAGNOSIS — Z79899 Other long term (current) drug therapy: Secondary | ICD-10-CM

## 2020-12-10 DIAGNOSIS — Z886 Allergy status to analgesic agent status: Secondary | ICD-10-CM

## 2020-12-10 DIAGNOSIS — L97219 Non-pressure chronic ulcer of right calf with unspecified severity: Secondary | ICD-10-CM | POA: Diagnosis present

## 2020-12-10 DIAGNOSIS — Z20822 Contact with and (suspected) exposure to covid-19: Secondary | ICD-10-CM | POA: Diagnosis present

## 2020-12-10 DIAGNOSIS — M6282 Rhabdomyolysis: Secondary | ICD-10-CM

## 2020-12-10 DIAGNOSIS — N1831 Chronic kidney disease, stage 3a: Secondary | ICD-10-CM | POA: Diagnosis present

## 2020-12-10 DIAGNOSIS — I2510 Atherosclerotic heart disease of native coronary artery without angina pectoris: Secondary | ICD-10-CM | POA: Diagnosis present

## 2020-12-10 LAB — CBC WITH DIFFERENTIAL/PLATELET
Abs Immature Granulocytes: 0.02 10*3/uL (ref 0.00–0.07)
Basophils Absolute: 0 10*3/uL (ref 0.0–0.1)
Basophils Relative: 0 %
Eosinophils Absolute: 0.1 10*3/uL (ref 0.0–0.5)
Eosinophils Relative: 1 %
HCT: 45.2 % (ref 39.0–52.0)
Hemoglobin: 13.8 g/dL (ref 13.0–17.0)
Immature Granulocytes: 0 %
Lymphocytes Relative: 15 %
Lymphs Abs: 1.1 10*3/uL (ref 0.7–4.0)
MCH: 31.7 pg (ref 26.0–34.0)
MCHC: 30.5 g/dL (ref 30.0–36.0)
MCV: 103.7 fL — ABNORMAL HIGH (ref 80.0–100.0)
Monocytes Absolute: 0.8 10*3/uL (ref 0.1–1.0)
Monocytes Relative: 11 %
Neutro Abs: 5.2 10*3/uL (ref 1.7–7.7)
Neutrophils Relative %: 73 %
Platelets: 168 10*3/uL (ref 150–400)
RBC: 4.36 MIL/uL (ref 4.22–5.81)
RDW: 14.7 % (ref 11.5–15.5)
WBC: 7.2 10*3/uL (ref 4.0–10.5)
nRBC: 0 % (ref 0.0–0.2)

## 2020-12-10 LAB — URINALYSIS, ROUTINE W REFLEX MICROSCOPIC
Bacteria, UA: NONE SEEN
Bilirubin Urine: NEGATIVE
Glucose, UA: NEGATIVE mg/dL
Ketones, ur: NEGATIVE mg/dL
Leukocytes,Ua: NEGATIVE
Nitrite: NEGATIVE
Protein, ur: NEGATIVE mg/dL
Specific Gravity, Urine: 1.01 (ref 1.005–1.030)
pH: 5 (ref 5.0–8.0)

## 2020-12-10 LAB — COMPREHENSIVE METABOLIC PANEL
ALT: 17 U/L (ref 0–44)
AST: 46 U/L — ABNORMAL HIGH (ref 15–41)
Albumin: 3.1 g/dL — ABNORMAL LOW (ref 3.5–5.0)
Alkaline Phosphatase: 58 U/L (ref 38–126)
Anion gap: 10 (ref 5–15)
BUN: 22 mg/dL (ref 8–23)
CO2: 23 mmol/L (ref 22–32)
Calcium: 8.5 mg/dL — ABNORMAL LOW (ref 8.9–10.3)
Chloride: 104 mmol/L (ref 98–111)
Creatinine, Ser: 1.41 mg/dL — ABNORMAL HIGH (ref 0.61–1.24)
GFR, Estimated: 49 mL/min — ABNORMAL LOW (ref >60)
Glucose, Bld: 110 mg/dL — ABNORMAL HIGH (ref 70–99)
Potassium: 4.1 mmol/L (ref 3.5–5.1)
Sodium: 137 mmol/L (ref 135–145)
Total Bilirubin: 1.2 mg/dL (ref 0.3–1.2)
Total Protein: 7.2 g/dL (ref 6.5–8.1)

## 2020-12-10 LAB — LACTIC ACID, PLASMA
Lactic Acid, Venous: 2.5 mmol/L (ref 0.5–1.9)
Lactic Acid, Venous: 1.6 mmol/L (ref 0.5–1.9)

## 2020-12-10 LAB — CK: Total CK: 1104 U/L — ABNORMAL HIGH (ref 49–397)

## 2020-12-10 LAB — LIPASE, BLOOD: Lipase: 29 U/L (ref 11–51)

## 2020-12-10 MED ORDER — VANCOMYCIN HCL 1250 MG/250ML IV SOLN
1250.0000 mg | INTRAVENOUS | Status: DC
  Administered 2020-12-11 – 2020-12-12 (×2): 1250 mg via INTRAVENOUS
  Filled 2020-12-10 (×2): qty 250

## 2020-12-10 MED ORDER — VANCOMYCIN HCL 1750 MG/350ML IV SOLN
1750.0000 mg | Freq: Once | INTRAVENOUS | Status: AC
  Administered 2020-12-10: 1750 mg via INTRAVENOUS
  Filled 2020-12-10: qty 350

## 2020-12-10 MED ORDER — VANCOMYCIN HCL 1000 MG/200ML IV SOLN: 1000.0000 mg | Freq: Once | INTRAVENOUS | Status: DC

## 2020-12-10 MED ORDER — SODIUM CHLORIDE 0.9 % IV BOLUS
500.0000 mL | Freq: Once | INTRAVENOUS | Status: AC
  Administered 2020-12-10: 500 mL via INTRAVENOUS

## 2020-12-10 MED ORDER — SODIUM CHLORIDE 0.9 % IV SOLN
2.0000 g | Freq: Once | INTRAVENOUS | Status: AC
  Administered 2020-12-10: 2 g via INTRAVENOUS
  Filled 2020-12-10: qty 20

## 2020-12-10 NOTE — ED Notes (Signed)
Pt refused blood draw

## 2020-12-10 NOTE — ED Provider Notes (Signed)
Emergency Department Provider Note   I have reviewed the triage vital signs and the nursing notes.   HISTORY  Chief Complaint Altered Mental Status and Foot Pain   HPI Steven Kent is a 86 y.o. male with past medical history reviewed below including chronic  lower extremity swelling and wounds especially on the right lower extremity presents to the emergency department by EMS after he has been unable to get out of his home wheelchair.  He apparently lives with his daughter but according to EMS he has not left the wheelchair in approximately 1 week.  They report that they were initially called to the scene to help get him out of the chair but when they saw the condition he was in a decided to transport to the emergency department.  They state that he was sitting in a diaper which was dirty and had soaked dressings of the right lower extremity with foul-smelling odor. Daughter did report to them that he was more confused than normal. He is managed by home health and wound care.   Level 5 caveat: confusion.    Past Medical History:  Diagnosis Date  . Abdominal aortic aneurysm (AAA) (HCC)   . Hypertension   . Stroke [facility])     Patient Active Problem List   Diagnosis Date Noted  . Essential hypertension 08/16/2018  . Hypertensive kidney disease with chronic kidney disease stage III 08/16/2018  . VTE (venous thromboembolism) 08/16/2018  . Peripheral neuropathy 08/16/2018  . Glaucoma 08/16/2018  . Onychomycosis 08/16/2018  . Leg edema 08/16/2018  . Hyperlipidemia 08/16/2018  . Overactive bladder 08/16/2018  . Coronary artery disease involving native heart without angina pectoris 08/16/2018  . Hemiparesis affecting right side as late effect of cerebrovascular accident (HCC)   . Abdominal aortic aneurysm (AAA) [facility])     Past Surgical History:  Procedure Laterality Date  . CATARACT EXTRACTION, BILATERAL     rt eye 10/09/2014 lt eye 11/06/2014  . DENTAL SURGERY    .  REFRACTIVE SURGERY  01/2015    Allergies Aspirin  Family History  Problem Relation Age of Onset  . Prostate cancer Neg Hx     Social History Social History   Tobacco Use  . Smoking status: Former Games developer  . Smokeless tobacco: Never Used  Substance Use Topics  . Alcohol use: Never  . Drug use: Never    Review of Systems  Level 5 caveat: Confusion   ____________________________________________   PHYSICAL EXAM:  VITAL SIGNS: Temp: 98.6 F BP: 124/85 Pulse: 77 Resp: 18 SpO2: 98% RA  Constitutional: Alert and talkative but confused.  Unclear mental status baseline as only the patient is present currently Eyes: Conjunctivae are normal.  Head: Atraumatic. Nose: No congestion/rhinnorhea. Mouth/Throat: Mucous membranes are dry.  Neck: No stridor.  Cardiovascular: Normal rate, regular rhythm. Good peripheral circulation. Grossly normal heart sounds.   Respiratory: Normal respiratory effort.  No retractions. Lungs CTAB. Gastrointestinal: Soft and nontender. No distention.  Musculoskeletal: Significant pitting edema in both lower extremities with wound as pictured below in the right lower extremity.  There is some overlying biofilm and darker areas which appear to be developing infection.  Neurologic:  Normal speech and language. Generalized weakness.  Skin:  Skin is warm, dry and intact. No rash noted.      ____________________________________________   LABS (all labs ordered are listed, but only abnormal results are displayed)  Labs Reviewed  COMPREHENSIVE METABOLIC PANEL - Abnormal; Notable for the following components:  Result Value   Glucose, Bld 110 (*)    Creatinine, Ser 1.41 (*)    Calcium 8.5 (*)    Albumin 3.1 (*)    AST 46 (*)    GFR, Estimated 49 (*)    All other components within normal limits  LACTIC ACID, PLASMA - Abnormal; Notable for the following components:   Lactic Acid, Venous 2.5 (*)    All other components within normal limits  CBC  WITH DIFFERENTIAL/PLATELET - Abnormal; Notable for the following components:   MCV 103.7 (*)    All other components within normal limits  CK - Abnormal; Notable for the following components:   Total CK 1,104 (*)    All other components within normal limits  URINALYSIS, ROUTINE W REFLEX MICROSCOPIC - Abnormal; Notable for the following components:   Hgb urine dipstick SMALL (*)    All other components within normal limits  URINE CULTURE  CULTURE, BLOOD (ROUTINE X 2)  CULTURE, BLOOD (ROUTINE X 2)  SARS CORONAVIRUS 2 (TAT 6-24 HRS)  LIPASE, BLOOD  LACTIC ACID, PLASMA   ____________________________________________  EKG  None  ____________________________________________  RADIOLOGY  DG Chest 1 View  Result Date: 12/10/2020 CLINICAL DATA:  Altered mental status. EXAM: CHEST  1 VIEW COMPARISON:  Radiograph 08/23/2019. FINDINGS: Implanted monitoring device projects over the left chest wall. Mild cardiomegaly. Aortic atherosclerosis tortuosity. Asymmetry of the hemi thoraces is felt to be due to patient rotation. Subsegmental atelectasis in the right mid lung. Mild vascular redistribution without pulmonary edema. No significant pleural effusion. No pneumothorax. Bones are diffusely under mineralized. IMPRESSION: 1. Mild cardiomegaly and vascular congestion. 2. Subsegmental atelectasis in the right mid lung. Electronically Signed   By: Narda Rutherford M.D.   On: 12/10/2020 19:42   DG Tibia/Fibula Right  Result Date: 12/10/2020 CLINICAL DATA:  Wound. EXAM: RIGHT TIBIA AND FIBULA - 2 VIEW COMPARISON:  None. FINDINGS: Divided lateral views of the lower extremity. AP views could not be obtained due to patient medical condition. The bones are diffusely under mineralized. No fracture, periosteal reaction, focal bone lesion or erosion. Knee osteoarthritis with a probable ossified intra-articular body posteriorly in a Baker cyst. Diffuse soft tissue edema. Vascular calcifications. IMPRESSION: 1.  Diffuse soft tissue edema. No acute osseous abnormality. 2. Knee osteoarthritis with probable ossified intra-articular body in a Baker cyst. 3. Technically limited exam due to difficulties with positioning. Electronically Signed   By: Narda Rutherford M.D.   On: 12/10/2020 19:44   CT Head Wo Contrast  Result Date: 12/10/2020 CLINICAL DATA:  Mental status change. EXAM: CT HEAD WITHOUT CONTRAST TECHNIQUE: Contiguous axial images were obtained from the base of the skull through the vertex without intravenous contrast. COMPARISON:  Head CT 11/17/2019, brain MRI 02/05/2020 FINDINGS: Brain: Normal for age atrophy. Advanced chronic small vessel ischemic change. Remote lacunar infarcts in the basal ganglia. No intracranial hemorrhage, mass effect, or midline shift. No hydrocephalus. The basilar cisterns are patent. No evidence of territorial infarct or acute ischemia. No extra-axial or intracranial fluid collection. Vascular: No hyperdense vessel. Skull: No fracture or focal lesion. Sinuses/Orbits: Abnormal appearance of the right globe with high density in the dependent aspect of the posterior chamber, slightly progressed from prior MRI. There is also thickening and high density involving the superior ophthalmic vein. Bilateral lens extraction. Paranasal sinuses and mastoid air cells are clear. Other: None. IMPRESSION: 1. No acute intracranial abnormality. 2. Advanced chronic small vessel ischemic change. 3. Abnormal appearance of the right globe with high density in  the dependent aspect of the posterior chamber, slightly progressed from prior MRI. There is thickening and high density involving the superior ophthalmic vein that may represent chronic thrombus. This could be further assessed with orbital MRI based on clinical concern. Electronically Signed   By: Narda Rutherford M.D.   On: 12/10/2020 19:36    ____________________________________________   PROCEDURES  Procedure(s) performed:    Procedures  CRITICAL CARE Performed by: Maia Plan Total critical care time: 35 minutes Critical care time was exclusive of separately billable procedures and treating other patients. Critical care was necessary to treat or prevent imminent or life-threatening deterioration. Critical care was time spent personally by me on the following activities: development of treatment plan with patient and/or surrogate as well as nursing, discussions with consultants, evaluation of patient's response to treatment, examination of patient, obtaining history from patient or surrogate, ordering and performing treatments and interventions, ordering and review of laboratory studies, ordering and review of radiographic studies, pulse oximetry and re-evaluation of patient's condition.  Alona Bene, MD Emergency Medicine  ____________________________________________   INITIAL IMPRESSION / ASSESSMENT AND PLAN / ED COURSE  Pertinent labs & imaging results that were available during my care of the patient were reviewed by me and considered in my medical decision making (see chart for details).   Patient presents to the emergency department with altered mental status after sitting in a wheelchair for over a week according to EMS.  Family not at bedside initially for discussion.  Patient is moving extremities equally and I do not appreciate any obvious focal neurologic deficit.  He does have an infected appearing wound down his right leg.  He is covered in stool on arrival and was cleaned by nursing staff.  He does have some skin tear to the right upper extremity but does not appear secondarily infected.   Patient with no leukocytosis but CK is elevated consistent with his history of sitting in a wheelchair for prolonged period of time.  He does have an elevated lactic acidosis which could be related to acute dehydration but kidney function is only slightly elevated from his baseline.  With a wound on the leg  which appears infected and elevated lactate I will start antibiotics in case this represents early sepsis.  Patient's vital signs are largely unremarkable.   Discussed patient's case with [facility] to request admission. Patient and family (if present) updated with plan. Care transferred to [facility] service.  I reviewed all nursing notes, vitals, pertinent old records, EKGs, labs, imaging (as available).  ____________________________________________  FINAL CLINICAL IMPRESSION(S) / ED DIAGNOSES  Final diagnoses:  Somnolence  Elevated CK  Wound infection     MEDICATIONS GIVEN DURING THIS VISIT:  Medications  vancomycin (VANCOREADY) IVPB 1250 mg/250 mL (has no administration in time range)  sodium chloride 0.9 % bolus 500 mL (0 mLs Intravenous Stopped 12/10/20 1915)  sodium chloride 0.9 % bolus 500 mL (0 mLs Intravenous Stopped 12/10/20 1947)  cefTRIAXone (ROCEPHIN) 2 g in sodium chloride 0.9 % 100 mL IVPB (0 g Intravenous Stopped 12/10/20 2050)  vancomycin (VANCOREADY) IVPB 1750 mg/350 mL (0 mg Intravenous Stopped 12/10/20 2225)    Note:  This document was prepared using Dragon voice recognition software and may include unintentional dictation errors.  Alona Bene, MD, [facility] Emergency Medicine    Florella Mcneese, Arlyss Repress, MD 12/11/20 [phone]

## 2020-12-10 NOTE — ED Notes (Signed)
This RN was finally able to convince patient to allow her to get repeat lactic acid.

## 2020-12-10 NOTE — Progress Notes (Signed)
Pharmacy Antibiotic Note  Steven Kent is a 86 y.o. male admitted on 12/10/2020 with cellulitis.  Pharmacy has been consulted for vancomycin dosing. Patient PMH significant for chronic lower extremity swelling and wounds on the right lower extremity. Patient presents to ED with complaints of unable to get out of wheelchair. Daughter reports patient is AMS at baseline but appears worse. EMS found patient in wheel chair with foul smelling wound dressing and soiled undergarments. Current WBC 7.2 and Scr 1.41. Patient is afebrile.   Plan: Vancomycin 1750 MG x1 followed by 1250 MG Q12H (AUC 469, Scr 1.41)  Follow up renal function, clinical status, and treatment plan  Vancomycin levels as indicated       Temp (24hrs), Avg:98.6 F (37 C), Min:98.6 F (37 C), Max:98.6 F (37 C)  Recent Labs  Lab 12/10/20 1727  WBC 7.2  CREATININE 1.41*  LATICACIDVEN 2.5*    Estimated Creatinine Clearance: 45.5 mL/min (A) (by C-G formula based on SCr of 1.41 mg/dL (H)).    Allergies  Allergen Reactions  . Aspirin Other (See Comments)    Full Strength Asprin-325 mg causes upset stomach. Can take 81 mg.    Antimicrobials this admission: 5/12 CTX x1  5/12 Vancomycin >>  Dose adjustments this admission: NA  Microbiology results: 5/12 BCx:  5/12 UCx:     Thank you for allowing pharmacy to be a part of this patient's care.  Isaias Sakai, PharmD, MBA Pharmacy Resident [phone] 12/10/2020 7:17 PM

## 2020-12-10 NOTE — ED Notes (Signed)
Patient refused to be stuck again for repeat lactic acid

## 2020-12-10 NOTE — ED Triage Notes (Signed)
Pt bibems from home.  Pt been in wheelchair for 1 week. Daughter called due to him being stuck in the same position and initially just wanted him moved out of wheelchair.  Pt also c/o R foot pain. Pt has wound dressing from upper leg to foot on R side. Pt has confusion at baseline but daughter states it is getting worse.

## 2020-12-11 ENCOUNTER — Encounter (HOSPITAL_COMMUNITY): Payer: Self-pay | Admitting: Internal Medicine

## 2020-12-11 DIAGNOSIS — I10 Essential (primary) hypertension: Secondary | ICD-10-CM | POA: Diagnosis not present

## 2020-12-11 DIAGNOSIS — E872 Acidosis, unspecified: Secondary | ICD-10-CM

## 2020-12-11 DIAGNOSIS — M6282 Rhabdomyolysis: Secondary | ICD-10-CM

## 2020-12-11 DIAGNOSIS — R0989 Other specified symptoms and signs involving the circulatory and respiratory systems: Secondary | ICD-10-CM

## 2020-12-11 DIAGNOSIS — E785 Hyperlipidemia, unspecified: Secondary | ICD-10-CM

## 2020-12-11 DIAGNOSIS — I69351 Hemiplegia and hemiparesis following cerebral infarction affecting right dominant side: Secondary | ICD-10-CM

## 2020-12-11 DIAGNOSIS — T148XXA Other injury of unspecified body region, initial encounter: Secondary | ICD-10-CM

## 2020-12-11 DIAGNOSIS — R748 Abnormal levels of other serum enzymes: Secondary | ICD-10-CM | POA: Diagnosis not present

## 2020-12-11 DIAGNOSIS — R600 Localized edema: Secondary | ICD-10-CM

## 2020-12-11 DIAGNOSIS — L089 Local infection of the skin and subcutaneous tissue, unspecified: Secondary | ICD-10-CM

## 2020-12-11 DIAGNOSIS — L03115 Cellulitis of right lower limb: Secondary | ICD-10-CM | POA: Diagnosis not present

## 2020-12-11 LAB — BASIC METABOLIC PANEL
Anion gap: 8 (ref 5–15)
BUN: 20 mg/dL (ref 8–23)
CO2: 26 mmol/L (ref 22–32)
Calcium: 8.1 mg/dL — ABNORMAL LOW (ref 8.9–10.3)
Chloride: 107 mmol/L (ref 98–111)
Creatinine, Ser: 1.27 mg/dL — ABNORMAL HIGH (ref 0.61–1.24)
GFR, Estimated: 56 mL/min — ABNORMAL LOW (ref >60)
Glucose, Bld: 92 mg/dL (ref 70–99)
Potassium: 3.8 mmol/L (ref 3.5–5.1)
Sodium: 141 mmol/L (ref 135–145)

## 2020-12-11 LAB — CBC WITH DIFFERENTIAL/PLATELET
Abs Immature Granulocytes: 0.01 10*3/uL (ref 0.00–0.07)
Basophils Absolute: 0 10*3/uL (ref 0.0–0.1)
Basophils Relative: 0 %
Eosinophils Absolute: 0.2 10*3/uL (ref 0.0–0.5)
Eosinophils Relative: 2 %
HCT: 39.4 % (ref 39.0–52.0)
Hemoglobin: 12.6 g/dL — ABNORMAL LOW (ref 13.0–17.0)
Immature Granulocytes: 0 %
Lymphocytes Relative: 16 %
Lymphs Abs: 1.2 10*3/uL (ref 0.7–4.0)
MCH: 31.5 pg (ref 26.0–34.0)
MCHC: 32 g/dL (ref 30.0–36.0)
MCV: 98.5 fL (ref 80.0–100.0)
Monocytes Absolute: 0.6 10*3/uL (ref 0.1–1.0)
Monocytes Relative: 9 %
Neutro Abs: 5.3 10*3/uL (ref 1.7–7.7)
Neutrophils Relative %: 73 %
Platelets: 163 10*3/uL (ref 150–400)
RBC: 4 MIL/uL — ABNORMAL LOW (ref 4.22–5.81)
RDW: 14.6 % (ref 11.5–15.5)
WBC: 7.2 10*3/uL (ref 4.0–10.5)
nRBC: 0 % (ref 0.0–0.2)

## 2020-12-11 LAB — SARS CORONAVIRUS 2 (TAT 6-24 HRS): SARS Coronavirus 2: NEGATIVE

## 2020-12-11 LAB — GLUCOSE, CAPILLARY: Glucose-Capillary: 101 mg/dL — ABNORMAL HIGH (ref 70–99)

## 2020-12-11 MED ORDER — SODIUM CHLORIDE 0.9 % IV SOLN
2.0000 g | Freq: Two times a day (BID) | INTRAVENOUS | Status: DC
  Administered 2020-12-11 – 2020-12-13 (×5): 2 g via INTRAVENOUS
  Filled 2020-12-11 (×5): qty 2

## 2020-12-11 MED ORDER — OXYBUTYNIN CHLORIDE 5 MG PO TABS
5.0000 mg | ORAL_TABLET | Freq: Two times a day (BID) | ORAL | Status: DC
  Administered 2020-12-11 – 2020-12-18 (×17): 5 mg via ORAL
  Filled 2020-12-11 (×17): qty 1

## 2020-12-11 MED ORDER — GABAPENTIN 400 MG PO CAPS
400.0000 mg | ORAL_CAPSULE | Freq: Two times a day (BID) | ORAL | Status: DC
  Administered 2020-12-11 – 2020-12-18 (×17): 400 mg via ORAL
  Filled 2020-12-11 (×17): qty 1

## 2020-12-11 MED ORDER — RIVAROXABAN 10 MG PO TABS
20.0000 mg | ORAL_TABLET | Freq: Every day | ORAL | Status: DC
  Administered 2020-12-11 – 2020-12-18 (×8): 20 mg via ORAL
  Filled 2020-12-11 (×8): qty 2

## 2020-12-11 MED ORDER — ACETAMINOPHEN 650 MG RE SUPP: 650.0000 mg | Freq: Four times a day (QID) | RECTAL | Status: DC | PRN

## 2020-12-11 MED ORDER — ACETAMINOPHEN 325 MG PO TABS
650.0000 mg | ORAL_TABLET | Freq: Four times a day (QID) | ORAL | Status: DC | PRN
  Administered 2020-12-11 – 2020-12-15 (×6): 650 mg via ORAL
  Filled 2020-12-11 (×6): qty 2

## 2020-12-11 MED ORDER — BRIMONIDINE TARTRATE 0.15 % OP SOLN: 1.0000 [drp] | Freq: Every day | OPHTHALMIC | Status: DC

## 2020-12-11 MED ORDER — ONDANSETRON HCL 4 MG/2ML IJ SOLN
4.0000 mg | Freq: Four times a day (QID) | INTRAMUSCULAR | Status: DC | PRN
  Administered 2020-12-14: 4 mg via INTRAVENOUS
  Filled 2020-12-11: qty 2

## 2020-12-11 MED ORDER — BRIMONIDINE TARTRATE 0.15 % OP SOLN
1.0000 [drp] | Freq: Every day | OPHTHALMIC | Status: DC
  Administered 2020-12-12 – 2020-12-18 (×8): 1 [drp] via OPHTHALMIC
  Filled 2020-12-11: qty 5

## 2020-12-11 MED ORDER — ONDANSETRON HCL 4 MG PO TABS: 4.0000 mg | ORAL_TABLET | Freq: Four times a day (QID) | ORAL | Status: DC | PRN

## 2020-12-11 MED ORDER — DORZOLAMIDE HCL 2 % OP SOLN
1.0000 [drp] | Freq: Two times a day (BID) | OPHTHALMIC | Status: DC
  Administered 2020-12-11 – 2020-12-18 (×15): 1 [drp] via OPHTHALMIC
  Filled 2020-12-11: qty 10

## 2020-12-11 MED ORDER — LATANOPROST 0.005 % OP SOLN
1.0000 [drp] | Freq: Every day | OPHTHALMIC | Status: DC
  Administered 2020-12-11 – 2020-12-18 (×8): 1 [drp] via OPHTHALMIC
  Filled 2020-12-11: qty 2.5

## 2020-12-11 NOTE — H&P (Signed)
History and Physical    [name] [name]:[id number] DOB: 06/26/1935 DOA: 12/10/2020  PCP: Ardith Dark, MD Patient coming from: home  Chief Complaint: Right leg wound  HPI: Steven Kent is a 86 y.o. male with medical history significant of chronic right leg edema, chronic right leg ulcer, HTN, AAA, CVA, who was brought to ED for right leg wound. Patient is a poor historian and not able to provide detailed history.  The HPI was obtained by chart review and talking to ED staff.  Patient lives at home with his family.  He has chronic right leg wound which was managed by [facility] and wound care. He was reportedly sitting in a wheelchair for over a week.  The EMS was called initially by his daughter to the scene to help get patient out of the wheelchair. But when they saw the condition he was in, the EMS decided to bring the patient to the ED. Per ED provider, the EMS noted that patient was sitting in the diaper which was dirty and had soaked dressing of right lower extremity with foul smell odor. His daughter did report that patient is more confused than normal.   In the emergency room, he was afebrile with pulse 72, RR 18, BP 127/59 and O2 sats 98% on room air.  The labs showed creatinine 1.41, CK 1104, normal WBC, lactic acid 2.5, negative COVID, unremarkable UA. CXR showed Mild cardiomegaly and vascular congestion. RLE Xray showed soft tissue edema. Head CT showed No acute intracranial abnormality.  Denies fevers, chills, cough, wheezing, shortness of breath, chest pain, chest pressure, nausea, vomiting, diarrhea, abdominal pain, dysuria, urinary frequency or urgency.  Review of Systems: As per HPI otherwise 10 point review of systems negative.  Review of Systems Otherwise negative except as per HPI, including: General: Denies fever, chills, night sweats or unintended weight loss. Resp: Denies cough, wheezing, shortness of breath. B/L legs edema R>L Cardiac: Denies chest pain,  palpitations, orthopnea, paroxysmal nocturnal dyspnea. GI: Denies abdominal pain, nausea, vomiting, diarrhea or constipation GU: Denies dysuria, frequency, hesitancy or incontinence MS: Denies muscle aches, joint pain or swelling Neuro: Denies headache, neurologic deficits (focal weakness, numbness, tingling), abnormal gait Psych: Denies anxiety, depression, SI/HI/AVH Skin: Right calf wound.  Right upper arm skin tear ID: Denies sick contacts, exotic exposures, travel  Past Medical History:  Diagnosis Date  . Abdominal aortic aneurysm (AAA) (HCC)   . Hypertension   . Stroke [facility])     Past Surgical History:  Procedure Laterality Date  . CATARACT EXTRACTION, BILATERAL     rt eye 10/09/2014 lt eye 11/06/2014  . DENTAL SURGERY    . REFRACTIVE SURGERY  01/2015    SOCIAL HISTORY:  reports that he has quit smoking. He has never used smokeless tobacco. He reports that he does not drink alcohol and does not use drugs.  Allergies  Allergen Reactions  . Aspirin Other (See Comments)    Full Strength Asprin-325 mg causes upset stomach. Can take 81 mg.    FAMILY HISTORY: Family History  Problem Relation Age of Onset  . Prostate cancer Neg Hx      Prior to Admission medications   Medication Sig Start Date End Date Taking? Authorizing Provider  acetaminophen (TYLENOL) 650 MG CR tablet Take 1,300 mg by mouth every 8 (eight) hours as needed for pain.   Yes [provider]  atorvastatin (LIPITOR) 40 MG tablet TAKE 1 TABLET(40 MG) BY MOUTH EVERY EVENING Patient taking differently: Take 40 mg  by mouth every evening. 09/07/20  Yes Ardith Dark, MD  brimonidine (ALPHAGAN) 0.15 % ophthalmic solution Place 1 drop into both eyes daily.   Yes [provider]  dorzolamide (TRUSOPT) 2 % ophthalmic solution Place 1 drop into both eyes 2 (two) times daily.   Yes [provider]  gabapentin (NEURONTIN) 400 MG capsule TAKE 1 CAPSULE(400 MG) BY MOUTH TWICE DAILY Patient taking  differently: Take 400 mg by mouth 2 (two) times daily. 09/14/20  Yes Ardith Dark, MD  latanoprost (XALATAN) 0.005 % ophthalmic solution Place 1 drop into both eyes at bedtime.   Yes [provider]  oxybutynin (DITROPAN) 5 MG tablet TAKE 1 TABLET(5 MG) BY MOUTH TWICE DAILY Patient taking differently: Take 5 mg by mouth 2 (two) times daily. 11/12/20  Yes Ardith Dark, MD  torsemide (DEMADEX) 10 MG tablet TAKE 2 TABLETS(20 MG) BY MOUTH DAILY Patient taking differently: Take 20 mg by mouth daily. 11/30/20  Yes Duke Salvia, MD  Vitamin D, Ergocalciferol, (DRISDOL) 1.25 MG (50000 UNIT) CAPS capsule TAKE 1 CAPSULE BY MOUTH EVERY 7 DAYS Patient taking differently: Take 50,000 Units by mouth every Wednesday. 01/16/20  Yes Parker, Katina Degree, MD  XARELTO 20 MG TABS tablet TAKE 1 TABLET(20 MG) BY MOUTH EVERY EVENING Patient taking differently: Take 20 mg by mouth daily with supper. 09/07/20  Yes Ardith Dark, MD  cephALEXin (KEFLEX) 500 MG capsule Take 1 capsule (500 mg total) by mouth 4 (four) times daily. Patient not taking: Reported on 12/10/2020 07/07/20   Terrilee Files, MD  HYDROcodone-acetaminophen (NORCO/VICODIN) 5-325 MG tablet Take 1-2 tablets by mouth every 6 (six) hours as needed for severe pain. Patient not taking: Reported on 12/10/2020 07/07/20   Terrilee Files, MD    Physical Exam: Vitals:   12/10/20 2330 12/10/20 2345 12/11/20 0015 12/11/20 0145  BP: 115/77 124/85 126/76 (!) 101/44  Pulse:    80  Resp: 20 18 16 12   Temp:      TempSrc:      SpO2:    93%      Constitutional: NAD, calm, comfortable.  Chronically ill-appearing.  Unkempt Eyes: PERRL, lids and conjunctivae normal ENMT: Mucous membranes are moist. Posterior pharynx clear of any exudate or lesions.Normal dentition.  Neck: normal, supple, no masses, no thyromegaly Respiratory: clear to auscultation bilaterally, no wheezing, no crackles. Normal respiratory effort. No accessory muscle use.   Cardiovascular: Regular rate and rhythm, no murmurs / rubs / gallops.2+ pedal pulses. No carotid bruits.  Abdomen: no tenderness, no masses palpated. No hepatosplenomegaly. Bowel sounds positive.  Musculoskeletal: no clubbing / cyanosis. No joint deformity upper and lower extremities. Good ROM, no contractures. Normal muscle tone.  Skin: B/L legs chronic edema R>L. Right calf wound. Please refer to the imaging in media Neurologic: CN 2-12 grossly intact. Sensation intact, DTR normal. Strength 5/5 in all 4.  Psychiatric: Normal judgment and insight. Alert and oriented x 3. Normal mood.    Labs on Admission: I have personally reviewed following labs and imaging studies  CBC: Recent Labs  Lab 12/10/20 1727  WBC 7.2  NEUTROABS 5.2  HGB 13.8  HCT 45.2  MCV 103.7*  PLT 168   Basic Metabolic Panel: Recent Labs  Lab 12/10/20 1727  Anchor Dwan 137  K 4.1  CL 104  CO2 23  GLUCOSE 110*  BUN 22  CREATININE 1.41*  CALCIUM 8.5*   GFR: Estimated Creatinine Clearance: 45.5 mL/min (A) (by C-G formula based on SCr of  1.41 mg/dL (H)). Liver Function Tests: Recent Labs  Lab 12/10/20 1727  AST 46*  ALT 17  ALKPHOS 58  BILITOT 1.2  PROT 7.2  ALBUMIN 3.1*   Recent Labs  Lab 12/10/20 1727  LIPASE 29   No results for input(s): AMMONIA in the last 168 hours. Coagulation Profile: No results for input(s): INR, PROTIME in the last 168 hours. Cardiac Enzymes: Recent Labs  Lab 12/10/20 1727  CKTOTAL 1,104*   BNP (last 3 results) No results for input(s): PROBNP in the last 8760 hours. HbA1C: No results for input(s): HGBA1C in the last 72 hours. CBG: No results for input(s): GLUCAP in the last 168 hours. Lipid Profile: No results for input(s): CHOL, HDL, LDLCALC, TRIG, CHOLHDL, LDLDIRECT in the last 72 hours. Thyroid Function Tests: No results for input(s): TSH, T4TOTAL, FREET4, T3FREE, THYROIDAB in the last 72 hours. Anemia Panel: No results for input(s): VITAMINB12, FOLATE,  FERRITIN, TIBC, IRON, RETICCTPCT in the last 72 hours. Urine analysis:    Component Value Date/Time   COLORURINE YELLOW 12/10/2020 2002   APPEARANCEUR CLEAR 12/10/2020 2002   LABSPEC 1.010 12/10/2020 2002   PHURINE 5.0 12/10/2020 2002   GLUCOSEU NEGATIVE 12/10/2020 2002   HGBUR SMALL (A) 12/10/2020 2002   BILIRUBINUR NEGATIVE 12/10/2020 2002   KETONESUR NEGATIVE 12/10/2020 2002   PROTEINUR NEGATIVE 12/10/2020 2002   NITRITE NEGATIVE 12/10/2020 2002   LEUKOCYTESUR NEGATIVE 12/10/2020 2002   Sepsis Labs: !!!!!!!!!!!!!!!!!!!!!!!!!!!!!!!!!!!!!!!!!!!! @LABRCNTIP (procalcitonin:4,lacticidven:4) ) Recent Results (from the past 240 hour(s))  SARS CORONAVIRUS 2 (TAT 6-24 HRS) Nasopharyngeal Nasopharyngeal Swab     Status: None   Collection Time: 12/10/20  6:52 PM   Specimen: Nasopharyngeal Swab  Result Value Ref Range Status   SARS Coronavirus 2 NEGATIVE NEGATIVE Final    Comment: (NOTE) SARS-CoV-2 target nucleic acids are NOT DETECTED.  The SARS-CoV-2 RNA is generally detectable in upper and lower respiratory specimens during the acute phase of infection. Negative results do not preclude SARS-CoV-2 infection, do not rule out co-infections with other pathogens, and should not be used as the sole basis for treatment or other patient management decisions. Negative results must be combined with clinical observations, patient history, and epidemiological information. The expected result is Negative.  Fact Sheet for Patients: HairSlick.nohttps://www.fda.gov/media/138098/download  Fact Sheet for Healthcare Providers: quierodirigir.comhttps://www.fda.gov/media/138095/download  This test is not yet approved or cleared by the [place] FDA and  has been authorized for detection and/or diagnosis of SARS-CoV-2 by FDA under an Emergency Use Authorization (EUA). This EUA will remain  in effect (meaning this test can be used) for the duration of the COVID-19 declaration under Se ction 564(b)(1) of the Act, 21  U.S.C. section 360bbb-3(b)(1), unless the authorization is terminated or revoked sooner.  Performed at [facility], [address] [place], [place] [address]      Radiological Exams on Admission: DG Chest 1 View  Result Date: 12/10/2020 CLINICAL DATA:  Altered mental status. EXAM: CHEST  1 VIEW COMPARISON:  Radiograph 08/23/2019. FINDINGS: Implanted monitoring device projects over the left chest wall. Mild cardiomegaly. Aortic atherosclerosis tortuosity. Asymmetry of the hemi thoraces is felt to be due to patient rotation. Subsegmental atelectasis in the right mid lung. Mild vascular redistribution without pulmonary edema. No significant pleural effusion. No pneumothorax. Bones are diffusely under mineralized. IMPRESSION: 1. Mild cardiomegaly and vascular congestion. 2. Subsegmental atelectasis in the right mid lung. Electronically Signed   By: Narda RutherfordMelanie  Sanford M.D.   On: 12/10/2020 19:42   DG Tibia/Fibula Right  Result Date:  12/10/2020 CLINICAL DATA:  Wound. EXAM: RIGHT TIBIA AND FIBULA - 2 VIEW COMPARISON:  None. FINDINGS: Divided lateral views of the lower extremity. AP views could not be obtained due to patient medical condition. The bones are diffusely under mineralized. No fracture, periosteal reaction, focal bone lesion or erosion. Knee osteoarthritis with a probable ossified intra-articular body posteriorly in a Baker cyst. Diffuse soft tissue edema. Vascular calcifications. IMPRESSION: 1. Diffuse soft tissue edema. No acute osseous abnormality. 2. Knee osteoarthritis with probable ossified intra-articular body in a Baker cyst. 3. Technically limited exam due to difficulties with positioning. Electronically Signed   By: Narda Rutherford M.D.   On: 12/10/2020 19:44   CT Head Wo Contrast  Result Date: 12/10/2020 CLINICAL DATA:  Mental status change. EXAM: CT HEAD WITHOUT CONTRAST TECHNIQUE: Contiguous axial images were obtained from the base of the skull through the vertex without  intravenous contrast. COMPARISON:  Head CT 11/17/2019, brain MRI 02/05/2020 FINDINGS: Brain: Normal for age atrophy. Advanced chronic small vessel ischemic change. Remote lacunar infarcts in the basal ganglia. No intracranial hemorrhage, mass effect, or midline shift. No hydrocephalus. The basilar cisterns are patent. No evidence of territorial infarct or acute ischemia. No extra-axial or intracranial fluid collection. Vascular: No hyperdense vessel. Skull: No fracture or focal lesion. Sinuses/Orbits: Abnormal appearance of the right globe with high density in the dependent aspect of the posterior chamber, slightly progressed from prior MRI. There is also thickening and high density involving the superior ophthalmic vein. Bilateral lens extraction. Paranasal sinuses and mastoid air cells are clear. Other: None. IMPRESSION: 1. No acute intracranial abnormality. 2. Advanced chronic small vessel ischemic change. 3. Abnormal appearance of the right globe with high density in the dependent aspect of the posterior chamber, slightly progressed from prior MRI. There is thickening and high density involving the superior ophthalmic vein that may represent chronic thrombus. This could be further assessed with orbital MRI based on clinical concern. Electronically Signed   By: Narda Rutherford M.D.   On: 12/10/2020 19:36     All images have been reviewed by me personally.  EKG: Independently reviewed.   Assessment/Plan Active Problems:   Hemiparesis affecting right side as late effect of cerebrovascular accident [facility])   Essential hypertension   Leg edema   Hyperlipidemia   Cellulitis of leg, right   Pulmonary vascular congestion   Rhabdomyolysis   Lactic acid acidosis    Assessment  plan  # chronic right leg wound with infection #chronic right leg edema,  # chronic right leg ulcer Patient has had chronic right leg edema with chronic right calf ulcer for months.  He is admitted for right calf wound  infection with foul odor  -Does not meet SIRS criteria -Blood cultures pending -Antibiotics vancomycin and cefepime - wound care consult   # HTN # CKD stage III,  - stable - Cr 1.4 at baseline   # Pulmonary vascular congestion   -Denies shortness of breath.  He is not hypoxia -This is noted on chest x-ray - he received NS bolus at the ED - will hold further IVF    # Mild rhabdomyolysis # Mild lactic acidosis  CK 1104 Lactic acid 2.5--> normalized Received IVF at the ED   #Abnormal finding on head CT There is thickening and high density involving the superior ophthalmic vein that may represent chronic thrombus. This could be further assessed with orbital MRI based on clinical concern. - incidental finding on head CT - can obtain nonurgent MRI  DVT prophylaxis: SCD and Xarelto Code Status: Full code Family Communication: none at bedside Consults called: none Admission status: obs  Status is: Observation  The patient remains OBS appropriate and will d/c before 2 midnights.  Dispo: The patient is from: Home              Anticipated d/c is to: Home              Patient currently is not medically stable to d/c.   Difficult to place patient No       Time Spent: 65 minutes.  >50% of the time was devoted to discussing the patients care, assessment, plan and disposition with other care givers along with counseling the patient about the risks and benefits of treatment.    Dede Query MD [facility]  If 7PM-7AM, please contact night-coverage   12/11/2020, 2:45 AM

## 2020-12-11 NOTE — Progress Notes (Signed)
No charge progress note.  Steven Kent is a 86 y.o. male with medical history significant of chronic right leg edema, chronic right leg ulcer, HTN, AAA, CVA, who was brought to ED for right leg wound. Patient is a poor historian and not able to provide detailed history.  The HPI was obtained by chart review and talking to ED staff.  Patient lives at home with his family.  He has chronic right leg wound which was managed by [facility] and wound care. He was reportedly sitting in a wheelchair for over a week.  The EMS was called initially by his daughter to the scene to help get patient out of the wheelchair. But when they saw the condition he was in, the EMS decided to bring the patient to the ED. Per ED provider, the EMS noted that patient was sitting in the diaper which was dirty and had soaked dressing of right lower extremity with foul smell odor. His daughter did report that patient is more confused than normal.   In the emergency room, he was afebrile with pulse 72, RR 18, BP 127/59 and O2 sats 98% on room air.  The labs showed creatinine 1.41, CK 1104, normal WBC, lactic acid 2.5, negative COVID, unremarkable UA. CXR showed Mild cardiomegaly and vascular congestion. RLE Xray showed soft tissue edema. Head CT showed No acute intracranial abnormality.  Admitted for concern of altered mental status with chronic wounds.  Started on vancomycin and cefepime and wound care was consulted.  See their recommendations.  Patient was seen and examined today.  Appears to be at his baseline.  He was having chronic lower extremity wounds for which he sees wound care.  Bilateral lower extremity with chronic venous stasis, scaling and lymphedema.  Multiple wounds on both lower extremities, more on right.  Patient denies any pain, fever or chills, nausea or vomiting.  Eating and drinking okay.  Wants to go home by tomorrow.  Patient was also found to have elevated CK and lactic acid which improved with IV  fluid.  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

## 2020-12-11 NOTE — ED Notes (Signed)
Attempted to call report. RN and charge is unavailable at this time. Extension given for call back.

## 2020-12-11 NOTE — Plan of Care (Signed)
  Problem: Education: Goal: Knowledge of General Education information will improve Description: Including pain rating scale, medication(s)/side effects and non-pharmacologic comfort measures Outcome: Progressing   Problem: Nutrition: Goal: Adequate nutrition will be maintained Outcome: Progressing   

## 2020-12-11 NOTE — ED Notes (Signed)
Called to give report; RN will call back in 2 minutes.

## 2020-12-11 NOTE — Progress Notes (Signed)
NEW ADMISSION NOTE New Admission Note:   Arrival Method: Stretcher Mental Orientation: AAOx1   Telemetry: 5M14 Assessment: Completed Skin: To be completed IV: LAC, LH Pain: 0/10 Tubes: n/a Safety Measures: Safety Fall Prevention Plan has been given, discussed and signed Admission: Completed [facility] Orientation: Patient has been orientated to the room, unit and staff.  Family: none present at bedside  Orders have been reviewed and implemented. Will continue to monitor the patient. Call light has been placed within reach and bed alarm has been activated.   Myrtis Hopping, RN

## 2020-12-11 NOTE — Consult Note (Addendum)
WOC Nurse Consult Note: Reason for Consult: Consult requested for right upper arm and right leg wounds. Right upper arm with partial thickness abrasion; pink and moist with loose peeling skin to wound edges, small amt yellow drainage, 3X4X.1cm Right lower calf with partial thickness abrasion; 2X4X.1cm, yellow and dry wound bed. Generalized edema to right leg. Right upper posterior calf with full thickness stasis ulcer; 6X10X.2cm, 80% yellow and moist, 20% red.  Mod amt yellow drainage, strong odor, dry scabbed edges.  Right middle calf with full and dry scabbed wound edges thickness stasis ulcer; 4X4X.2cm, dry yellow wound bed, mod amt yellow drainage Dressing procedure/placement/frequency: Topical treatment orders provided for bedside nurses to perform as follows to absorb drainage, provide antibacterial benefits, and promote healing: Apply Aquacel [place] # [phone]) to right leg Q day, then cover with ABD pad and kerlex, beginning just behind toes to below knee, then in the same manner with ace wrap.  Moisten Aquacel with NS each time to assist with removal. Foam dressing to right arm and right lower calf wounds to protect from further injury.  Change Q 3 days or PRN soiling.  Please re-consult if further assistance is needed.  Thank-you,  Cammie Mcgee MSN, RN, CWOCN, [place], CNS [phone]

## 2020-12-11 NOTE — Progress Notes (Signed)
Attempted to call ED back at [phone] to get report, no answer. Will try again.  Jean Rosenthal, RN

## 2020-12-11 NOTE — ED Notes (Signed)
Male purewick changed. Condom cath put on patient. Peri care performed

## 2020-12-11 NOTE — Progress Notes (Addendum)
Pharmacy Antibiotic Note  Steven Kent is a 86 y.o. male admitted on 12/10/2020 with cellulitis.  Pharmacy has been consulted for vancomycin and cefepime dosing. Patient PMH significant for chronic lower extremity swelling and wounds on the right lower extremity. Patient presents to ED with complaints of unable to get out of wheelchair. Daughter reports patient is AMS at baseline but appears worse. EMS found patient in wheel chair with foul smelling wound dressing and soiled undergarments. Current WBC 7.2 and Scr 1.41. Patient is afebrile.   Plan: Cefepime 2gm IV q12h Vancomycin 1250 MG Q24H (AUC 463, Scr 1.41)  Follow up renal function, clinical status, and treatment plan  Vancomycin levels as indicated       Temp (24hrs), Avg:98.6 F (37 C), Min:98.6 F (37 C), Max:98.6 F (37 C)  Recent Labs  Lab 12/10/20 1727 12/10/20 2230  WBC 7.2  --   CREATININE 1.41*  --   LATICACIDVEN 2.5* 1.6    Estimated Creatinine Clearance: 45.5 mL/min (A) (by C-G formula based on SCr of 1.41 mg/dL (H)).    Allergies  Allergen Reactions  . Aspirin Other (See Comments)    Full Strength Asprin-325 mg causes upset stomach. Can take 81 mg.    Antimicrobials this admission: 5/12 CTX x1  5/12 Vancomycin >> 5/13 Cefepime >>  Dose adjustments this admission: NA  Microbiology results: 5/12 BCx:  5/12 UCx:     Leia Alf, PharmD, BCPS Please check AMION for all [facility] contact numbers Clinical Pharmacist 12/11/2020 8:16 AM

## 2020-12-12 ENCOUNTER — Observation Stay (HOSPITAL_COMMUNITY): Payer: Medicare Other

## 2020-12-12 DIAGNOSIS — L03115 Cellulitis of right lower limb: Secondary | ICD-10-CM | POA: Diagnosis present

## 2020-12-12 DIAGNOSIS — I130 Hypertensive heart and chronic kidney disease with heart failure and stage 1 through stage 4 chronic kidney disease, or unspecified chronic kidney disease: Secondary | ICD-10-CM | POA: Diagnosis present

## 2020-12-12 DIAGNOSIS — I5032 Chronic diastolic (congestive) heart failure: Secondary | ICD-10-CM | POA: Diagnosis present

## 2020-12-12 DIAGNOSIS — R0989 Other specified symptoms and signs involving the circulatory and respiratory systems: Secondary | ICD-10-CM | POA: Diagnosis present

## 2020-12-12 DIAGNOSIS — G9341 Metabolic encephalopathy: Secondary | ICD-10-CM | POA: Diagnosis present

## 2020-12-12 DIAGNOSIS — R4182 Altered mental status, unspecified: Secondary | ICD-10-CM | POA: Diagnosis not present

## 2020-12-12 DIAGNOSIS — M7989 Other specified soft tissue disorders: Secondary | ICD-10-CM | POA: Diagnosis present

## 2020-12-12 DIAGNOSIS — E872 Acidosis: Secondary | ICD-10-CM | POA: Diagnosis present

## 2020-12-12 DIAGNOSIS — Z7901 Long term (current) use of anticoagulants: Secondary | ICD-10-CM | POA: Diagnosis not present

## 2020-12-12 DIAGNOSIS — Z20822 Contact with and (suspected) exposure to covid-19: Secondary | ICD-10-CM | POA: Diagnosis present

## 2020-12-12 DIAGNOSIS — R748 Abnormal levels of other serum enzymes: Secondary | ICD-10-CM | POA: Diagnosis not present

## 2020-12-12 DIAGNOSIS — R400 Somnolence: Secondary | ICD-10-CM | POA: Diagnosis present

## 2020-12-12 DIAGNOSIS — R443 Hallucinations, unspecified: Secondary | ICD-10-CM | POA: Diagnosis present

## 2020-12-12 DIAGNOSIS — I10 Essential (primary) hypertension: Secondary | ICD-10-CM | POA: Diagnosis not present

## 2020-12-12 DIAGNOSIS — Z886 Allergy status to analgesic agent status: Secondary | ICD-10-CM | POA: Diagnosis not present

## 2020-12-12 DIAGNOSIS — I2510 Atherosclerotic heart disease of native coronary artery without angina pectoris: Secondary | ICD-10-CM | POA: Diagnosis present

## 2020-12-12 DIAGNOSIS — E785 Hyperlipidemia, unspecified: Secondary | ICD-10-CM | POA: Diagnosis present

## 2020-12-12 DIAGNOSIS — T502X5A Adverse effect of carbonic-anhydrase inhibitors, benzothiadiazides and other diuretics, initial encounter: Secondary | ICD-10-CM | POA: Diagnosis not present

## 2020-12-12 DIAGNOSIS — Z79899 Other long term (current) drug therapy: Secondary | ICD-10-CM | POA: Diagnosis not present

## 2020-12-12 DIAGNOSIS — Z87891 Personal history of nicotine dependence: Secondary | ICD-10-CM | POA: Diagnosis not present

## 2020-12-12 DIAGNOSIS — M6282 Rhabdomyolysis: Secondary | ICD-10-CM | POA: Diagnosis present

## 2020-12-12 DIAGNOSIS — N1831 Chronic kidney disease, stage 3a: Secondary | ICD-10-CM | POA: Diagnosis present

## 2020-12-12 DIAGNOSIS — I69351 Hemiplegia and hemiparesis following cerebral infarction affecting right dominant side: Secondary | ICD-10-CM | POA: Diagnosis not present

## 2020-12-12 DIAGNOSIS — E876 Hypokalemia: Secondary | ICD-10-CM | POA: Diagnosis not present

## 2020-12-12 DIAGNOSIS — I714 Abdominal aortic aneurysm, without rupture: Secondary | ICD-10-CM | POA: Diagnosis present

## 2020-12-12 DIAGNOSIS — L97219 Non-pressure chronic ulcer of right calf with unspecified severity: Secondary | ICD-10-CM | POA: Diagnosis present

## 2020-12-12 LAB — URINE CULTURE: Culture: <10000 — AB

## 2020-12-12 LAB — HEMOGLOBIN A1C
Hgb A1c MFr Bld: 5.9 % — ABNORMAL HIGH (ref 4.8–5.6)
Mean Plasma Glucose: 122.63 mg/dL

## 2020-12-12 MED ORDER — ATORVASTATIN CALCIUM 40 MG PO TABS
40.0000 mg | ORAL_TABLET | Freq: Every day | ORAL | Status: DC
  Administered 2020-12-12 – 2020-12-18 (×7): 40 mg via ORAL
  Filled 2020-12-12 (×7): qty 1

## 2020-12-12 MED ORDER — TORSEMIDE 20 MG PO TABS
20.0000 mg | ORAL_TABLET | Freq: Every day | ORAL | Status: DC
  Administered 2020-12-12 – 2020-12-18 (×7): 20 mg via ORAL
  Filled 2020-12-12 (×7): qty 1

## 2020-12-12 NOTE — Progress Notes (Signed)
Pt off unit to MRI.

## 2020-12-12 NOTE — Plan of Care (Signed)

## 2020-12-12 NOTE — Progress Notes (Signed)
PROGRESS NOTE  Steven Kent DZH:[id number] DOB: June 03, 1935 DOA: 12/10/2020 PCP: Ardith Dark, MD   LOS: 0 days   Brief Narrative / Interim history: 86 year old male with history of chronic leg edema, chronic right leg ulcer, hypertension, AAA, prior CVA with residual right-sided weakness was brought into the ED for right leg wound, nonhealing.  Patient tells me that he has been having more difficulties moving his right leg and has been so weak that he was unable to ambulate over the last few days.  I also discussed with the wife over the phone.  She tells me that over the last 3 to 4 days patient indeed has been nonambulatory because he is unable to move his right leg.  She also reports that he has been more confused and having intermittent hallucinations which is very unusual for him, normally he is alert and orient x4.  They initially called EMS to assist with recommendation from the wheelchair to bed, however on EMS evaluation he was disoriented to time, place, situation and brought to the hospital.  Subjective / 24h Interval events: He is eating breakfast this morning, doing overall well.  He tells me that up until last week he was able to ambulate with a walker, and now he cannot really move his right leg and does not really understand why.  He also tells me he feels "confused" and "not good" but cannot really elaborate this further.  Assessment & Plan: Principal Problem Right leg wound, cellulitis -patient was not septic on admission but there was concern for an infectious process given confusion.  He was started on vancomycin and cefepime, I will continue for today and narrow tomorrow -Wound care consulted  Active Problems Acute metabolic encephalopathy -patient with confusion at home, hallucinations, concern for infectious process versus other.  Still there is some confusion this morning, patient engaging in discussion but at times cannot fully elaborate what he is feeling.  We  will obtain an MRI of the brain given worsening right-sided weakness and inability to walk due to this for the past few days   Essential hypertension -add back torsemide  Chronic kidney disease stage III -Baseline around 1.2 - 1.5, at baseline  Pulmonary vascular congestion on chest x-ray, chronic lower extremity swelling, chronic diastolic CHF-add back torsemide today, he is breathing comfortably on room air.  Mild rhabdomyolysis -stable  Prior CVA with right-sided hemiparesis -patient at baseline can ambulate but has been unable to do so in the last 3 to 4 days, his right side appears to be much weaker, will obtain an MRI of the brain  Hyperlipidemia -resume home statin  Prior recurrent VTE -continue Xarelto  Scheduled Meds: . atorvastatin  40 mg Oral Daily  . brimonidine  1 drop Both Eyes Daily  . dorzolamide  1 drop Both Eyes BID  . gabapentin  400 mg Oral BID  . latanoprost  1 drop Both Eyes QHS  . oxybutynin  5 mg Oral BID  . rivaroxaban  20 mg Oral Q supper  . torsemide  20 mg Oral Daily   Continuous Infusions: . ceFEPime (MAXIPIME) IV Stopped (12/12/20 0435)  . vancomycin 1,250 mg (12/11/20 1813)   PRN Meds:.acetaminophen **OR** acetaminophen, ondansetron **OR** ondansetron (ZOFRAN) IV  Diet Orders (From admission, onward)    Start     Ordered   12/11/20 0209  Diet regular Room service appropriate? Yes; Fluid consistency: Thin  Diet effective now       Question Answer Comment  Room service  appropriate? Yes   Fluid consistency: Thin      12/11/20 0208          DVT prophylaxis: rivaroxaban (XARELTO) tablet 20 mg Start: 12/11/20 1700 SCDs Start: 12/11/20 0246 rivaroxaban (XARELTO) tablet 20 mg     Code Status: Full Code  Family Communication: Discussed with wife over the phone and in person  Status is: Observation  The patient will require care spanning > 2 midnights and should be moved to inpatient because: Altered mental status  Dispo: The patient is  from: Home              Anticipated d/c is to: Home              Patient currently is not medically stable to d/c.   Difficult to place patient No  Level of care: Telemetry Medical  Consultants:  None  Procedures:  none  Microbiology  Blood cultures 5/12-no growth  Antimicrobials: VAC cefepime 5/13 >>   Objective: Vitals:   12/11/20 2040 12/11/20 2344 12/12/20 0558 12/12/20 0803  BP: 129/61 (!) 103/58 116/66 130/81  Pulse: 72 76 75 72  Resp: 16 18 18 17   Temp: 98.3 F (36.8 C) 97.9 F (36.6 C) 98.9 F (37.2 C) 98.5 F (36.9 C)  TempSrc: Oral Oral Oral Oral  SpO2: 99% 97% 100% 100%  Weight:      Height:        Intake/Output Summary (Last 24 hours) at 12/12/2020 1043 Last data filed at 12/12/2020 0600 Gross per 24 hour  Intake 1150 ml  Output 500 ml  Net 650 ml   Filed Weights   12/11/20 1119  Weight: 84.5 kg    Examination:  Constitutional: NAD Eyes: no scleral icterus ENMT: Mucous membranes are moist.  Neck: normal, supple Respiratory: clear to auscultation bilaterally, no wheezing, no crackles. Normal respiratory effort.  Cardiovascular: Regular rate and rhythm, no murmurs / rubs / gallops. 1+ LE edema.  Abdomen: non distended, no tenderness. Bowel sounds positive.  Musculoskeletal: no clubbing / cyanosis.  Skin: Chronic venous stasis changes bilateral lower extremities, 1 just below Neurologic: Right-sided weakness upper and lower extremity, unable to move right lower extremity, normal strength on left  Data Reviewed: I have independently reviewed following labs and imaging studies   CBC: Recent Labs  Lab 12/10/20 1727 12/11/20 0807  WBC 7.2 7.2  NEUTROABS 5.2 5.3  HGB 13.8 12.6*  HCT 45.2 39.4  MCV 103.7* 98.5  PLT 168 163   Basic Metabolic Panel: Recent Labs  Lab 12/10/20 1727 12/11/20 0807  NA 137 141  K 4.1 3.8  CL 104 107  CO2 23 26  GLUCOSE 110* 92  BUN 22 20  CREATININE 1.41* 1.27*  CALCIUM 8.5* 8.1*   Liver Function  Tests: Recent Labs  Lab 12/10/20 1727  AST 46*  ALT 17  ALKPHOS 58  BILITOT 1.2  PROT 7.2  ALBUMIN 3.1*   Coagulation Profile: No results for input(s): INR, PROTIME in the last 168 hours. HbA1C: Recent Labs    12/12/20 0945  HGBA1C 5.9*   CBG: Recent Labs  Lab 12/11/20 2138  GLUCAP 101*    Recent Results (from the past 240 hour(s))  Culture, blood (routine x 2)     Status: None (Preliminary result)   Collection Time: 12/10/20  5:32 PM   Specimen: BLOOD LEFT HAND  Result Value Ref Range Status   Specimen Description BLOOD LEFT HAND  Final   Special Requests   Final  BOTTLES DRAWN AEROBIC AND ANAEROBIC Blood Culture adequate volume   Culture   Final    NO GROWTH < 24 HOURS Performed at [facility], [address], [place], [place] [address]    Report Status PENDING  Incomplete  SARS CORONAVIRUS 2 (TAT 6-24 HRS) Nasopharyngeal Nasopharyngeal Swab     Status: None   Collection Time: 12/10/20  6:52 PM   Specimen: Nasopharyngeal Swab  Result Value Ref Range Status   SARS Coronavirus 2 NEGATIVE NEGATIVE Final    Comment: (NOTE) SARS-CoV-2 target nucleic acids are NOT DETECTED.  The SARS-CoV-2 RNA is generally detectable in upper and lower respiratory specimens during the acute phase of infection. Negative results do not preclude SARS-CoV-2 infection, do not rule out co-infections with other pathogens, and should not be used as the sole basis for treatment or other patient management decisions. Negative results must be combined with clinical observations, patient history, and epidemiological information. The expected result is Negative.  Fact Sheet for Patients: HairSlick.no  Fact Sheet for Healthcare Providers: quierodirigir.com  This test is not yet approved or cleared by the [place] FDA and  has been authorized for detection and/or diagnosis of SARS-CoV-2 by FDA under an Emergency Use  Authorization (EUA). This EUA will remain  in effect (meaning this test can be used) for the duration of the COVID-19 declaration under Se ction 564(b)(1) of the Act, 21 U.S.C. section 360bbb-3(b)(1), unless the authorization is terminated or revoked sooner.  Performed at [facility], [address] [place], [place] [address]      Radiology Studies: No results found.   Time spent: 35 minutes in 2 separate visits morning and afternoon, more than 25 minutes at bedside   Steven Pert, MD, PhD [facility]  Between 7 am - 7 pm I am available, please contact me via Amion (for emergencies) or Securechat (non urgent messages)  Between 7 pm - 7 am I am not available, please contact night coverage MD/APP via Amion

## 2020-12-13 DIAGNOSIS — L8990 Pressure ulcer of unspecified site, unspecified stage: Secondary | ICD-10-CM | POA: Insufficient documentation

## 2020-12-13 LAB — COMPREHENSIVE METABOLIC PANEL
ALT: 20 U/L (ref 0–44)
AST: 38 U/L (ref 15–41)
Albumin: 2.3 g/dL — ABNORMAL LOW (ref 3.5–5.0)
Alkaline Phosphatase: 44 U/L (ref 38–126)
Anion gap: 6 (ref 5–15)
BUN: 18 mg/dL (ref 8–23)
CO2: 28 mmol/L (ref 22–32)
Calcium: 7.9 mg/dL — ABNORMAL LOW (ref 8.9–10.3)
Chloride: 105 mmol/L (ref 98–111)
Creatinine, Ser: 1.22 mg/dL (ref 0.61–1.24)
GFR, Estimated: 59 mL/min — ABNORMAL LOW (ref >60)
Glucose, Bld: 118 mg/dL — ABNORMAL HIGH (ref 70–99)
Potassium: 3.2 mmol/L — ABNORMAL LOW (ref 3.5–5.1)
Sodium: 139 mmol/L (ref 135–145)
Total Bilirubin: 0.8 mg/dL (ref 0.3–1.2)
Total Protein: 6.2 g/dL — ABNORMAL LOW (ref 6.5–8.1)

## 2020-12-13 LAB — CBC
HCT: 37.5 % — ABNORMAL LOW (ref 39.0–52.0)
Hemoglobin: 12.3 g/dL — ABNORMAL LOW (ref 13.0–17.0)
MCH: 31.5 pg (ref 26.0–34.0)
MCHC: 32.8 g/dL (ref 30.0–36.0)
MCV: 95.9 fL (ref 80.0–100.0)
Platelets: 155 10*3/uL (ref 150–400)
RBC: 3.91 MIL/uL — ABNORMAL LOW (ref 4.22–5.81)
RDW: 14.7 % (ref 11.5–15.5)
WBC: 6.7 10*3/uL (ref 4.0–10.5)
nRBC: 0 % (ref 0.0–0.2)

## 2020-12-13 LAB — MAGNESIUM: Magnesium: 1.9 mg/dL (ref 1.7–2.4)

## 2020-12-13 LAB — CK: Total CK: 678 U/L — ABNORMAL HIGH (ref 49–397)

## 2020-12-13 LAB — PHOSPHORUS: Phosphorus: 3 mg/dL (ref 2.5–4.6)

## 2020-12-13 MED ORDER — SODIUM CHLORIDE 0.9 % IV SOLN
100.0000 mg | Freq: Two times a day (BID) | INTRAVENOUS | Status: DC
  Administered 2020-12-13 – 2020-12-15 (×6): 100 mg via INTRAVENOUS
  Filled 2020-12-13 (×7): qty 100

## 2020-12-13 MED ORDER — SODIUM CHLORIDE 0.9 % IV SOLN
INTRAVENOUS | Status: DC | PRN
  Administered 2020-12-13: 500 mL via INTRAVENOUS

## 2020-12-13 MED ORDER — POTASSIUM CHLORIDE CRYS ER 20 MEQ PO TBCR
40.0000 meq | EXTENDED_RELEASE_TABLET | Freq: Once | ORAL | Status: AC
  Administered 2020-12-13: 40 meq via ORAL
  Filled 2020-12-13: qty 2

## 2020-12-13 NOTE — Progress Notes (Addendum)
PROGRESS NOTE  Steven AquasBenjamin L Kent ZOX:[id number] DOB: Jun 16, 1935 DOA: 12/10/2020 PCP: Ardith DarkParker, Caleb M, MD   LOS: 1 day   Brief Narrative / Interim history: 86 year old male with history of chronic leg edema, chronic right leg ulcer, hypertension, AAA, prior CVA with residual right-sided weakness was brought into the ED for right leg wound, nonhealing.  Patient tells me that he has been having more difficulties moving his right leg and has been so weak that he was unable to ambulate over the last few days.  I also discussed with the wife over the phone.  She tells me that over the last 3 to 4 days patient indeed has been nonambulatory because he is unable to move his right leg.  She also reports that he has been more confused and having intermittent hallucinations which is very unusual for him, normally he is alert and orient x4.  They initially called EMS to assist with recommendation from the wheelchair to bed, however on EMS evaluation he was disoriented to time, place, situation and brought to the hospital.  Subjective / 24h Interval events: He is eating breakfast this morning, doing overall well.  He tells me that up until last week he was able to ambulate with a walker, and now he cannot really move his right leg and does not really understand why.  He also tells me he feels "confused" and "not good" but cannot really elaborate this further.  Assessment & Plan: Principal Problem Right leg wound, cellulitis -patient was not septic on admission but there was concern for an infectious process given confusion.  He was started on vancomycin and cefepime, blood cultures have remained negative and will narrow to doxycycline today.  Active Problems Acute metabolic encephalopathy /generalized weakness-patient with confusion at home, hallucinations, concern for infectious process versus other.  Seems to be clear this morning, wife agrees that he has improved with antibiotics to treat #1.  MRI of the  brain was negative for acute strokes.  PT to evaluate today  Hypokalemia-due to diuretics, replete  Essential hypertension -add back torsemide  Chronic kidney disease stage III -Baseline around 1.2 - 1.5, at baseline  Pulmonary vascular congestion on chest x-ray, chronic lower extremity swelling, chronic diastolic CHF-continue torsemide, on room air  Mild rhabdomyolysis -stable, CK improving  Prior CVA with right-sided hemiparesis -patient at baseline can ambulate but has been unable to do so in the last 3 to 4 days, his right side appears to be much weaker.  MRI of the brain negative.  PT to evaluate, suspect he will need SNF  Hyperlipidemia -resume home statin  Prior recurrent VTE -continue Xarelto  Scheduled Meds: . atorvastatin  40 mg Oral Daily  . brimonidine  1 drop Both Eyes Daily  . dorzolamide  1 drop Both Eyes BID  . gabapentin  400 mg Oral BID  . latanoprost  1 drop Both Eyes QHS  . oxybutynin  5 mg Oral BID  . potassium chloride  40 mEq Oral Once  . rivaroxaban  20 mg Oral Q supper  . torsemide  20 mg Oral Daily   Continuous Infusions: . ceFEPime (MAXIPIME) IV 2 g (12/13/20 0300)  . vancomycin Stopped (12/12/20 1824)   PRN Meds:.acetaminophen **OR** acetaminophen, ondansetron **OR** ondansetron (ZOFRAN) IV  Diet Orders (From admission, onward)    Start     Ordered   12/11/20 0209  Diet regular Room service appropriate? Yes; Fluid consistency: Thin  Diet effective now       Question Answer  Comment  Room service appropriate? Yes   Fluid consistency: Thin      12/11/20 0208          DVT prophylaxis: rivaroxaban (XARELTO) tablet 20 mg Start: 12/11/20 1700 SCDs Start: 12/11/20 0246 rivaroxaban (XARELTO) tablet 20 mg     Code Status: Full Code  Family Communication: No family at bedside  Status is: Inpatient  Altered mental status  Dispo: The patient is from: Home              Anticipated d/c is to: Home              Patient currently is not  medically stable to d/c.   Difficult to place patient No  Level of care: Telemetry Medical  Consultants:  None  Procedures:  none  Microbiology  Blood cultures 5/12-no growth  Antimicrobials: VAC cefepime 5/13 >>   Objective: Vitals:   12/12/20 1745 12/12/20 1926 12/13/20 0455 12/13/20 0936  BP: (!) 116/58 (!) 127/53 133/61 (!) 136/57  Pulse: 71 85 81 70  Resp: 18 16 19 18   Temp: 98.4 F (36.9 C) (!) 97.4 F (36.3 C) 100.3 F (37.9 C) 98.9 F (37.2 C)  TempSrc: Oral Oral Oral Oral  SpO2: 99% 99% 100% 98%  Weight:      Height:        Intake/Output Summary (Last 24 hours) at 12/13/2020 0954 Last data filed at 12/13/2020 0600 Gross per 24 hour  Intake 1390 ml  Output 3050 ml  Net -1660 ml   Filed Weights   12/11/20 1119  Weight: 84.5 kg    Examination:  Constitutional: He is in no distress Eyes: No icterus ENMT: mmm  Neck: normal, supple Respiratory: Lungs are clear bilaterally without wheezing or crackles, normal respiratory effort Cardiovascular: Regular rate and rhythm, no murmurs, 1+ lower extremity edema Abdomen: Nondistended, no tenderness Musculoskeletal: no clubbing / cyanosis.  Skin: Chronic venous stasis changes bilateral lower extremities.  Wound pictured as below Neurologic: Persistent right-sided weakness, still unable to move his right leg      Data Reviewed: I have independently reviewed following labs and imaging studies   CBC: Recent Labs  Lab 12/10/20 1727 12/11/20 0807 12/13/20 0431  WBC 7.2 7.2 6.7  NEUTROABS 5.2 5.3  --   HGB 13.8 12.6* 12.3*  HCT 45.2 39.4 37.5*  MCV 103.7* 98.5 95.9  PLT 168 163 155   Basic Metabolic Panel: Recent Labs  Lab 12/10/20 1727 12/11/20 0807 12/13/20 0431  NA 137 141 139  K 4.1 3.8 3.2*  CL 104 107 105  CO2 23 26 28   GLUCOSE 110* 92 118*  BUN 22 20 18   CREATININE 1.41* 1.27* 1.22  CALCIUM 8.5* 8.1* 7.9*  MG  --   --  1.9  PHOS  --   --  3.0   Liver Function Tests: Recent Labs   Lab 12/10/20 1727 12/13/20 0431  AST 46* 38  ALT 17 20  ALKPHOS 58 44  BILITOT 1.2 0.8  PROT 7.2 6.2*  ALBUMIN 3.1* 2.3*   Coagulation Profile: No results for input(s): INR, PROTIME in the last 168 hours. HbA1C: Recent Labs    12/12/20 0945  HGBA1C 5.9*   CBG: Recent Labs  Lab 12/11/20 2138  GLUCAP 101*    Recent Results (from the past 240 hour(s))  Culture, blood (routine x 2)     Status: None (Preliminary result)   Collection Time: 12/10/20  5:32 PM   Specimen: BLOOD LEFT HAND  Result  Value Ref Range Status   Specimen Description BLOOD LEFT HAND  Final   Special Requests   Final    BOTTLES DRAWN AEROBIC AND ANAEROBIC Blood Culture adequate volume   Culture   Final    NO GROWTH 2 DAYS Performed at [facility], [address], [place], [place] [address]    Report Status PENDING  Incomplete  Culture, blood (routine x 2)     Status: None (Preliminary result)   Collection Time: 12/10/20  5:32 PM   Specimen: BLOOD  Result Value Ref Range Status   Specimen Description BLOOD SITE NOT SPECIFIED  Final   Special Requests   Final    BOTTLES DRAWN AEROBIC AND ANAEROBIC Blood Culture results may not be optimal due to an inadequate volume of blood received in culture bottles   Culture   Final    NO GROWTH 1 DAY Performed at [facility], [address] [place], [place] [address]    Report Status PENDING  Incomplete  SARS CORONAVIRUS 2 (TAT 6-24 HRS) Nasopharyngeal Nasopharyngeal Swab     Status: None   Collection Time: 12/10/20  6:52 PM   Specimen: Nasopharyngeal Swab  Result Value Ref Range Status   SARS Coronavirus 2 NEGATIVE NEGATIVE Final    Comment: (NOTE) SARS-CoV-2 target nucleic acids are NOT DETECTED.  The SARS-CoV-2 RNA is generally detectable in upper and lower respiratory specimens during the acute phase of infection. Negative results do not preclude SARS-CoV-2 infection, do not rule out co-infections with other pathogens, and should not be  used as the sole basis for treatment or other patient management decisions. Negative results must be combined with clinical observations, patient history, and epidemiological information. The expected result is Negative.  Fact Sheet for Patients: HairSlick.no  Fact Sheet for Healthcare Providers: quierodirigir.com  This test is not yet approved or cleared by the [place] FDA and  has been authorized for detection and/or diagnosis of SARS-CoV-2 by FDA under an Emergency Use Authorization (EUA). This EUA will remain  in effect (meaning this test can be used) for the duration of the COVID-19 declaration under Se ction 564(b)(1) of the Act, 21 U.S.C. section 360bbb-3(b)(1), unless the authorization is terminated or revoked sooner.  Performed at [facility], [address], [place], [place] [address]   Urine culture     Status: Abnormal   Collection Time: 12/10/20  7:55 PM   Specimen: Urine, Random  Result Value Ref Range Status   Specimen Description URINE, RANDOM  Final   Special Requests NONE  Final   Culture (A)  Final    <10,000 COLONIES/mL INSIGNIFICANT GROWTH Performed at [facility], [address] [place], [place] [address]    Report Status 12/12/2020 FINAL  Final     Radiology Studies: MR BRAIN WO CONTRAST  Result Date: 12/12/2020 CLINICAL DATA:  Provided history: Mental status change, unknown cause. Altered mental status. EXAM: MRI HEAD WITHOUT CONTRAST TECHNIQUE: Multiplanar, multiecho pulse sequences of the brain and surrounding structures were obtained without intravenous contrast. COMPARISON:  Prior head CT examinations 12/10/2020 and earlier. FINDINGS: Brain: Mild intermittent motion degradation. Mild cerebral and cerebellar atrophy. Redemonstrated chronic hemorrhagic infarct within the left thalamus. Additional chronic lacunar infarcts again demonstrated within the bilateral basal ganglia and  cerebellar hemispheres. Background advanced multifocal T2/FLAIR hyperintensity within the cerebral white matter, nonspecific but compatible with chronic small vessel ischemic disease. To a lesser degree, chronic small vessel ischemic changes are also present within  the pons. As before, there are scattered supratentorial and infratentorial chronic microhemorrhages. There is no acute infarct. No evidence of intracranial mass. No extra-axial fluid collection. No midline shift. Vascular: Expected proximal arterial flow voids. Skull and upper cervical spine: No focal marrow lesion. Incompletely assessed cervical spondylosis Sinuses/Orbits: Abnormal appearance of the right globe with abnormal T2 intermediate signal and T1 hyperintense signal within the mid to posterior aspect of the globe. Trace bilateral ethmoid and right maxillary sinus mucosal thickening. IMPRESSION: Mildly motion degraded exam. No evidence of acute intracranial abnormality. Stable non-contrast MRI appearance of the brain as compared to 02/05/2020. Redemonstrated chronic hemorrhagic infarct within the left thalamus. Chronic lacunar infarcts within the bilateral basal ganglia and cerebellar hemispheres. Background advanced chronic small vessel ischemic disease. Multiple supratentorial and infratentorial chronic microhemorrhages. Findings may reflect sequela of hypertensive microangiopathy and/or cerebral amyloid angiopathy. Mild generalized parenchymal atrophy. As before, there is an abnormal appearance of the right globe. T2 intermediate and T1 hyperintense signal abnormality within the mid to posterior aspect of the right globe. Findings may reflect sequela of retinal or choroid detachment. Given the appearance, an orbital mass is considered less likely, although not excluded. Ophthalmological consultation recommended if not already obtained. Electronically Signed   By: Jackey Loge DO   On: 12/12/2020 10:49    Pamella Pert, MD, PhD [facility]  Between 7 am - 7 pm I am available, please contact me via Amion (for emergencies) or Securechat (non urgent messages)  Between 7 pm - 7 am I am not available, please contact night coverage MD/APP via Amion

## 2020-12-13 NOTE — Evaluation (Signed)
Physical Therapy Evaluation Patient Details Name: Steven Kent MRN: [id number] DOB: 1935/01/24 Today's Date: 12/13/2020   History of Present Illness  86 year old male with history of chronic leg edema, chronic right leg ulcer, hypertension, AAA, prior CVA with residual right-sided weakness was brought into the ED for right leg wound, nonhealing, and has been having more difficulties moving his right leg and has been so weak that he was unable to ambulate over the last few days leading up to admission; Wife reports incr confusion and hallucinations, which are NOT his baseline.  Clinical Impression   Pt admitted with above diagnosis. Comes from home, where he lives with his wife, daughter, and granddaughter; walks short household distance at baseline, and noted in H&P is not confused at baseline; Presentst to PT with decr functional mobility, imparied tone, noted non-healing wounds, residual R sided hemiparesis; Currently requires max assist for bed mobiltiy, and +2 assist for standing and gait trials;  Pt currently with functional limitations due to the deficits listed below (see PT Problem List). Pt will benefit from skilled PT to increase their independence and safety with mobility to allow discharge to the venue listed below.       Follow Up Recommendations SNF    Equipment Recommendations  Other (comment) (to be determined, but I believe he is pretty well-equipped)    Recommendations for Other Services       Precautions / Restrictions Precautions Precautions: Fall Precaution Comments: Very painful RLE Restrictions Weight Bearing Restrictions: No      Mobility  Bed Mobility Overal bed mobility: Needs Assistance Bed Mobility: Rolling;Supine to Sit;Sit to Supine Rolling: Max assist   Supine to sit: Mod assist Sit to supine: Max assist   General bed mobility comments: Rolled r and L for pad placement and hygeine at end of session; Heavy mod assist to come to sit; Pt asks  to try to perform by himself, and rotated his body (helicopter-like) to be perpendicular to the bed before trying to push up; Assist to elevate trunk to sit    Transfers Overall transfer level: Needs assistance Equipment used: Rolling walker (2 wheeled) Transfers: Sit to/from Stand Sit to Stand: Max assist         General transfer comment: Attempted sit to stand without success from elevated bed; put my foot in front of pt's L foot to stabilize as his L foot tended to slide forward  Ambulation/Gait                Stairs            Wheelchair Mobility    Modified Rankin (Stroke Patients Only)       Balance                                             Pertinent Vitals/Pain Pain Assessment: Faces Faces Pain Scale: Hurts whole lot Pain Location: R lower leg, especially donning large sock, and with touch/support given at ankle Pain Descriptors / Indicators: Grimacing (Heavily) Pain Intervention(s): Monitored during session;Repositioned;Other (comment) (and will continue assessing to figure out best way to contact his RLE for support)    Home Living Family/patient expects to be discharged to:: Private residence Living Arrangements: Spouse/significant other Available Help at Discharge: Family Type of Home: House Home Access: Level entry     Home Layout: One level Home Equipment: Environmental consultant - 2 wheels;Cane -  quad;Shower seat;Wheelchair - manual;Bedside commode      Prior Function Level of Independence: Needs assistance   Gait / Transfers Assistance Needed: Walks short distances with RW  ADL's / Homemaking Assistance Needed: Pt reports he can bathe and dress himself -- but then he tells me he hasn't been able to change his socks  Comments: Not entirely relaible historian     Hand Dominance        Extremity/Trunk Assessment   Upper Extremity Assessment Upper Extremity Assessment: RUE deficits/detail RUE Deficits / Details: with residual  deficits and hemiparesis from previous CVA; noted incr tone in hand, but able to get fingers open with gentle manual assist and massage -- which pt showed me; noted tremor while he used his L UE to place his R hand on RW; upper arm wound dressed    Lower Extremity Assessment Lower Extremity Assessment: RLE deficits/detail RLE Deficits / Details: lower leg wounds dressed and wrapped with ace wrap for protection; Notably incr flexor tone, with tendency to hold his RLE in near end-range knee flexion while lying in bed; some easing of knee flexion with gentle ROM and mobility, and when sitting EOB; Difficulty finding tolerable hand positioning for ROM due to pain       Communication   Communication: No difficulties  Cognition Arousal/Alertness: Awake/alert Behavior During Therapy: WFL for tasks assessed/performed Overall Cognitive Status: No family/caregiver present to determine baseline cognitive functioning Area of Impairment: Safety/judgement                         Safety/Judgement: Decreased awareness of deficits     General Comments: Slow to answer questions at times, tangential at times      General Comments General comments (skin integrity, edema, etc.): Pt tended to talk a lot, and while he wanted to try to perform mobility tasks without assist, I also go the notion he was stalling; increased time throughout eval, and pt was very slow moving    Exercises     Assessment/Plan    PT Assessment Patient needs continued PT services  PT Problem List Decreased strength;Decreased range of motion;Decreased activity tolerance;Decreased balance;Decreased mobility;Decreased coordination;Decreased cognition;Decreased knowledge of use of DME;Decreased safety awareness;Decreased knowledge of precautions;Pain;Impaired tone;Decreased skin integrity       PT Treatment Interventions DME instruction;Gait training;Functional mobility training;Therapeutic activities;Therapeutic  exercise;Balance training;Neuromuscular re-education;Patient/family education    PT Goals (Current goals can be found in the Care Plan section)  Acute Rehab PT Goals Patient Stated Goal: wants to be home to play with his granddaughter PT Goal Formulation: With patient Time For Goal Achievement: 12/27/20 Potential to Achieve Goals: Fair    Frequency Min 2X/week   Barriers to discharge        Co-evaluation               AM-PAC PT "6 Clicks" Mobility  Outcome Measure Help needed turning from your back to your side while in a flat bed without using bedrails?: A Lot Help needed moving from lying on your back to sitting on the side of a flat bed without using bedrails?: A Lot Help needed moving to and from a bed to a chair (including a wheelchair)?: A Lot Help needed standing up from a chair using your arms (e.g., wheelchair or bedside chair)?: A Lot Help needed to walk in hospital room?: Total Help needed climbing 3-5 steps with a railing? : Total 6 Click Score: 10    End of Session Equipment  Utilized During Treatment: Gait belt Activity Tolerance: Patient tolerated treatment well Patient left: in bed;with call bell/phone within reach;with bed alarm set;Other (comment) (Attemtped to get bed close to semi-cahir position) Nurse Communication: Mobility status PT Visit Diagnosis: Other abnormalities of gait and mobility (R26.89);Repeated falls (R29.6);Muscle weakness (generalized) (M62.81);Hemiplegia and hemiparesis Hemiplegia - Right/Left: Right Hemiplegia - dominant/non-dominant: Dominant Hemiplegia - caused by: Cerebral infarction    Time: 2130-8657 PT Time Calculation (min) (ACUTE ONLY): 53 min   Charges:   PT Evaluation $PT Eval Moderate Complexity: 1 Mod PT Treatments $Therapeutic Activity: 38-52 mins        Van Clines, PT  Acute Rehabilitation Services Pager [phone] Office [phone]   Levi Aland 12/13/2020, 1:21 PM

## 2020-12-14 ENCOUNTER — Ambulatory Visit: Payer: Medicare Other | Admitting: Family Medicine

## 2020-12-14 ENCOUNTER — Telehealth: Payer: Self-pay | Admitting: *Deleted

## 2020-12-14 LAB — CBC
HCT: 38.6 % — ABNORMAL LOW (ref 39.0–52.0)
Hemoglobin: 12.6 g/dL — ABNORMAL LOW (ref 13.0–17.0)
MCH: 31.7 pg (ref 26.0–34.0)
MCHC: 32.6 g/dL (ref 30.0–36.0)
MCV: 97 fL (ref 80.0–100.0)
Platelets: 160 10*3/uL (ref 150–400)
RBC: 3.98 MIL/uL — ABNORMAL LOW (ref 4.22–5.81)
RDW: 14.6 % (ref 11.5–15.5)
WBC: 6.2 10*3/uL (ref 4.0–10.5)
nRBC: 0 % (ref 0.0–0.2)

## 2020-12-14 LAB — BASIC METABOLIC PANEL
Anion gap: 7 (ref 5–15)
BUN: 15 mg/dL (ref 8–23)
CO2: 28 mmol/L (ref 22–32)
Calcium: 8.1 mg/dL — ABNORMAL LOW (ref 8.9–10.3)
Chloride: 105 mmol/L (ref 98–111)
Creatinine, Ser: 1.2 mg/dL (ref 0.61–1.24)
GFR, Estimated: >60 mL/min (ref >60)
Glucose, Bld: 92 mg/dL (ref 70–99)
Potassium: 3.4 mmol/L — ABNORMAL LOW (ref 3.5–5.1)
Sodium: 140 mmol/L (ref 135–145)

## 2020-12-14 LAB — CUP PACEART REMOTE DEVICE CHECK
Date Time Interrogation Session: 20220514082945
Implantable Pulse Generator Implant Date: 20211027

## 2020-12-14 MED ORDER — POTASSIUM CHLORIDE CRYS ER 20 MEQ PO TBCR
40.0000 meq | EXTENDED_RELEASE_TABLET | Freq: Once | ORAL | Status: AC
  Administered 2020-12-14: 40 meq via ORAL
  Filled 2020-12-14: qty 2

## 2020-12-14 NOTE — Progress Notes (Signed)
PROGRESS NOTE  Steven Kent HBZ:[id number] DOB: 1934-09-06 DOA: 12/10/2020 PCP: Ardith Dark, MD   LOS: 2 days   Brief Narrative / Interim history: 86 year old male with history of chronic leg edema, chronic right leg ulcer, hypertension, AAA, prior CVA with residual right-sided weakness was brought into the ED for right leg wound, nonhealing.  Patient tells me that he has been having more difficulties moving his right leg and has been so weak that he was unable to ambulate over the last few days.  I also discussed with the wife over the phone.  She tells me that over the last 3 to 4 days patient indeed has been nonambulatory because he is unable to move his right leg.  She also reports that he has been more confused and having intermittent hallucinations which is very unusual for him, normally he is alert and orient x4.  They initially called EMS to assist with recommendation from the wheelchair to bed, however on EMS evaluation he was disoriented to time, place, situation and brought to the hospital.  Subjective / 24h Interval events: Eating breakfast. No complaints.   Assessment & Plan: Principal Problem Right leg wound, cellulitis -patient was not septic on admission but there was concern for an infectious process given confusion.  He was started on vancomycin and cefepime, blood cultures have remained negative. Continue doxycycline plan for 7 days   Active Problems Acute metabolic encephalopathy /generalized weakness-patient with confusion at home, hallucinations, concern for infectious process versus other.  Seems to be clear this morning, wife agrees that he has improved with antibiotics to treat #1.  MRI of the brain was negative for acute strokes.  PT recommends SNF to which wife and patient agreeable.  Social worker consulted  Hypokalemia-replete again today  Essential hypertension -continue torsemide  Chronic kidney disease stage III -Baseline around 1.2 - 1.5, creatinine  at baseline  Pulmonary vascular congestion on chest x-ray, chronic lower extremity swelling, chronic diastolic CHF-continue torsemide, on room air  Mild rhabdomyolysis -stable, CK improving  Prior CVA with right-sided hemiparesis -patient at baseline can ambulate but has been unable to do so in the last 3 to 4 days, his right side appears to be much weaker.  MRI of the brain negative.  SNF pending  Hyperlipidemia -resume home statin  Prior recurrent VTE -continue Xarelto  Scheduled Meds: . atorvastatin  40 mg Oral Daily  . brimonidine  1 drop Both Eyes Daily  . dorzolamide  1 drop Both Eyes BID  . gabapentin  400 mg Oral BID  . latanoprost  1 drop Both Eyes QHS  . oxybutynin  5 mg Oral BID  . rivaroxaban  20 mg Oral Q supper  . torsemide  20 mg Oral Daily   Continuous Infusions: . sodium chloride Stopped (12/14/20 0400)  . doxycycline (VIBRAMYCIN) IV 100 mg (12/14/20 1029)   PRN Meds:.sodium chloride, acetaminophen **OR** acetaminophen, ondansetron **OR** ondansetron (ZOFRAN) IV  Diet Orders (From admission, onward)    Start     Ordered   12/11/20 0209  Diet regular Room service appropriate? Yes; Fluid consistency: Thin  Diet effective now       Question Answer Comment  Room service appropriate? Yes   Fluid consistency: Thin      12/11/20 0208          DVT prophylaxis: rivaroxaban (XARELTO) tablet 20 mg Start: 12/11/20 1700 SCDs Start: 12/11/20 0246 rivaroxaban (XARELTO) tablet 20 mg     Code Status: Full Code  Family  Communication: No family at bedside  Status is: Inpatient  Altered mental status  Dispo: The patient is from: Home              Anticipated d/c is to: Home              Patient currently is not medically stable to d/c.   Difficult to place patient No  Level of care: Telemetry Medical  Consultants:  None  Procedures:  none  Microbiology  Blood cultures 5/12-no growth  Antimicrobials: VAC cefepime 5/13 >>   Objective: Vitals:    12/13/20 1650 12/13/20 2043 12/14/20 0448 12/14/20 0957  BP: 119/63 130/72 (!) 125/59 128/70  Pulse: 71 84 75 76  Resp: 18 17 18 18   Temp: 98.4 F (36.9 C) 99.5 F (37.5 C) 98.6 F (37 C) 98.7 F (37.1 C)  TempSrc: Oral Oral Oral Oral  SpO2: 100% 98% 98% 98%  Weight:      Height:        Intake/Output Summary (Last 24 hours) at 12/14/2020 1051 Last data filed at 12/14/2020 0900 Gross per 24 hour  Intake 1327.03 ml  Output 2600 ml  Net -1272.97 ml   Filed Weights   12/11/20 1119  Weight: 84.5 kg    Examination:  Constitutional: NAD Eyes: No icterus ENMT: mmm Neck: normal, supple Respiratory: Clear bilaterally, no wheezing Cardiovascular: Regular rate and rhythm, 1+ edema Abdomen: Nondistended, no tenderness Musculoskeletal: no clubbing / cyanosis.  Skin: Chronic venous stasis changes bilaterally.  Wound pictured as below Neurologic: No new focal deficits      Data Reviewed: I have independently reviewed following labs and imaging studies   CBC: Recent Labs  Lab 12/10/20 1727 12/11/20 0807 12/13/20 0431 12/14/20 0350  WBC 7.2 7.2 6.7 6.2  NEUTROABS 5.2 5.3  --   --   HGB 13.8 12.6* 12.3* 12.6*  HCT 45.2 39.4 37.5* 38.6*  MCV 103.7* 98.5 95.9 97.0  PLT 168 163 155 160   Basic Metabolic Panel: Recent Labs  Lab 12/10/20 1727 12/11/20 0807 12/13/20 0431 12/14/20 0350  NA 137 141 139 140  K 4.1 3.8 3.2* 3.4*  CL 104 107 105 105  CO2 23 26 28 28   GLUCOSE 110* 92 118* 92  BUN 22 20 18 15   CREATININE 1.41* 1.27* 1.22 1.20  CALCIUM 8.5* 8.1* 7.9* 8.1*  MG  --   --  1.9  --   PHOS  --   --  3.0  --    Liver Function Tests: Recent Labs  Lab 12/10/20 1727 12/13/20 0431  AST 46* 38  ALT 17 20  ALKPHOS 58 44  BILITOT 1.2 0.8  PROT 7.2 6.2*  ALBUMIN 3.1* 2.3*   Coagulation Profile: No results for input(s): INR, PROTIME in the last 168 hours. HbA1C: Recent Labs    12/12/20 0945  HGBA1C 5.9*   CBG: Recent Labs  Lab 12/11/20 2138  GLUCAP  101*    Recent Results (from the past 240 hour(s))  Culture, blood (routine x 2)     Status: None (Preliminary result)   Collection Time: 12/10/20  5:32 PM   Specimen: BLOOD LEFT HAND  Result Value Ref Range Status   Specimen Description BLOOD LEFT HAND  Final   Special Requests   Final    BOTTLES DRAWN AEROBIC AND ANAEROBIC Blood Culture adequate volume   Culture   Final    NO GROWTH 3 DAYS Performed at [facility], [address], [place], [facility]  [address]    Report Status PENDING  Incomplete  Culture, blood (routine x 2)     Status: None (Preliminary result)   Collection Time: 12/10/20  5:32 PM   Specimen: BLOOD  Result Value Ref Range Status   Specimen Description BLOOD SITE NOT SPECIFIED  Final   Special Requests   Final    BOTTLES DRAWN AEROBIC AND ANAEROBIC Blood Culture results may not be optimal due to an inadequate volume of blood received in culture bottles   Culture   Final    NO GROWTH 2 DAYS Performed at [facility], [address], [place], [place] [address]    Report Status PENDING  Incomplete  SARS CORONAVIRUS 2 (TAT 6-24 HRS) Nasopharyngeal Nasopharyngeal Swab     Status: None   Collection Time: 12/10/20  6:52 PM   Specimen: Nasopharyngeal Swab  Result Value Ref Range Status   SARS Coronavirus 2 NEGATIVE NEGATIVE Final    Comment: (NOTE) SARS-CoV-2 target nucleic acids are NOT DETECTED.  The SARS-CoV-2 RNA is generally detectable in upper and lower respiratory specimens during the acute phase of infection. Negative results do not preclude SARS-CoV-2 infection, do not rule out co-infections with other pathogens, and should not be used as the sole basis for treatment or other patient management decisions. Negative results must be combined with clinical observations, patient history, and epidemiological information. The expected result is Negative.  Fact Sheet for Patients: HairSlick.no  Fact Sheet for  Healthcare Providers: quierodirigir.com  This test is not yet approved or cleared by the [place] FDA and  has been authorized for detection and/or diagnosis of SARS-CoV-2 by FDA under an Emergency Use Authorization (EUA). This EUA will remain  in effect (meaning this test can be used) for the duration of the COVID-19 declaration under Se ction 564(b)(1) of the Act, 21 U.S.C. section 360bbb-3(b)(1), unless the authorization is terminated or revoked sooner.  Performed at [facility], [address] [place], [place] [address]   Urine culture     Status: Abnormal   Collection Time: 12/10/20  7:55 PM   Specimen: Urine, Random  Result Value Ref Range Status   Specimen Description URINE, RANDOM  Final   Special Requests NONE  Final   Culture (A)  Final    <10,000 COLONIES/mL INSIGNIFICANT GROWTH Performed at [facility], [address] [place], [place] [address]    Report Status 12/12/2020 FINAL  Final     Radiology Studies: No results found.  Pamella Pert, MD, PhD [facility]  Between 7 am - 7 pm I am available, please contact me via Amion (for emergencies) or Securechat (non urgent messages)  Between 7 pm - 7 am I am not available, please contact night coverage MD/APP via Amion

## 2020-12-14 NOTE — TOC Initial Note (Signed)
Transition of Care [facility]) - Initial/Assessment Note    Patient Details  Name: Steven Kent MRN: [id number] Date of Birth: August 22, 1934  Transition of Care [facility]) CM/SW Contact:    Cristobal Goldmann, LCSW Phone Number: 12/14/2020, 5:38 PM  Clinical Narrative: Talked with wife by phone regarding patient's discharge disposition and recommendation of ST rehab. Spoke with wife as patient not fully oriented at this time. When asked, Mrs. Bronaugh reported that patient has been in a SNF before in [place] for a year. At that time they were separated and he lived with his family. She and her daughter eventually went to get him and brought him to [place] to live with them. Mrs. Swoboda explained that she and patient were divorced for 7 years and remarried.  Mrs. Wyles informed CSW that she is aware that her husband needs ST rehab and has already told him that in order to come back home, he will have to go to ST rehab. Wife explained that she is 86 years old and cannot care for him at this time due to her own health issues. Mrs. Nevers added that she does not think he really understands, but added that he will be going to a facility for rehab. When asked, Mrs. Trani indicated that patient wears glasses but can't see well, and added that he is blind in his right eye. Facility search process explained and wife informed regarding Medicare.gov for getting ratings and other information about SNF's. Mrs. Rigel indicated that she will the information to her daughter.                Expected Discharge Plan: Skilled Nursing Facility Barriers to Discharge: Other (comment) (Sending out patient info to SNF's, a SNF being chosen, insurance auth)   Patient Goals and CMS Choice Patient states their goals for this hospitalization and ongoing recovery are:: Wife agreeable to ST rehab for patient CMS Medicare.gov Compare Post Acute Care list provided to:: Patient Represenative (must comment) (Wife informed  regarding [email or web address] Choice offered to / list presented to : Spouse (Talked with wife about [email or web address] She will have daughter access website.)  Expected Discharge Plan and Services Expected Discharge Plan: Skilled Nursing Facility In-house Referral: Clinical Social Work     Living arrangements for the past 2 months: Apartment                                     Prior Living Arrangements/Services Living arrangements for the past 2 months: Apartment Lives with:: Adult Children,Spouse Patient language and need for interpreter reviewed:: No Do you feel safe going back to the place where you live?: No   Wife in agreement with ST rehab  Need for Family Participation in Patient Care: Yes (Comment) Care giver support system in place?: Yes (comment)   Criminal Activity/Legal Involvement Pertinent to Current Situation/Hospitalization: No - Comment as needed  Activities of Daily Living      Permission Sought/Granted Permission sought to share information with : Other (comment) (Patient not totlaly oriented, wife contacted)                Emotional Assessment Appearance:: Appears stated age Micah Flesher to room to determine if wife present) Attitude/Demeanor/Rapport: Unable to Assess Affect (typically observed): Unable to Assess Orientation: : Oriented to Self,Oriented to Place Alcohol / Substance Use: Tobacco Use,Alcohol Use,Illicit Drugs (Per H&P patient quit smoking and does not drink or use illicit drugs) Psych  Involvement: No (comment)  Admission diagnosis:  Somnolence [R40.0] Wound infection [T14.8XXA, L08.9] Elevated CK [R74.8] Cellulitis of leg, right [L03.115] AMS (altered mental status) [R41.82] Patient Active Problem List   Diagnosis Date Noted  . Pressure injury of skin 12/13/2020  . Cellulitis of leg, right 12/11/2020  . Pulmonary vascular congestion 12/11/2020  . Rhabdomyolysis 12/11/2020  . Lactic acid acidosis 12/11/2020  . Essential hypertension  08/16/2018  . Hypertensive kidney disease with chronic kidney disease stage III 08/16/2018  . VTE (venous thromboembolism) 08/16/2018  . Peripheral neuropathy 08/16/2018  . Glaucoma 08/16/2018  . Onychomycosis 08/16/2018  . Leg edema 08/16/2018  . Hyperlipidemia 08/16/2018  . Overactive bladder 08/16/2018  . Coronary artery disease involving native heart without angina pectoris 08/16/2018  . Hemiparesis affecting right side as late effect of cerebrovascular accident (HCC)   . Abdominal aortic aneurysm (AAA) (HCC)    PCP:  Ardith Dark, MD Pharmacy:   [facility] [phone] - [place], [place] - [address] AT [facility] [address] & [facility] [address] [place] [address] Phone: [phone] Fax: [phone]     Social Determinants of Health (SDOH) Interventions  No SDOH interventions requested or needed at this time.  Readmission Risk Interventions No flowsheet data found.

## 2020-12-14 NOTE — Telephone Encounter (Signed)
Patient's wife is calling because he has been hospitalized and she  does not know when he may be released. Please cancel upcoming appointment.

## 2020-12-14 NOTE — Plan of Care (Signed)
°  Problem: Coping: °Goal: Level of anxiety will decrease °Outcome: Progressing °  °

## 2020-12-14 NOTE — Discharge Instructions (Signed)
Information on my medicine - XARELTO (rivaroxaban)  This medication education was reviewed with me or my healthcare representative as part of my discharge preparation.  The pharmacist that spoke with me during my hospital stay was:    WHY WAS XARELTO PRESCRIBED FOR YOU? Xarelto was prescribed to treat blood clots that may have been found in the veins of your legs (deep vein thrombosis) or in your lungs (pulmonary embolism) and to reduce the risk of them occurring again.  What do you need to know about Xarelto? Continue Xarelto 20mg ONCE A DAY with your evening meal.  DO NOT stop taking Xarelto without talking to the health care provider who prescribed the medication.  Refill your prescription for 20 mg tablets before you run out.  After discharge, you should have regular check-up appointments with your healthcare provider that is prescribing your Xarelto.  In the future your dose may need to be changed if your kidney function changes by a significant amount.  What do you do if you miss a dose? If you are taking Xarelto TWICE DAILY and you miss a dose, take it as soon as you remember. You may take two 15 mg tablets (total 30 mg) at the same time then resume your regularly scheduled 15 mg twice daily the next day.  If you are taking Xarelto ONCE DAILY and you miss a dose, take it as soon as you remember on the same day then continue your regularly scheduled once daily regimen the next day. Do not take two doses of Xarelto at the same time.   Important Safety Information Xarelto is a blood thinner medicine that can cause bleeding. You should call your healthcare provider right away if you experience any of the following: ? Bleeding from an injury or your nose that does not stop. ? Unusual colored urine (red or dark brown) or unusual colored stools (red or black). ? Unusual bruising for unknown reasons. ? A serious fall or if you hit your head (even if there is no bleeding).  Some  medicines may interact with Xarelto and might increase your risk of bleeding while on Xarelto. To help avoid this, consult your healthcare provider or pharmacist prior to using any new prescription or non-prescription medications, including herbals, vitamins, non-steroidal anti-inflammatory drugs (NSAIDs) and supplements.  This website has more information on Xarelto: www.xarelto.com.  

## 2020-12-15 ENCOUNTER — Ambulatory Visit: Payer: Medicare Other | Admitting: Podiatry

## 2020-12-15 LAB — CBC
HCT: 41.8 % (ref 39.0–52.0)
Hemoglobin: 13.8 g/dL (ref 13.0–17.0)
MCH: 31.9 pg (ref 26.0–34.0)
MCHC: 33 g/dL (ref 30.0–36.0)
MCV: 96.8 fL (ref 80.0–100.0)
Platelets: 195 10*3/uL (ref 150–400)
RBC: 4.32 MIL/uL (ref 4.22–5.81)
RDW: 14.4 % (ref 11.5–15.5)
WBC: 7.4 10*3/uL (ref 4.0–10.5)
nRBC: 0 % (ref 0.0–0.2)

## 2020-12-15 LAB — CULTURE, BLOOD (ROUTINE X 2)
Culture: NO GROWTH
Special Requests: ADEQUATE

## 2020-12-15 LAB — BASIC METABOLIC PANEL
Anion gap: 9 (ref 5–15)
BUN: 18 mg/dL (ref 8–23)
CO2: 27 mmol/L (ref 22–32)
Calcium: 8.6 mg/dL — ABNORMAL LOW (ref 8.9–10.3)
Chloride: 103 mmol/L (ref 98–111)
Creatinine, Ser: 1.19 mg/dL (ref 0.61–1.24)
GFR, Estimated: >60 mL/min (ref >60)
Glucose, Bld: 95 mg/dL (ref 70–99)
Potassium: 3.6 mmol/L (ref 3.5–5.1)
Sodium: 139 mmol/L (ref 135–145)

## 2020-12-15 MED ORDER — TRAMADOL HCL 50 MG PO TABS
25.0000 mg | ORAL_TABLET | Freq: Four times a day (QID) | ORAL | Status: DC | PRN
  Administered 2020-12-15 – 2020-12-18 (×2): 25 mg via ORAL
  Filled 2020-12-15 (×2): qty 1

## 2020-12-15 NOTE — Progress Notes (Signed)
PROGRESS NOTE  Steven Kent:[id number] DOB: Apr 21, 1935 DOA: 12/10/2020 PCP: Ardith Dark, MD   LOS: 3 days   Brief Narrative / Interim history: 87 year old male with history of chronic leg edema, chronic right leg ulcer, hypertension, AAA, prior CVA with residual right-sided weakness was brought into the ED for right leg wound, nonhealing.  Patient tells me that he has been having more difficulties moving his right leg and has been so weak that he was unable to ambulate over the last few days.  I also discussed with the wife over the phone.  She tells me that over the last 3 to 4 days patient indeed has been nonambulatory because he is unable to move his right leg.  She also reports that he has been more confused and having intermittent hallucinations which is very unusual for him, normally he is alert and orient x4.  They initially called EMS to assist with recommendation from the wheelchair to bed, however on EMS evaluation he was disoriented to time, place, situation and brought to the hospital.  Subjective / 24h Interval events: Right leg is hurting him a little bit more.  No chest pain, no shortness of breath.  Assessment & Plan: Principal Problem Right leg wound, cellulitis -patient was not septic on admission but there was concern for an infectious process given confusion.  He was started on vancomycin and cefepime, blood cultures have remained negative. Continue doxycycline, plan for 7 days today is day #4  Active Problems Acute metabolic encephalopathy /generalized weakness-patient with confusion at home, hallucinations, concern for infectious process versus other.  Seems to be clear this morning, wife agrees that he has improved with antibiotics to treat #1.  MRI of the brain was negative for acute strokes.  PT recommends SNF to which wife and patient agreeable.  Social worker consulted, placed needs to be picked up today and insurance authorization to start.  Hopefully DC  Wednesday  Hypokalemia-replete again today  Essential hypertension -continue torsemide  Chronic kidney disease stage III -Baseline around 1.2 - 1.5, creatinine at baseline this morning  Pulmonary vascular congestion on chest x-ray, chronic lower extremity swelling, chronic diastolic CHF-continue torsemide, on room air, clinically euvolemic  Mild rhabdomyolysis -stable, CK improving  Prior CVA with right-sided hemiparesis -patient at baseline can ambulate but has been unable to do so in the last 3 to 4 days, his right side appears to be much weaker.  MRI of the brain negative.  Short-term rehab SNF pending  Hyperlipidemia -resume home statin  Prior recurrent VTE -continue Xarelto  Scheduled Meds: . atorvastatin  40 mg Oral Daily  . brimonidine  1 drop Both Eyes Daily  . dorzolamide  1 drop Both Eyes BID  . gabapentin  400 mg Oral BID  . latanoprost  1 drop Both Eyes QHS  . oxybutynin  5 mg Oral BID  . rivaroxaban  20 mg Oral Q supper  . torsemide  20 mg Oral Daily   Continuous Infusions: . sodium chloride Stopped (12/14/20 0400)  . doxycycline (VIBRAMYCIN) IV 100 mg (12/14/20 2258)   PRN Meds:.sodium chloride, acetaminophen **OR** acetaminophen, ondansetron **OR** ondansetron (ZOFRAN) IV, traMADol  Diet Orders (From admission, onward)    Start     Ordered   12/11/20 0209  Diet regular Room service appropriate? Yes; Fluid consistency: Thin  Diet effective now       Question Answer Comment  Room service appropriate? Yes   Fluid consistency: Thin      12/11/20 0208  DVT prophylaxis: rivaroxaban (XARELTO) tablet 20 mg Start: 12/11/20 1700 SCDs Start: 12/11/20 0246 rivaroxaban (XARELTO) tablet 20 mg     Code Status: Full Code  Family Communication: No family at bedside, discussed with wife yesterday  Status is: Inpatient  Altered mental status  Dispo: The patient is from: Home              Anticipated d/c is to: Home              Patient currently is not  medically stable to d/c.   Difficult to place patient No  Level of care: Telemetry Medical  Consultants:  None  Procedures:  none  Microbiology  Blood cultures 5/12-no growth  Antimicrobials: VAC cefepime 5/13 >> 5/15 doxycycline 5/15   Objective: Vitals:   12/14/20 0957 12/14/20 1645 12/14/20 2027 12/15/20 0530  BP: 128/70 103/63 (!) 150/70 (!) 148/65  Pulse: 76 76 85 70  Resp: 18 17 16 16   Temp: 98.7 F (37.1 C) 98.7 F (37.1 C) 98.1 F (36.7 C) 98 F (36.7 C)  TempSrc: Oral Oral Oral Oral  SpO2: 98% 98% 96% 98%  Weight:      Height:        Intake/Output Summary (Last 24 hours) at 12/15/2020 1006 Last data filed at 12/15/2020 0845 Gross per 24 hour  Intake 1220 ml  Output 2000 ml  Net -780 ml   Filed Weights   12/11/20 1119  Weight: 84.5 kg    Examination:  Constitutional: No distress Eyes: No icterus ENMT: mmm Neck: normal, supple Respiratory: Clear to auscultation bilaterally, no wheezing Cardiovascular: Regular rate and rhythm, no murmurs, 1+ edema Abdomen: Nondistended, nontender, bowel sounds positive Musculoskeletal: no clubbing / cyanosis.  Skin: Chronic venous stasis changes bilaterally, wound with pink base, no drainage, no surrounding cellulitis Neurologic: Chronic right-sided weakness, no new focal deficits  Data Reviewed: I have independently reviewed following labs and imaging studies   CBC: Recent Labs  Lab 12/10/20 1727 12/11/20 0807 12/13/20 0431 12/14/20 0350 12/15/20 0424  WBC 7.2 7.2 6.7 6.2 7.4  NEUTROABS 5.2 5.3  --   --   --   HGB 13.8 12.6* 12.3* 12.6* 13.8  HCT 45.2 39.4 37.5* 38.6* 41.8  MCV 103.7* 98.5 95.9 97.0 96.8  PLT 168 163 155 160 195   Basic Metabolic Panel: Recent Labs  Lab 12/10/20 1727 12/11/20 0807 12/13/20 0431 12/14/20 0350 12/15/20 0424  NA 137 141 139 140 139  K 4.1 3.8 3.2* 3.4* 3.6  CL 104 107 105 105 103  CO2 23 26 28 28 27   GLUCOSE 110* 92 118* 92 95  BUN 22 20 18 15 18    CREATININE 1.41* 1.27* 1.22 1.20 1.19  CALCIUM 8.5* 8.1* 7.9* 8.1* 8.6*  MG  --   --  1.9  --   --   PHOS  --   --  3.0  --   --    Liver Function Tests: Recent Labs  Lab 12/10/20 1727 12/13/20 0431  AST 46* 38  ALT 17 20  ALKPHOS 58 44  BILITOT 1.2 0.8  PROT 7.2 6.2*  ALBUMIN 3.1* 2.3*   Coagulation Profile: No results for input(s): INR, PROTIME in the last 168 hours. HbA1C: No results for input(s): HGBA1C in the last 72 hours. CBG: Recent Labs  Lab 12/11/20 2138  GLUCAP 101*    Recent Results (from the past 240 hour(s))  Culture, blood (routine x 2)     Status: None   Collection Time:  12/10/20  5:32 PM   Specimen: BLOOD LEFT HAND  Result Value Ref Range Status   Specimen Description BLOOD LEFT HAND  Final   Special Requests   Final    BOTTLES DRAWN AEROBIC AND ANAEROBIC Blood Culture adequate volume   Culture   Final    NO GROWTH 5 DAYS Performed at [facility], [address], [place], [place] [address]    Report Status 12/15/2020 FINAL  Final  Culture, blood (routine x 2)     Status: None (Preliminary result)   Collection Time: 12/10/20  5:32 PM   Specimen: BLOOD  Result Value Ref Range Status   Specimen Description BLOOD SITE NOT SPECIFIED  Final   Special Requests   Final    BOTTLES DRAWN AEROBIC AND ANAEROBIC Blood Culture results may not be optimal due to an inadequate volume of blood received in culture bottles   Culture   Final    NO GROWTH 4 DAYS Performed at [facility], [address], [place], [place] [address]    Report Status PENDING  Incomplete  SARS CORONAVIRUS 2 (TAT 6-24 HRS) Nasopharyngeal Nasopharyngeal Swab     Status: None   Collection Time: 12/10/20  6:52 PM   Specimen: Nasopharyngeal Swab  Result Value Ref Range Status   SARS Coronavirus 2 NEGATIVE NEGATIVE Final    Comment: (NOTE) SARS-CoV-2 target nucleic acids are NOT DETECTED.  The SARS-CoV-2 RNA is generally detectable in upper and lower respiratory specimens  during the acute phase of infection. Negative results do not preclude SARS-CoV-2 infection, do not rule out co-infections with other pathogens, and should not be used as the sole basis for treatment or other patient management decisions. Negative results must be combined with clinical observations, patient history, and epidemiological information. The expected result is Negative.  Fact Sheet for Patients: HairSlick.no  Fact Sheet for Healthcare Providers: quierodirigir.com  This test is not yet approved or cleared by the [place] FDA and  has been authorized for detection and/or diagnosis of SARS-CoV-2 by FDA under an Emergency Use Authorization (EUA). This EUA will remain  in effect (meaning this test can be used) for the duration of the COVID-19 declaration under Se ction 564(b)(1) of the Act, 21 U.S.C. section 360bbb-3(b)(1), unless the authorization is terminated or revoked sooner.  Performed at [facility], [address], [place], [place] [address]   Urine culture     Status: Abnormal   Collection Time: 12/10/20  7:55 PM   Specimen: Urine, Random  Result Value Ref Range Status   Specimen Description URINE, RANDOM  Final   Special Requests NONE  Final   Culture (A)  Final    <10,000 COLONIES/mL INSIGNIFICANT GROWTH Performed at [facility], [address], [place], [place] [address]    Report Status 12/12/2020 FINAL  Final     Radiology Studies: No results found.  Pamella Pert, MD, PhD [facility]  Between 7 am - 7 pm I am available, please contact me via Amion (for emergencies) or Securechat (non urgent messages)  Between 7 pm - 7 am I am not available, please contact night coverage MD/APP via Amion

## 2020-12-15 NOTE — NC FL2 (Signed)
[place] MEDICAID FL2 LEVEL OF CARE SCREENING TOOL     IDENTIFICATION  Patient Name: Steven Kent Birthdate: November 08, 1934 Sex: male Admission Date (Current Location): 12/10/2020  [facility] and [place] Number:  Producer, television/film/video and Address:  [facility] [place], [address] [place], [place] [address]      Provider Number: [id number]  Attending Physician Name and Address:  Leatha Gilding, MD  Relative Name and Phone Number:  Zayde Stroupe - wife; [phone]    Current Level of Care: Hospital Recommended Level of Care: Skilled Nursing Facility Prior Approval Number:    Date Approved/Denied:   PASRR Number: [id number] (Eff. 12/15/20)  Discharge Plan: SNF    Current Diagnoses: Patient Active Problem List   Diagnosis Date Noted  . Pressure injury of skin 12/13/2020  . Cellulitis of leg, right 12/11/2020  . Pulmonary vascular congestion 12/11/2020  . Rhabdomyolysis 12/11/2020  . Lactic acid acidosis 12/11/2020  . Essential hypertension 08/16/2018  . Hypertensive kidney disease with chronic kidney disease stage III 08/16/2018  . VTE (venous thromboembolism) 08/16/2018  . Peripheral neuropathy 08/16/2018  . Glaucoma 08/16/2018  . Onychomycosis 08/16/2018  . Leg edema 08/16/2018  . Hyperlipidemia 08/16/2018  . Overactive bladder 08/16/2018  . Coronary artery disease involving native heart without angina pectoris 08/16/2018  . Hemiparesis affecting right side as late effect of cerebrovascular accident (HCC)   . Abdominal aortic aneurysm (AAA) (HCC)     Orientation RESPIRATION BLADDER Height & Weight     Self,Place,Situation  Normal Incontinent,External catheter (existing external catheter) Weight: 186 lb 4.6 oz (84.5 kg) Height:  6\' 2"  (188 cm)  BEHAVIORAL SYMPTOMS/MOOD NEUROLOGICAL BOWEL NUTRITION STATUS      Continent Diet (Regular diet)  AMBULATORY STATUS COMMUNICATION OF NEEDS Skin   Total Care (Patient was unable to ambulate  with PT during eval on 5/15) Verbally Skin abrasions,Other (Comment) (Right upper arm-partial thickness abrasion; right lower calf partial thickness abrasion; right upper posterior calf with stasis ulcer;stage 2 pressure injury left back-blister opened up; abrasion bilateral legs with foam dressing;)                       Personal Care Assistance Level of Assistance  Bathing,Feeding,Dressing Bathing Assistance: Maximum assistance Feeding assistance: Limited assistance (Assistance with set-up) Dressing Assistance: Maximum assistance     Functional Limitations Info  Sight,Hearing,Speech Sight Info: Impaired (Blind right eye) Hearing Info: Adequate Speech Info: Adequate    SPECIAL CARE FACTORS FREQUENCY  PT (By licensed PT),OT (By licensed OT)     PT Frequency: PT evaluated 5/15. PT at SNF eval and treat, a minimum of 5 days per week OT Frequency:  (OT eval pending)            Contractures Contractures Info: Present (Right upper extremity and right lower extremity)    Additional Factors Info  Code Status,Allergies Code Status Info: Full Allergies Info: Aspirin           Current Medications (12/15/2020):  This is the current hospital active medication list Current Facility-Administered Medications  Medication Dose Route Frequency Provider Last Rate Last Admin  . 0.9 %  sodium chloride infusion   Intravenous PRN 12/17/2020, MD   Stopped at 12/14/20 0400  . acetaminophen (TYLENOL) tablet 650 mg  650 mg Oral Q6H PRN 12/16/20, Na, MD   650 mg at 12/15/20 0809   Or  . acetaminophen (TYLENOL) suppository 650 mg  650 mg Rectal Q6H PRN 12/17/20,  Na, MD      . atorvastatin (LIPITOR) tablet 40 mg  40 mg Oral Daily Leatha Gilding, MD   40 mg at 12/15/20 0828  . brimonidine (ALPHAGAN) 0.15 % ophthalmic solution 1 drop  1 drop Both Eyes Daily Cathie Hoops, RPH   1 drop at 12/15/20 0829  . dorzolamide (TRUSOPT) 2 % ophthalmic solution 1 drop  1 drop Both Eyes BID Dierdre Searles, Na, MD   1  drop at 12/15/20 0829  . doxycycline (VIBRAMYCIN) 100 mg in sodium chloride 0.9 % 250 mL IVPB  100 mg Intravenous Q12H Leatha Gilding, MD 125 mL/hr at 12/14/20 2258 100 mg at 12/14/20 2258  . gabapentin (NEURONTIN) capsule 400 mg  400 mg Oral BID Dierdre Searles, Na, MD   400 mg at 12/15/20 0828  . latanoprost (XALATAN) 0.005 % ophthalmic solution 1 drop  1 drop Both Eyes QHS Dierdre Searles, Na, MD   1 drop at 12/14/20 2133  . ondansetron (ZOFRAN) tablet 4 mg  4 mg Oral Q6H PRN Dierdre Searles, Na, MD       Or  . ondansetron (ZOFRAN) injection 4 mg  4 mg Intravenous Q6H PRN Dierdre Searles, Na, MD   4 mg at 12/14/20 1815  . oxybutynin (DITROPAN) tablet 5 mg  5 mg Oral BID Dierdre Searles, Na, MD   5 mg at 12/15/20 0829  . rivaroxaban (XARELTO) tablet 20 mg  20 mg Oral Q supper Dierdre Searles, Na, MD   20 mg at 12/14/20 1650  . torsemide (DEMADEX) tablet 20 mg  20 mg Oral Daily Leatha Gilding, MD   20 mg at 12/15/20 0829  . traMADol (ULTRAM) tablet 25 mg  25 mg Oral Q6H PRN Leatha Gilding, MD         Discharge Medications: Please see discharge summary for a list of discharge medications.  Relevant Imaging Results:  Relevant Lab Results:   Additional Information ss#[id number].  Moderna vaccinations: 12/30/19 and 01/27/20. Skin infor. continued: Cellulitis right leg with gauze  Okey Dupre Lazaro Arms, LCSW

## 2020-12-15 NOTE — TOC Progression Note (Signed)
Transition of Care [facility]) - Progression Note    Patient Details  Name: NAI DASCH MRN: [id number] Date of Birth: Sep 28, 1934  Transition of Care [facility] CM/SW Contact  Okey Dupre Lazaro Arms, LCSW Phone Number: 12/15/2020, 4:56 PM  Clinical Narrative:  Initiated facility search this morning. Contacted Mrs. Fullington [phone]) regarding facility. She was provided with the SNF names during our conversation and emailed her - [email or web address] facility names and addresses. Also provided Medicare.gov web site to check into facility ratings and other information.     Expected Discharge Plan: Skilled Nursing Facility Barriers to Discharge: Other (comment) (Sending out patient info to SNF's, a SNF being chosen, Firefighter)  Expected Discharge Plan and Services Expected Discharge Plan: Skilled Nursing Facility In-house Referral: Clinical Social Work     Living arrangements for the past 2 months: Apartment                                     Social Determinants of Health (SDOH) Interventions  No SDOH interventions requested or needed at this time.   Readmission Risk Interventions No flowsheet data found.

## 2020-12-15 NOTE — Care Management Important Message (Signed)
Important Message  Patient Details  Name: KYLYN SOOKRAM MRN: [id number] Date of Birth: 05-Nov-1934   Medicare Important Message Given:  Yes     Zailynn Brandel P Mykel Sponaugle 12/15/2020, 10:44 AM

## 2020-12-15 NOTE — Evaluation (Signed)
Occupational Therapy Evaluation Patient Details Name: Steven Kent MRN: [id number] DOB: 09/23/1934 Today's Date: 12/15/2020    History of Present Illness 86 year old male with history of chronic leg edema, chronic right leg ulcer, hypertension, AAA, prior CVA with residual right-sided weakness was brought into the ED for right leg wound, nonhealing, and has been having more difficulties moving his right leg and has been so weak that he was unable to ambulate over the last few days leading up to admission; Wife reports incr confusion and hallucinations, which are NOT his baseline.   Clinical Impression   Pt admitted with the above diagnosis and has the deficits listed below. Pt would benefit from cont OT to increase independence with basic adl transfers and basic adls so he can eventually return home to live with his wife and daughter.  Pt is significantly limited by pain in his RLE and difficulty standing therefore is not safe to return home at this time. Feel pt needs SNF rehab to increase independence with adls and decrease the burden of care on his wife.  Pt currently needing +2 assist to mobilize. Pt is motivated and participates well.     Follow Up Recommendations  SNF;Supervision/Assistance - 24 hour    Equipment Recommendations  Other (comment) (tbd)    Recommendations for Other Services       Precautions / Restrictions Precautions Precautions: Fall Precaution Comments: Very painful RLE Restrictions Weight Bearing Restrictions: No      Mobility Bed Mobility Overal bed mobility: Needs Assistance Bed Mobility: Rolling;Supine to Sit;Sit to Supine Rolling: Max assist   Supine to sit: Mod assist Sit to supine: Total assist;+2 for physical assistance   General bed mobility comments: Pt able to come from supine to sit with mod assist.  Pt had to pull on therapists hand to bring weight foward to sit on EOB.    Transfers Overall transfer level: Needs  assistance Equipment used: Rolling walker (2 wheeled) Transfers: Sit to/from Stand Sit to Stand: Max assist;+2 physical assistance;From elevated surface (unable to get to full stand position)         General transfer comment: Pt attempted x2 to stand. B feet blocked (L more than R due to R foot pain). Pt would get 50% of way up and have to sit back down. Pt unable to straighten legs and fully stand.    Balance Overall balance assessment: Needs assistance Sitting-balance support: Single extremity supported;Feet supported Sitting balance-Leahy Scale: Fair Sitting balance - Comments: pt sat on EOB for appx 10 min with min assist to S for last 5 minutes. Postural control: Posterior lean Standing balance support: During functional activity;Bilateral upper extremity supported Standing balance-Leahy Scale: Zero Standing balance comment: Pt unabe to come to full standing position.  Pt got up 50% of the way before returning to sitting.                           ADL either performed or assessed with clinical judgement   ADL Overall ADL's : Needs assistance/impaired Eating/Feeding: Minimal assistance;Sitting   Grooming: Wash/dry hands;Wash/dry face;Oral care;Minimal assistance;Sitting;Bed level   Upper Body Bathing: Moderate assistance;Sitting;Bed level   Lower Body Bathing: Maximal assistance;+2 for physical assistance;Sitting/lateral leans;Cueing for compensatory techniques;Bed level   Upper Body Dressing : Maximal assistance;Sitting;Bed level   Lower Body Dressing: Total assistance;+2 for physical assistance;Sitting/lateral leans;Sit to/from stand;Cueing for compensatory techniques     Toilet Transfer Details (indicate cue type and reason): Pt unable to  stand or transfer at this time due to pain in RLE and weakness. Toileting- Clothing Manipulation and Hygiene: Total assistance;+2 for physical assistance;Bed level;Cueing for compensatory techniques Toileting - Clothing  Manipulation Details (indicate cue type and reason): rolled in bed. Bed pain. Attempted to stand at EOB. Pt got up 50% of the way but unable to tuck hips underneath him and extend knees to fully stand.     Functional mobility during ADLs: Maximal assistance General ADL Comments: Pt very limited by pain in RLE and inability to stand. Pt can get to EOB given about 15 minutes with min assist.  Pt moves very slowly. Pt felt he could stand.  With +2 assist pt stood 1/2 way on two attempts and returned to sitting as unable to tuck bottom, straighten legs and fully stand.     Vision Baseline Vision/History: Wears glasses Wears Glasses: At all times Patient Visual Report: No change from baseline       Perception Perception Perception Tested?: No   Praxis Praxis Praxis tested?: Within functional limits    Pertinent Vitals/Pain Pain Assessment: Faces Faces Pain Scale: Hurts whole lot Pain Location: RLE esp in ankle and great toe Pain Descriptors / Indicators: Grimacing Pain Intervention(s): Limited activity within patient's tolerance;Monitored during session;Repositioned     Hand Dominance Left (was R handed until CVA)   Extremity/Trunk Assessment Upper Extremity Assessment Upper Extremity Assessment: RUE deficits/detail RUE Deficits / Details: PROM: limited in shoulder to 100 degrees. AROM: limited in shoulder to 50 degrees. Pt with pain in R shoulder with movement. Pt massages hand to get it to open and put it around the walker. RUE: Unable to fully assess due to pain RUE Coordination: decreased fine motor;decreased gross motor   Lower Extremity Assessment Lower Extremity Assessment: Overall WFL for tasks assessed   Cervical / Trunk Assessment Cervical / Trunk Assessment: Normal   Communication Communication Communication: No difficulties   Cognition Arousal/Alertness: Awake/alert Behavior During Therapy: WFL for tasks assessed/performed Overall Cognitive Status: No  family/caregiver present to determine baseline cognitive functioning Area of Impairment: Safety/judgement;Awareness                         Safety/Judgement: Decreased awareness of deficits Awareness: Emergent   General Comments: Pt not consistent with answers about previous level of functioning.  Feels if he can go home, he will be fine there despite being unable to stand here.   General Comments  Pt most limited by pain in the RLE.  Pt keeps the knee and him fully flexed when laying in bed and requires a lot of time to stretch this leg out and attempt to stand.  Pt's RLE very painful with any movement or attempted weightbearing.    Exercises     Shoulder Instructions      Home Living Family/patient expects to be discharged to:: Private residence Living Arrangements: Spouse/significant other Available Help at Discharge: Family Type of Home: House Home Access: Level entry     Home Layout: One level     Bathroom Shower/Tub: Producer, television/film/video: Standard     Home Equipment: Environmental consultant - 2 wheels;Cane - quad;Shower seat;Wheelchair - manual;Bedside commode          Prior Functioning/Environment Level of Independence: Needs assistance  Gait / Transfers Assistance Needed: Walks short distances with RW ADL's / Homemaking Assistance Needed: Pt states he sponge bathes only with assist at times. pt states he can dress self (unsure of reliability)  Comments: Not entirely relaible historian        OT Problem List: Decreased strength;Decreased range of motion;Impaired balance (sitting and/or standing);Decreased cognition;Decreased safety awareness;Decreased knowledge of use of DME or AE;Decreased knowledge of precautions;Impaired tone;Impaired UE functional use;Pain      OT Treatment/Interventions: Self-care/ADL training;Neuromuscular education;Therapeutic activities    OT Goals(Current goals can be found in the care plan section) Acute Rehab OT  Goals Patient Stated Goal: wants to be home to play with his granddaughter OT Goal Formulation: With patient Time For Goal Achievement: 12/29/20 Potential to Achieve Goals: Good ADL Goals Pt Will Perform Grooming: with set-up;sitting Pt Will Perform Upper Body Dressing: with supervision;sitting Pt Will Perform Lower Body Dressing: sit to/from stand;with min assist Pt Will Transfer to Toilet: with mod assist;stand pivot transfer;bedside commode Additional ADL Goal #1: Pt will sit on EOB for 10 minutes with supervision while working on grooming tasks to increase sitting balance for EOB adls. Additional ADL Goal #2: Pt will stand at bed side with walker for 1 minute with min assist in prep for adls at EOB in standing.  OT Frequency: Min 2X/week   Barriers to D/C:    wife, daughter and granddaughter at home       Co-evaluation              AM-PAC OT "6 Clicks" Daily Activity     Outcome Measure Help from another person eating meals?: A Little Help from another person taking care of personal grooming?: A Little Help from another person toileting, which includes using toliet, bedpan, or urinal?: Total Help from another person bathing (including washing, rinsing, drying)?: A Lot Help from another person to put on and taking off regular upper body clothing?: A Lot Help from another person to put on and taking off regular lower body clothing?: A Lot 6 Click Score: 13   End of Session Equipment Utilized During Treatment: Rolling walker Nurse Communication: Mobility status  Activity Tolerance: Patient tolerated treatment well Patient left: in bed;with call bell/phone within reach;with bed alarm set  OT Visit Diagnosis: Unsteadiness on feet (R26.81);Other abnormalities of gait and mobility (R26.89);Other symptoms and signs involving the nervous system (R29.898);Hemiplegia and hemiparesis;Pain Hemiplegia - Right/Left: Right Hemiplegia - dominant/non-dominant: Dominant Hemiplegia -  caused by: Cerebral infarction Pain - Right/Left: Right Pain - part of body: Leg                Time: 1006-1045 OT Time Calculation (min): 39 min Charges:  OT General Charges $OT Visit: 1 Visit OT Evaluation $OT Eval Moderate Complexity: 1 Mod OT Treatments $Self Care/Home Management : 8-22 mins  Hope Budds 12/15/2020, 11:06 AM

## 2020-12-15 NOTE — Progress Notes (Signed)
Physical Therapy Treatment Patient Details Name: Steven Kent MRN: [id number] DOB: [birth year] Today's Date: 12/15/2020    History of Present Illness 86 year old male with history of chronic leg edema, chronic right leg ulcer, hypertension, AAA, prior CVA with residual right-sided weakness was brought into the ED for right leg wound, nonhealing, and has been having more difficulties moving his right leg and has been so weak that he was unable to ambulate over the last few days leading up to admission; Wife reports incr confusion and hallucinations, which are NOT his baseline.    PT Comments    Continuing work on functional mobility and activity tolerance;  Session focused on trying [facility] lift for a safe, stable option to help pt OOB to the recliner; During the process of getting up, he was at times painful, but overall tolerated the transfer well and seemed pleased with being OOB in the recliner; able to get to a more extended R knee position in the recliner; Lots of pillow to pad, position, and support pt and posture in recliner   Follow Up Recommendations  SNF     Equipment Recommendations  Other (comment) (to be determined, but I believe he is pretty well-equipped)    Recommendations for Other Services       Precautions / Restrictions Precautions Precautions: Fall Precaution Comments: Very painful RLE    Mobility  Bed Mobility Overal bed mobility: Needs Assistance Bed Mobility: Rolling Rolling: Mod assist         General bed mobility comments: Heavy mod assist to roll for lift pad placement    Transfers Overall transfer level: Needs assistance               General transfer comment: Used Maximove for transfer OOB to chair  Ambulation/Gait                 Stairs             Wheelchair Mobility    Modified Rankin (Stroke Patients Only)       Balance                                            Cognition  Arousal/Alertness: Awake/alert Behavior During Therapy: WFL for tasks assessed/performed Overall Cognitive Status: No family/caregiver present to determine baseline cognitive functioning Area of Impairment: Safety/judgement;Awareness                         Safety/Judgement: Decreased awareness of deficits Awareness: Emergent   General Comments: Pt not consistent with answers about previous level of functioning.  Feels if he can go home, he will be fine there despite being unable to stand here.      Exercises      General Comments General comments (skin integrity, edema, etc.): Pt most limited by pain in the RLE.  Pt keeps the knee and him fully flexed when laying in bed and requires a lot of time to stretch this leg out and attempt to stand.  Pt's RLE very painful with any movement or attempted weightbearing.      Pertinent Vitals/Pain Pain Assessment: Faces Faces Pain Scale: Hurts whole lot Pain Location: RLE esp in ankle and great toe Pain Descriptors / Indicators: Grimacing Pain Intervention(s): Monitored during session    Home Living  Prior Function            PT Goals (current goals can now be found in the care plan section) Acute Rehab PT Goals Patient Stated Goal: wants to be home to play with his granddaughter PT Goal Formulation: With patient Time For Goal Achievement: 12/27/20 Potential to Achieve Goals: Fair Progress towards PT goals: Progressing toward goals (slowly)    Frequency    Min 2X/week      PT Plan Current plan remains appropriate    Co-evaluation              AM-PAC PT "6 Clicks" Mobility   Outcome Measure  Help needed turning from your back to your side while in a flat bed without using bedrails?: A Lot Help needed moving from lying on your back to sitting on the side of a flat bed without using bedrails?: A Lot Help needed moving to and from a bed to a chair (including a wheelchair)?: A  Lot Help needed standing up from a chair using your arms (e.g., wheelchair or bedside chair)?: A Lot Help needed to walk in hospital room?: Total Help needed climbing 3-5 steps with a railing? : Total 6 Click Score: 10    End of Session Equipment Utilized During Treatment: Gait belt Activity Tolerance: Patient tolerated treatment well Patient left: in chair;with call bell/phone within reach;with chair alarm set;Other (comment) (Lift pad underneath him) Nurse Communication: Mobility status PT Visit Diagnosis: Other abnormalities of gait and mobility (R26.89);Repeated falls (R29.6);Muscle weakness (generalized) (M62.81);Hemiplegia and hemiparesis Hemiplegia - Right/Left: Right Hemiplegia - dominant/non-dominant: Dominant Hemiplegia - caused by: Cerebral infarction     Time: 1210-1246 PT Time Calculation (min) (ACUTE ONLY): 36 min  Charges:  $Therapeutic Activity: 23-37 mins                     Van Clines, PT  Acute Rehabilitation Services Pager [phone] Office [phone]    Levi Aland 12/15/2020, 4:25 PM

## 2020-12-16 LAB — CULTURE, BLOOD (ROUTINE X 2): Culture: NO GROWTH

## 2020-12-16 MED ORDER — COVID-19 MRNA VACC (MODERNA) 50 MCG/0.25ML IM SUSP
0.2500 mL | Freq: Once | INTRAMUSCULAR | Status: AC
  Administered 2020-12-16: 0.25 mL via INTRAMUSCULAR
  Filled 2020-12-16: qty 0.25

## 2020-12-16 MED ORDER — DOXYCYCLINE HYCLATE 100 MG PO TABS
100.0000 mg | ORAL_TABLET | Freq: Two times a day (BID) | ORAL | Status: AC
  Administered 2020-12-16 – 2020-12-17 (×3): 100 mg via ORAL
  Filled 2020-12-16 (×3): qty 1

## 2020-12-16 NOTE — Progress Notes (Signed)
Pt would like a COVID booster dose. Will give one Moderna booster dose.  Ulyses Southward, PharmD, BCIDP, AAHIVP, CPP Infectious Disease Pharmacist 12/16/2020 3:04 PM

## 2020-12-16 NOTE — Plan of Care (Signed)
°  Problem: Coping: °Goal: Level of anxiety will decrease °Outcome: Progressing °  °

## 2020-12-16 NOTE — Progress Notes (Signed)
PROGRESS NOTE  Steven Kent NLG:[id number] DOB: 31-Dec-1934 DOA: 12/10/2020 PCP: Ardith Dark, MD   LOS: 4 days   Brief Narrative / Interim history: 86 year old male with history of chronic leg edema, chronic right leg ulcer, hypertension, AAA, prior CVA with residual right-sided weakness was brought into the ED for right leg wound, nonhealing.  Patient tells me that he has been having more difficulties moving his right leg and has been so weak that he was unable to ambulate over the last few days.  I also discussed with the wife over the phone.  She tells me that over the last 3 to 4 days patient indeed has been nonambulatory because he is unable to move his right leg.  She also reports that he has been more confused and having intermittent hallucinations which is very unusual for him, normally he is alert and orient x4.  They initially called EMS to assist with recommendation from the wheelchair to bed, however on EMS evaluation he was disoriented to time, place, situation and brought to the hospital.  Subjective / 24h Interval events: No significant complaints.  No chest pain, no shortness of breath Assessment & Plan: Principal Problem Right leg wound, cellulitis -patient was not septic on admission but there was concern for an infectious process given confusion.  He was started on vancomycin and cefepime, blood cultures have remained negative.  He was eventually transitioned to doxycycline, continue for total of 7 days, today day #6.  Active Problems Acute metabolic encephalopathy /generalized weakness-patient with confusion at home, hallucinations, concern for infectious process versus other.  Seems to be clear this morning, wife agrees that he has improved with antibiotics to treat #1.  MRI of the brain was negative for acute strokes.  PT recommends SNF to which wife and patient agreeable.  Social worker consulted, SNF pending, wife has not chosen facility yet and then will need  insurance authorization  Hypokalemia-continue to monitor  Essential hypertension -continue torsemide  Chronic kidney disease stage III -Baseline around 1.2 - 1.5, creatinine at baseline today  Pulmonary vascular congestion on chest x-ray, chronic lower extremity swelling, chronic diastolic CHF-continue torsemide, on room air, clinically euvolemic  Mild rhabdomyolysis -stable, CK improving  Prior CVA with right-sided hemiparesis -patient at baseline can ambulate but has been unable to do so in the last 3 to 4 days, his right side appears to be much weaker.  MRI of the brain negative.  Short-term rehab SNF pending  Hyperlipidemia -resume home statin  Prior recurrent VTE -continue Xarelto  Scheduled Meds: . atorvastatin  40 mg Oral Daily  . brimonidine  1 drop Both Eyes Daily  . dorzolamide  1 drop Both Eyes BID  . doxycycline  100 mg Oral Q12H  . gabapentin  400 mg Oral BID  . latanoprost  1 drop Both Eyes QHS  . oxybutynin  5 mg Oral BID  . rivaroxaban  20 mg Oral Q supper  . torsemide  20 mg Oral Daily   Continuous Infusions: . sodium chloride Stopped (12/14/20 0400)   PRN Meds:.sodium chloride, acetaminophen **OR** acetaminophen, ondansetron **OR** ondansetron (ZOFRAN) IV, traMADol  Diet Orders (From admission, onward)    Start     Ordered   12/11/20 0209  Diet regular Room service appropriate? Yes; Fluid consistency: Thin  Diet effective now       Question Answer Comment  Room service appropriate? Yes   Fluid consistency: Thin      12/11/20 0208  DVT prophylaxis: rivaroxaban (XARELTO) tablet 20 mg Start: 12/11/20 1700 SCDs Start: 12/11/20 0246 rivaroxaban (XARELTO) tablet 20 mg     Code Status: Full Code  Family Communication: No family at bedside, discussed with wife yesterday  Status is: Inpatient  Altered mental status  Dispo: The patient is from: Home              Anticipated d/c is to: Home              Patient currently is not medically  stable to d/c.   Difficult to place patient No  Level of care: Telemetry Medical  Consultants:  None  Procedures:  none  Microbiology  Blood cultures 5/12-no growth  Antimicrobials: VAC cefepime 5/13 >> 5/15 doxycycline 5/15   Objective: Vitals:   12/15/20 1626 12/15/20 2324 12/16/20 0000 12/16/20 0539  BP: (!) 152/74 (!) 157/85  137/89  Pulse: 75 94  76  Resp: 18 16  15   Temp: 99 F (37.2 C) 98.9 F (37.2 C)  98.3 F (36.8 C)  TempSrc: Oral Oral  Oral  SpO2: 100% 99%    Weight:   80.8 kg   Height:        Intake/Output Summary (Last 24 hours) at 12/16/2020 1051 Last data filed at 12/16/2020 0500 Gross per 24 hour  Intake 360 ml  Output 1300 ml  Net -940 ml   Filed Weights   12/11/20 1119 12/16/20 0000  Weight: 84.5 kg 80.8 kg    Examination:  Constitutional: NAD Eyes: No icterus ENMT: mmm Neck: normal, supple Respiratory: Clear bilaterally, no wheezing Cardiovascular: Regular rate and rhythm, no murmurs, trace edema Abdomen: Soft, NT, ND, bowel sounds positive Musculoskeletal: no clubbing / cyanosis.  Skin: Chronic venous stasis changes bilaterally, wound with pink base, no drainage, surrounding cellulitis resolved Neurologic: Chronic right-sided weakness, no new focal deficits  Data Reviewed: I have independently reviewed following labs and imaging studies   CBC: Recent Labs  Lab 12/10/20 1727 12/11/20 0807 12/13/20 0431 12/14/20 0350 12/15/20 0424  WBC 7.2 7.2 6.7 6.2 7.4  NEUTROABS 5.2 5.3  --   --   --   HGB 13.8 12.6* 12.3* 12.6* 13.8  HCT 45.2 39.4 37.5* 38.6* 41.8  MCV 103.7* 98.5 95.9 97.0 96.8  PLT 168 163 155 160 195   Basic Metabolic Panel: Recent Labs  Lab 12/10/20 1727 12/11/20 0807 12/13/20 0431 12/14/20 0350 12/15/20 0424  NA 137 141 139 140 139  K 4.1 3.8 3.2* 3.4* 3.6  CL 104 107 105 105 103  CO2 23 26 28 28 27   GLUCOSE 110* 92 118* 92 95  BUN 22 20 18 15 18   CREATININE 1.41* 1.27* 1.22 1.20 1.19  CALCIUM 8.5*  8.1* 7.9* 8.1* 8.6*  MG  --   --  1.9  --   --   PHOS  --   --  3.0  --   --    Liver Function Tests: Recent Labs  Lab 12/10/20 1727 12/13/20 0431  AST 46* 38  ALT 17 20  ALKPHOS 58 44  BILITOT 1.2 0.8  PROT 7.2 6.2*  ALBUMIN 3.1* 2.3*   Coagulation Profile: No results for input(s): INR, PROTIME in the last 168 hours. HbA1C: No results for input(s): HGBA1C in the last 72 hours. CBG: Recent Labs  Lab 12/11/20 2138  GLUCAP 101*    Recent Results (from the past 240 hour(s))  Culture, blood (routine x 2)     Status: None   Collection Time:  12/10/20  5:32 PM   Specimen: BLOOD LEFT HAND  Result Value Ref Range Status   Specimen Description BLOOD LEFT HAND  Final   Special Requests   Final    BOTTLES DRAWN AEROBIC AND ANAEROBIC Blood Culture adequate volume   Culture   Final    NO GROWTH 5 DAYS Performed at [facility], [address] [place], [place] [address]    Report Status 12/15/2020 FINAL  Final  Culture, blood (routine x 2)     Status: None   Collection Time: 12/10/20  5:32 PM   Specimen: BLOOD  Result Value Ref Range Status   Specimen Description BLOOD SITE NOT SPECIFIED  Final   Special Requests   Final    BOTTLES DRAWN AEROBIC AND ANAEROBIC Blood Culture results may not be optimal due to an inadequate volume of blood received in culture bottles   Culture   Final    NO GROWTH 5 DAYS Performed at [facility], [address], [place], [place] [address]    Report Status 12/16/2020 FINAL  Final  SARS CORONAVIRUS 2 (TAT 6-24 HRS) Nasopharyngeal Nasopharyngeal Swab     Status: None   Collection Time: 12/10/20  6:52 PM   Specimen: Nasopharyngeal Swab  Result Value Ref Range Status   SARS Coronavirus 2 NEGATIVE NEGATIVE Final    Comment: (NOTE) SARS-CoV-2 target nucleic acids are NOT DETECTED.  The SARS-CoV-2 RNA is generally detectable in upper and lower respiratory specimens during the acute phase of infection. Negative results do not preclude  SARS-CoV-2 infection, do not rule out co-infections with other pathogens, and should not be used as the sole basis for treatment or other patient management decisions. Negative results must be combined with clinical observations, patient history, and epidemiological information. The expected result is Negative.  Fact Sheet for Patients: HairSlick.no  Fact Sheet for Healthcare Providers: quierodirigir.com  This test is not yet approved or cleared by the [place] FDA and  has been authorized for detection and/or diagnosis of SARS-CoV-2 by FDA under an Emergency Use Authorization (EUA). This EUA will remain  in effect (meaning this test can be used) for the duration of the COVID-19 declaration under Se ction 564(b)(1) of the Act, 21 U.S.C. section 360bbb-3(b)(1), unless the authorization is terminated or revoked sooner.  Performed at [facility], [address], [place], [place] [address]   Urine culture     Status: Abnormal   Collection Time: 12/10/20  7:55 PM   Specimen: Urine, Random  Result Value Ref Range Status   Specimen Description URINE, RANDOM  Final   Special Requests NONE  Final   Culture (A)  Final    <10,000 COLONIES/mL INSIGNIFICANT GROWTH Performed at [facility], [address], [place], [place] [address]    Report Status 12/12/2020 FINAL  Final     Radiology Studies: No results found.  Pamella Pert, MD, PhD [facility]  Between 7 am - 7 pm I am available, please contact me via Amion (for emergencies) or Securechat (non urgent messages)  Between 7 pm - 7 am I am not available, please contact night coverage MD/APP via Amion

## 2020-12-16 NOTE — Progress Notes (Signed)
Pt was started on vanc/cefepime before transitioning to doxy. Today is D6 of total abx. Ok to add stop date of 7d per Dr. Elvera Lennox.  Ulyses Southward, PharmD, BCIDP, AAHIVP, CPP Infectious Disease Pharmacist 12/16/2020 9:12 AM

## 2020-12-16 NOTE — TOC Progression Note (Signed)
Transition of Care (TOC) - Progression Note  *Facility choice is [facility]   Patient Details  Name: MARGUIS MATHIESON MRN: [id number] Date of Birth: 30-Mar-1935  Transition of Care [facility]) CM/SW Contact  Okey Dupre Lazaro Arms, LCSW Phone Number: 12/16/2020, 3:02 PM  Clinical Narrative:  Mrs. Bhavsar's rehab facility choice is [facility]. Call made to admissions director Malena Peer [phone]) regarding patient and they can accept him for ST rehab pending insurance authorization. Call made to [facility] to determine if they manage patient and they do not. Contacted Malena Peer and informed him and he will initiate insurance authorization with [employer]. CSW informed by Malena Peer that patient will be quarantined for 10 days since he has not had a COVID booster.Contacted wife and she is agreeable to her husband getting the booster if [facility] can administer it. Contacted MD regarding patient getting the COVID booster and he will check into it.      Expected Discharge Plan: Skilled Nursing Facility Barriers to Discharge: Other (comment) (Sending out patient info to SNF's, a SNF being chosen, [employer])  Expected Discharge Plan and Services Expected Discharge Plan: Skilled Nursing Facility In-house Referral: Clinical Social Work     Living arrangements for the past 2 months: Apartment                                     Social Determinants of Health (SDOH) Interventions  None needed or requested at this time  Readmission Risk Interventions No flowsheet data found.

## 2020-12-16 NOTE — Plan of Care (Signed)

## 2020-12-17 LAB — BASIC METABOLIC PANEL
Anion gap: 9 (ref 5–15)
BUN: 23 mg/dL (ref 8–23)
CO2: 32 mmol/L (ref 22–32)
Calcium: 8.8 mg/dL — ABNORMAL LOW (ref 8.9–10.3)
Chloride: 101 mmol/L (ref 98–111)
Creatinine, Ser: 1.27 mg/dL — ABNORMAL HIGH (ref 0.61–1.24)
GFR, Estimated: 56 mL/min — ABNORMAL LOW (ref >60)
Glucose, Bld: 94 mg/dL (ref 70–99)
Potassium: 4.1 mmol/L (ref 3.5–5.1)
Sodium: 142 mmol/L (ref 135–145)

## 2020-12-17 NOTE — Plan of Care (Signed)
  Problem: Clinical Measurements: Goal: Ability to maintain clinical measurements within normal limits will improve Outcome: Progressing Goal: Will remain free from infection Outcome: Progressing Goal: Cardiovascular complication will be avoided Outcome: Progressing   Problem: Pain Managment: Goal: General experience of comfort will improve Outcome: Progressing   Problem: Safety: Goal: Ability to remain free from injury will improve Outcome: Progressing   Problem: Skin Integrity: Goal: Risk for impaired skin integrity will decrease Outcome: Progressing   Problem: Nutrition: Goal: Adequate nutrition will be maintained Outcome: Progressing

## 2020-12-17 NOTE — Progress Notes (Signed)
PROGRESS NOTE    DAMIN SALIDO  GNF:[id number] DOB: 1935/05/30 DOA: 12/10/2020 PCP: Ardith Dark, MD    Brief Narrative: This 86 year old male with history of chronic leg edema, chronic right leg ulcer, hypertension, AAA, prior CVA with residual right-sided weakness was brought into the ED for right leg wound, nonhealing.  Patient reports that he has been having more difficulties moving his right leg and has been so weak that he was unable to ambulate over the last few days.  History was also obtained from his wife.  She reports that over the last 3 to 4 days patient indeed has been nonambulatory because he is unable to move his right leg.  She also reports that he has been more confused and having intermittent hallucinations which is very unusual for him, normally he is alert and orient x4.  They initially called EMS to assist with recommendation from the wheelchair to bed, however on EMS evaluation he was disoriented to time, place, situation and brought to the hospital  Assessment & Plan:   Active Problems:   Hemiparesis affecting right side as late effect of cerebrovascular accident [facility])   Essential hypertension   Leg edema   Hyperlipidemia   Cellulitis of leg, right   Pulmonary vascular congestion   Rhabdomyolysis   Lactic acid acidosis   Pressure injury of skin   Principal Problem Right leg wound, cellulitis: Patient was not septic on admission but there was concern for an infectious process given confusion.   He was started on vancomycin and cefepime, blood cultures have remained negative.  He was eventually transitioned to doxycycline, continue for total of 7 days, today day #6.  Active Problems Acute metabolic encephalopathy /generalized weakness> Improving. Patient presented with confusion at home, hallucinations, concern for infectious process versus other.   Mental status back to normal.  Wife agrees that he has improved with antibiotics to treat cellulitis.   MRI of the brain was negative for acute strokes.  PT recommends SNF to which wife and patient agreeable. Social worker consulted, SNF pending, wife has not chosen facility yet and then will need insurance authorization.  Hypokalemia > resolved.  Continue to monitor.  Essential hypertension : Continue torsemide.  Chronic kidney disease stage III :> Baseline around 1.2 - 1.5, creatinine at baseline.  Chronic diastolic CHF:   Chest x-ray shows pulmonary vascular congestion.  Continue torsemide, on room air, clinically euvolemic.  Mild rhabdomyolysis -stable, CK improving.  Prior CVA with right-sided hemiparesis : patient at baseline can ambulate but has been unable to do so in the last 3 to 4 days, his right side appears to be much weaker.  MRI of the brain negative.  Short-term rehab SNF pending  Hyperlipidemia : Continue rosuvastatin.  Prior recurrent VTE : Continue Xarelto   DVT prophylaxis: Xarelto Code Status:Full code. Family Communication:  No family at bed side. Disposition Plan:  Status is: Inpatient  Remains inpatient appropriate because:Inpatient level of care appropriate due to severity of illness   Dispo: The patient is from: Home              Anticipated d/c is to: Home              Patient currently is not medically stable to d/c.   Difficult to place patient No   Consultants:   None  Procedures:  Antimicrobials:   Anti-infectives (From admission, onward)   Start     Dose/Rate Route Frequency Ordered Stop   12/16/20 1000  doxycycline (VIBRA-TABS) tablet 100 mg        100 mg Oral Every 12 hours 12/16/20 0911 12/17/20 0905   12/13/20 1100  doxycycline (VIBRAMYCIN) 100 mg in sodium chloride 0.9 % 250 mL IVPB  Status:  Discontinued        100 mg 125 mL/hr over 120 Minutes Intravenous Every 12 hours 12/13/20 0957 12/16/20 0911   12/11/20 1700  vancomycin (VANCOREADY) IVPB 1250 mg/250 mL  Status:  Discontinued        1,250 mg 166.7 mL/hr over 90  Minutes Intravenous Every 24 hours 12/10/20 1912 12/13/20 0957   12/11/20 0300  ceFEPIme (MAXIPIME) 2 g in sodium chloride 0.9 % 100 mL IVPB  Status:  Discontinued        2 g 200 mL/hr over 30 Minutes Intravenous Every 12 hours 12/11/20 0211 12/13/20 0957   12/10/20 1915  vancomycin (VANCOREADY) IVPB 1000 mg/200 mL  Status:  Discontinued        1,000 mg 200 mL/hr over 60 Minutes Intravenous  Once 12/10/20 1900 12/10/20 1912   12/10/20 1915  cefTRIAXone (ROCEPHIN) 2 g in sodium chloride 0.9 % 100 mL IVPB        2 g 200 mL/hr over 30 Minutes Intravenous  Once 12/10/20 1900 12/10/20 2050   12/10/20 1915  vancomycin (VANCOREADY) IVPB 1750 mg/350 mL        1,750 mg 175 mL/hr over 120 Minutes Intravenous  Once 12/10/20 1912 12/10/20 2225      Subjective: Patient was seen and examined at bedside.  Overnight events noted.  He reports pain is better controlled.   Right leg is swollen but covered in dressing.  He denies any other concerns.  Objective: Vitals:   12/16/20 1724 12/16/20 2200 12/17/20 0331 12/17/20 0902  BP: 107/71 120/60 (!) 111/96 110/64  Pulse: 78 65 62 69  Resp: 16 19 19 18   Temp: 98 F (36.7 C) 98.5 F (36.9 C) 98 F (36.7 C) 98.7 F (37.1 C)  TempSrc: Oral Oral Oral Oral  SpO2: 100%  100% 99%  Weight:      Height:        Intake/Output Summary (Last 24 hours) at 12/17/2020 1539 Last data filed at 12/17/2020 1345 Gross per 24 hour  Intake 480 ml  Output 900 ml  Net -420 ml   Filed Weights   12/11/20 1119 12/16/20 0000  Weight: 84.5 kg 80.8 kg    Examination:  General exam: Appears calm and comfortable, not in any acute distress Respiratory system: Clear to auscultation. Respiratory effort normal. Cardiovascular system: S1 & S2 heard, RRR. No JVD, murmurs, rubs, gallops or clicks. No pedal edema. Gastrointestinal system: Abdomen is nondistended, soft and nontender. No organomegaly or masses felt. Normal bowel sounds heard. Central nervous system: Alert and  oriented. No focal neurological deficits. Extremities: Symmetric 5 x 5 power. Skin: Chronic venous stasis changes bilaterally, wound with pink base no drainage.  Surrounding erythema noted. Psychiatry: Judgement and insight appear normal. Mood & affect appropriate.     Data Reviewed: I have personally reviewed following labs and imaging studies  CBC: Recent Labs  Lab 12/10/20 1727 12/11/20 0807 12/13/20 0431 12/14/20 0350 12/15/20 0424  WBC 7.2 7.2 6.7 6.2 7.4  NEUTROABS 5.2 5.3  --   --   --   HGB 13.8 12.6* 12.3* 12.6* 13.8  HCT 45.2 39.4 37.5* 38.6* 41.8  MCV 103.7* 98.5 95.9 97.0 96.8  PLT 168 163 155 160 195   Basic Metabolic Panel:  Recent Labs  Lab 12/11/20 0807 12/13/20 0431 12/14/20 0350 12/15/20 0424 12/17/20 0306  NA 141 139 140 139 142  K 3.8 3.2* 3.4* 3.6 4.1  CL 107 105 105 103 101  CO2 26 28 28 27  32  GLUCOSE 92 118* 92 95 94  BUN 20 18 15 18 23   CREATININE 1.27* 1.22 1.20 1.19 1.27*  CALCIUM 8.1* 7.9* 8.1* 8.6* 8.8*  MG  --  1.9  --   --   --   PHOS  --  3.0  --   --   --    GFR: Estimated Creatinine Clearance: 50.4 mL/min (A) (by C-G formula based on SCr of 1.27 mg/dL (H)). Liver Function Tests: Recent Labs  Lab 12/10/20 1727 12/13/20 0431  AST 46* 38  ALT 17 20  ALKPHOS 58 44  BILITOT 1.2 0.8  PROT 7.2 6.2*  ALBUMIN 3.1* 2.3*   Recent Labs  Lab 12/10/20 1727  LIPASE 29   No results for input(s): AMMONIA in the last 168 hours. Coagulation Profile: No results for input(s): INR, PROTIME in the last 168 hours. Cardiac Enzymes: Recent Labs  Lab 12/10/20 1727 12/13/20 0431  CKTOTAL 1,104* 678*   BNP (last 3 results) No results for input(s): PROBNP in the last 8760 hours. HbA1C: No results for input(s): HGBA1C in the last 72 hours. CBG: Recent Labs  Lab 12/11/20 2138  GLUCAP 101*   Lipid Profile: No results for input(s): CHOL, HDL, LDLCALC, TRIG, CHOLHDL, LDLDIRECT in the last 72 hours. Thyroid Function Tests: No results  for input(s): TSH, T4TOTAL, FREET4, T3FREE, THYROIDAB in the last 72 hours. Anemia Panel: No results for input(s): VITAMINB12, FOLATE, FERRITIN, TIBC, IRON, RETICCTPCT in the last 72 hours. Sepsis Labs: Recent Labs  Lab 12/10/20 1727 12/10/20 2230  LATICACIDVEN 2.5* 1.6    Recent Results (from the past 240 hour(s))  Culture, blood (routine x 2)     Status: None   Collection Time: 12/10/20  5:32 PM   Specimen: BLOOD LEFT HAND  Result Value Ref Range Status   Specimen Description BLOOD LEFT HAND  Final   Special Requests   Final    BOTTLES DRAWN AEROBIC AND ANAEROBIC Blood Culture adequate volume   Culture   Final    NO GROWTH 5 DAYS Performed at [facility], [address] [place], [address] [place]    Report Status 12/15/2020 FINAL  Final  Culture, blood (routine x 2)     Status: None   Collection Time: 12/10/20  5:32 PM   Specimen: BLOOD  Result Value Ref Range Status   Specimen Description BLOOD SITE NOT SPECIFIED  Final   Special Requests   Final    BOTTLES DRAWN AEROBIC AND ANAEROBIC Blood Culture results may not be optimal due to an inadequate volume of blood received in culture bottles   Culture   Final    NO GROWTH 5 DAYS Performed at [facility], [address], [place], [address] [place]    Report Status 12/16/2020 FINAL  Final  SARS CORONAVIRUS 2 (TAT 6-24 HRS) Nasopharyngeal Nasopharyngeal Swab     Status: None   Collection Time: 12/10/20  6:52 PM   Specimen: Nasopharyngeal Swab  Result Value Ref Range Status   SARS Coronavirus 2 NEGATIVE NEGATIVE Final    Comment: (NOTE) SARS-CoV-2 target nucleic acids are NOT DETECTED.  The SARS-CoV-2 RNA is generally detectable in upper and lower respiratory specimens during the acute phase of infection. Negative results do not  preclude SARS-CoV-2 infection, do not rule out co-infections with other pathogens, and should not be used as the sole basis for treatment or other patient management  decisions. Negative results must be combined with clinical observations, patient history, and epidemiological information. The expected result is Negative.  Fact Sheet for Patients: HairSlick.no  Fact Sheet for Healthcare Providers: quierodirigir.com  This test is not yet approved or cleared by the [place] FDA and  has been authorized for detection and/or diagnosis of SARS-CoV-2 by FDA under an Emergency Use Authorization (EUA). This EUA will remain  in effect (meaning this test can be used) for the duration of the COVID-19 declaration under Se ction 564(b)(1) of the Act, 21 U.S.C. section 360bbb-3(b)(1), unless the authorization is terminated or revoked sooner.  Performed at [facility], [address] [place], [place] [address]   Urine culture     Status: Abnormal   Collection Time: 12/10/20  7:55 PM   Specimen: Urine, Random  Result Value Ref Range Status   Specimen Description URINE, RANDOM  Final   Special Requests NONE  Final   Culture (A)  Final    <10,000 COLONIES/mL INSIGNIFICANT GROWTH Performed at [facility], [address] [place], [place] [address]    Report Status 12/12/2020 FINAL  Final    Radiology Studies: No results found.  Scheduled Meds: . atorvastatin  40 mg Oral Daily  . brimonidine  1 drop Both Eyes Daily  . dorzolamide  1 drop Both Eyes BID  . gabapentin  400 mg Oral BID  . latanoprost  1 drop Both Eyes QHS  . oxybutynin  5 mg Oral BID  . rivaroxaban  20 mg Oral Q supper  . torsemide  20 mg Oral Daily   Continuous Infusions: . sodium chloride Stopped (12/14/20 0400)     LOS: 5 days    Time spent: 35 mins    Neta Upadhyay, MD [facility]   If 7PM-7AM, please contact night-coverage

## 2020-12-18 LAB — BASIC METABOLIC PANEL
Anion gap: 8 (ref 5–15)
BUN: 22 mg/dL (ref 8–23)
CO2: 28 mmol/L (ref 22–32)
Calcium: 8.3 mg/dL — ABNORMAL LOW (ref 8.9–10.3)
Chloride: 100 mmol/L (ref 98–111)
Creatinine, Ser: 1.08 mg/dL (ref 0.61–1.24)
GFR, Estimated: >60 mL/min (ref >60)
Glucose, Bld: 98 mg/dL (ref 70–99)
Potassium: 3.8 mmol/L (ref 3.5–5.1)
Sodium: 136 mmol/L (ref 135–145)

## 2020-12-18 LAB — RESP PANEL BY RT-PCR (FLU A&B, COVID) ARPGX2
Influenza A by PCR: NEGATIVE
Influenza B by PCR: NEGATIVE
SARS Coronavirus 2 by RT PCR: NEGATIVE

## 2020-12-18 LAB — PHOSPHORUS: Phosphorus: 2.9 mg/dL (ref 2.5–4.6)

## 2020-12-18 LAB — MAGNESIUM: Magnesium: 2.1 mg/dL (ref 1.7–2.4)

## 2020-12-18 NOTE — TOC Transition Note (Signed)
Transition of Care [facility]) - CM/SW Discharge Note *Discharge to [facility] SNF *Number for Report: [phone] *Room 210A   Patient Details  Name: Steven Kent MRN: [id number] Date of Birth: 29-Mar-1935  Transition of Care [facility]) CM/SW Contact:  Cristobal Goldmann, LCSW Phone Number: 12/18/2020, 5:36 PM   Clinical Narrative: Patient medically stable for discharge and going to [place] skilled nursing facility today. Wife informed regarding discharge and that husband received the Moderna booster on 5/18. Mrs. Lisbon informed that the card was placed in her husband's personal belongings bag.    Talked with admissions director Malena Peer regarding discharge and d/c clinicals transmitted to facility.   Final next level of care: Skilled Nursing Facility [facility]) Barriers to Discharge: Barriers Resolved   Patient Goals and CMS Choice Patient states their goals for this hospitalization and ongoing recovery are:: Wife agreeable to ST rehab for patient CMS Medicare.gov Compare Post Acute Care list provided to:: Patient Represenative (must comment) (Wife informed regardng [email or web address] Choice offered to / list presented to : Spouse  Discharge Placement PASRR number recieved: 12/15/20            Patient chooses bed at: [facility] Patient to be transferred to facility by: Non-emergency ambulance transport Name of family member notified: Wife, [name] [phone]) Patient and family notified of of transfer: 12/18/20  Discharge Plan and Services In-house Referral: Clinical Social Work                                  Social Determinants of Health (SDOH) Interventions  No SDOH interventions requested or needed at discharge.   Readmission Risk Interventions No flowsheet data found.

## 2020-12-18 NOTE — Progress Notes (Signed)
PROGRESS NOTE    Steven Kent  YQI:[id number] DOB: [birth year] DOA: 12/10/2020 PCP: Ardith Dark, MD    Brief Narrative: This 86 year old male with history of chronic leg edema, chronic right leg ulcer, hypertension, AAA, prior CVA with residual right-sided weakness was brought into the ED for right leg wound, nonhealing.  Steven Kent reports that he has been having more difficulties moving his right leg and has been so weak that he was unable to ambulate over the last few days.  History was also obtained from his wife.  She reports that over the last 3 to 4 days Steven Kent indeed has been nonambulatory because he is unable to move his right leg.  She also reports that he has been more confused and having intermittent hallucinations which is very unusual for him, normally he is alert and orient x4.  They initially called EMS to assist with recommendation from the wheelchair to bed, however on EMS evaluation he was disoriented to time, place, situation and brought to the hospital  Assessment & Plan:   Active Problems:   Hemiparesis affecting right side as late effect of cerebrovascular accident [facility])   Essential hypertension   Leg edema   Hyperlipidemia   Cellulitis of leg, right   Pulmonary vascular congestion   Rhabdomyolysis   Lactic acid acidosis   Pressure injury of skin   Principal Problem Right leg wound, cellulitis: Steven Kent was not septic on admission but there was concern for an infectious process given confusion.   He was started on vancomycin and cefepime, blood cultures have remained negative.  He was eventually transitioned to doxycycline, continue for total of 7 days, today day #6. He has completed antibiotics for 7 days.  Active Problems Acute metabolic encephalopathy /generalized weakness> Improved.. Steven Kent presented with confusion at home, hallucinations, concern for infectious process versus other.   Mental status back to normal.  Wife agrees that he has  improved with antibiotics to treat cellulitis.  MRI of the brain was negative for acute strokes.  PT recommends SNF to which wife and Steven Kent agreeable.  Social worker consulted, SNF pending, wife has not chosen facility yet and then will need insurance authorization.  Hypokalemia > resolved.  Continue to monitor.  Essential hypertension : Continue torsemide.  Chronic kidney disease stage III :> Baseline around 1.2 - 1.5, creatinine at baseline.  Chronic diastolic CHF:   Chest x-ray shows pulmonary vascular congestion.  Continue torsemide, on room air, clinically euvolemic.  Mild rhabdomyolysis -stable, CK improving.  Prior CVA with right-sided hemiparesis : Steven Kent at baseline can ambulate but has been unable to do so in the last 3 to 4 days, his right side appears to be much weaker.  MRI of the brain negative.  Short-term rehab SNF pending  Hyperlipidemia : Continue rosuvastatin.  Prior recurrent VTE : Continue Xarelto   DVT prophylaxis: Xarelto Code Status:Full code. Family Communication:  No family at bed side. Disposition Plan:  Status is: Inpatient  Remains inpatient appropriate because:Inpatient level of care appropriate due to severity of illness   Dispo: The Steven Kent is from: Home              Anticipated d/c is to: SNF (insurance authorization pending)              Steven Kent currently is not medically stable to d/c.   Difficult to place Steven Kent No   Consultants:   None  Procedures:  Antimicrobials:   Anti-infectives (From admission, onward)   Start  Dose/Rate Route Frequency Ordered Stop   12/16/20 1000  doxycycline (VIBRA-TABS) tablet 100 mg        100 mg Oral Every 12 hours 12/16/20 0911 12/17/20 0905   12/13/20 1100  doxycycline (VIBRAMYCIN) 100 mg in sodium chloride 0.9 % 250 mL IVPB  Status:  Discontinued        100 mg 125 mL/hr over 120 Minutes Intravenous Every 12 hours 12/13/20 0957 12/16/20 0911   12/11/20 1700  vancomycin (VANCOREADY)  IVPB 1250 mg/250 mL  Status:  Discontinued        1,250 mg 166.7 mL/hr over 90 Minutes Intravenous Every 24 hours 12/10/20 1912 12/13/20 0957   12/11/20 0300  ceFEPIme (MAXIPIME) 2 g in sodium chloride 0.9 % 100 mL IVPB  Status:  Discontinued        2 g 200 mL/hr over 30 Minutes Intravenous Every 12 hours 12/11/20 0211 12/13/20 0957   12/10/20 1915  vancomycin (VANCOREADY) IVPB 1000 mg/200 mL  Status:  Discontinued        1,000 mg 200 mL/hr over 60 Minutes Intravenous  Once 12/10/20 1900 12/10/20 1912   12/10/20 1915  cefTRIAXone (ROCEPHIN) 2 g in sodium chloride 0.9 % 100 mL IVPB        2 g 200 mL/hr over 30 Minutes Intravenous  Once 12/10/20 1900 12/10/20 2050   12/10/20 1915  vancomycin (VANCOREADY) IVPB 1750 mg/350 mL        1,750 mg 175 mL/hr over 120 Minutes Intravenous  Once 12/10/20 1912 12/10/20 2225      Subjective: Steven Kent was seen and examined at bedside.  Overnight events noted.  He reports pain is better controlled.   Right leg is swollen but covered in dressing.  He denies any other concerns.  Objective: Vitals:   12/17/20 1606 12/17/20 2109 12/18/20 0523 12/18/20 0842  BP: 128/61 122/69 128/72 (!) 145/83  Pulse: 72 74 (!) 56 71  Resp: 17 18 15 18   Temp: 97.7 F (36.5 C) 99.1 F (37.3 C) 98 F (36.7 C) 97.9 F (36.6 C)  TempSrc: Oral Oral Oral Oral  SpO2: 99% 100% 99% 98%  Weight:      Height:        Intake/Output Summary (Last 24 hours) at 12/18/2020 1528 Last data filed at 12/18/2020 1000 Gross per 24 hour  Intake 220 ml  Output 2050 ml  Net -1830 ml   Filed Weights   12/11/20 1119 12/16/20 0000  Weight: 84.5 kg 80.8 kg    Examination:  General exam: Appears calm and comfortable, not in any acute distress Respiratory system: Clear to auscultation. Respiratory effort normal. Cardiovascular system: S1 & S2 heard, RRR. No JVD, murmurs, rubs, gallops or clicks. No pedal edema. Gastrointestinal system: Abdomen is nondistended, soft and nontender. No  organomegaly or masses felt. Normal bowel sounds heard. Central nervous system: Alert and oriented. No focal neurological deficits. Extremities: Symmetric 5 x 5 power. Skin: Chronic venous stasis changes bilaterally, wound with pink base no drainage.  Surrounding erythema noted. Psychiatry: Judgement and insight appear normal. Mood & affect appropriate.     Data Reviewed: I have personally reviewed following labs and imaging studies  CBC: Recent Labs  Lab 12/13/20 0431 12/14/20 0350 12/15/20 0424  WBC 6.7 6.2 7.4  HGB 12.3* 12.6* 13.8  HCT 37.5* 38.6* 41.8  MCV 95.9 97.0 96.8  PLT 155 160 195   Basic Metabolic Panel: Recent Labs  Lab 12/13/20 0431 12/14/20 0350 12/15/20 0424 12/17/20 0306 12/18/20 0534  NA  139 140 139 142 136  K 3.2* 3.4* 3.6 4.1 3.8  CL 105 105 103 101 100  CO2 28 28 27  32 28  GLUCOSE 118* 92 95 94 98  BUN 18 15 18 23 22   CREATININE 1.22 1.20 1.19 1.27* 1.08  CALCIUM 7.9* 8.1* 8.6* 8.8* 8.3*  MG 1.9  --   --   --  2.1  PHOS 3.0  --   --   --  2.9   GFR: Estimated Creatinine Clearance: 59.2 mL/min (by C-G formula based on SCr of 1.08 mg/dL). Liver Function Tests: Recent Labs  Lab 12/13/20 0431  AST 38  ALT 20  ALKPHOS 44  BILITOT 0.8  PROT 6.2*  ALBUMIN 2.3*   No results for input(s): LIPASE, AMYLASE in the last 168 hours. No results for input(s): AMMONIA in the last 168 hours. Coagulation Profile: No results for input(s): INR, PROTIME in the last 168 hours. Cardiac Enzymes: Recent Labs  Lab 12/13/20 0431  CKTOTAL 678*   BNP (last 3 results) No results for input(s): PROBNP in the last 8760 hours. HbA1C: No results for input(s): HGBA1C in the last 72 hours. CBG: Recent Labs  Lab 12/11/20 2138  GLUCAP 101*   Lipid Profile: No results for input(s): CHOL, HDL, LDLCALC, TRIG, CHOLHDL, LDLDIRECT in the last 72 hours. Thyroid Function Tests: No results for input(s): TSH, T4TOTAL, FREET4, T3FREE, THYROIDAB in the last 72  hours. Anemia Panel: No results for input(s): VITAMINB12, FOLATE, FERRITIN, TIBC, IRON, RETICCTPCT in the last 72 hours. Sepsis Labs: No results for input(s): PROCALCITON, LATICACIDVEN in the last 168 hours.  Recent Results (from the past 240 hour(s))  Culture, blood (routine x 2)     Status: None   Collection Time: 12/10/20  5:32 PM   Specimen: BLOOD LEFT HAND  Result Value Ref Range Status   Specimen Description BLOOD LEFT HAND  Final   Special Requests   Final    BOTTLES DRAWN AEROBIC AND ANAEROBIC Blood Culture adequate volume   Culture   Final    NO GROWTH 5 DAYS Performed at [facility], [address] [place], [address] [place]    Report Status 12/15/2020 FINAL  Final  Culture, blood (routine x 2)     Status: None   Collection Time: 12/10/20  5:32 PM   Specimen: BLOOD  Result Value Ref Range Status   Specimen Description BLOOD SITE NOT SPECIFIED  Final   Special Requests   Final    BOTTLES DRAWN AEROBIC AND ANAEROBIC Blood Culture results may not be optimal due to an inadequate volume of blood received in culture bottles   Culture   Final    NO GROWTH 5 DAYS Performed at [facility], [address], [place], [address] [place]    Report Status 12/16/2020 FINAL  Final  SARS CORONAVIRUS 2 (TAT 6-24 HRS) Nasopharyngeal Nasopharyngeal Swab     Status: None   Collection Time: 12/10/20  6:52 PM   Specimen: Nasopharyngeal Swab  Result Value Ref Range Status   SARS Coronavirus 2 NEGATIVE NEGATIVE Final    Comment: (NOTE) SARS-CoV-2 target nucleic acids are NOT DETECTED.  The SARS-CoV-2 RNA is generally detectable in upper and lower respiratory specimens during the acute phase of infection. Negative results do not preclude SARS-CoV-2 infection, do not rule out co-infections with other pathogens, and should not be used as the sole basis for treatment or other Steven Kent management decisions. Negative results must be combined with clinical observations, Steven Kent  history, and epidemiological information. The expected result is Negative.  Fact Sheet for Patients: HairSlick.nohttps://www.fda.gov/media/138098/download  Fact Sheet for Healthcare Providers: quierodirigir.comhttps://www.fda.gov/media/138095/download  This test is not yet approved or cleared by the [place] FDA and  has been authorized for detection and/or diagnosis of SARS-CoV-2 by FDA under an Emergency Use Authorization (EUA). This EUA will remain  in effect (meaning this test can be used) for the duration of the COVID-19 declaration under Se ction 564(b)(1) of the Act, 21 U.S.C. section 360bbb-3(b)(1), unless the authorization is terminated or revoked sooner.  Performed at [facility], [address] [place], [place] [address]   Urine culture     Status: Abnormal   Collection Time: 12/10/20  7:55 PM   Specimen: Urine, Random  Result Value Ref Range Status   Specimen Description URINE, RANDOM  Final   Special Requests NONE  Final   Culture (A)  Final    <10,000 COLONIES/mL INSIGNIFICANT GROWTH Performed at [facility], [address] [place], [place] [address]    Report Status 12/12/2020 FINAL  Final  Resp Panel by RT-PCR (Flu A&B, Covid) Nasopharyngeal Swab     Status: None   Collection Time: 12/18/20 10:42 AM   Specimen: Nasopharyngeal Swab; Nasopharyngeal(NP) swabs in vial transport medium  Result Value Ref Range Status   SARS Coronavirus 2 by RT PCR NEGATIVE NEGATIVE Final    Comment: (NOTE) SARS-CoV-2 target nucleic acids are NOT DETECTED.  The SARS-CoV-2 RNA is generally detectable in upper respiratory specimens during the acute phase of infection. The lowest concentration of SARS-CoV-2 viral copies this assay can detect is 138 copies/mL. A negative result does not preclude SARS-Cov-2 infection and should not be used as the sole basis for treatment or other Steven Kent management decisions. A negative result may occur with  improper specimen collection/handling, submission  of specimen other than nasopharyngeal swab, presence of viral mutation(s) within the areas targeted by this assay, and inadequate number of viral copies(<138 copies/mL). A negative result must be combined with clinical observations, Steven Kent history, and epidemiological information. The expected result is Negative.  Fact Sheet for Patients:  BloggerCourse.comhttps://www.fda.gov/media/152166/download  Fact Sheet for Healthcare Providers:  SeriousBroker.ithttps://www.fda.gov/media/152162/download  This test is no t yet approved or cleared by the [place] FDA and  has been authorized for detection and/or diagnosis of SARS-CoV-2 by FDA under an Emergency Use Authorization (EUA). This EUA will remain  in effect (meaning this test can be used) for the duration of the COVID-19 declaration under Section 564(b)(1) of the Act, 21 U.S.C.section 360bbb-3(b)(1), unless the authorization is terminated  or revoked sooner.       Influenza A by PCR NEGATIVE NEGATIVE Final   Influenza B by PCR NEGATIVE NEGATIVE Final    Comment: (NOTE) The Xpert Xpress SARS-CoV-2/FLU/RSV plus assay is intended as an aid in the diagnosis of influenza from Nasopharyngeal swab specimens and should not be used as a sole basis for treatment. Nasal washings and aspirates are unacceptable for Xpert Xpress SARS-CoV-2/FLU/RSV testing.  Fact Sheet for Patients: BloggerCourse.comhttps://www.fda.gov/media/152166/download  Fact Sheet for Healthcare Providers: SeriousBroker.ithttps://www.fda.gov/media/152162/download  This test is not yet approved or cleared by the [place] FDA and has been authorized for detection and/or diagnosis of SARS-CoV-2 by FDA under an Emergency Use Authorization (EUA). This EUA will remain in effect (meaning this test can be used) for the duration of the COVID-19 declaration under Section 564(b)(1) of the Act, 21 U.S.C. section 360bbb-3(b)(1), unless the authorization is terminated or revoked.  Performed at  [facility], [address] [place]  [place], [place] [address]     Radiology Studies: No results found.  Scheduled Meds: . atorvastatin  40 mg Oral Daily  . brimonidine  1 drop Both Eyes Daily  . dorzolamide  1 drop Both Eyes BID  . gabapentin  400 mg Oral BID  . latanoprost  1 drop Both Eyes QHS  . oxybutynin  5 mg Oral BID  . rivaroxaban  20 mg Oral Q supper  . torsemide  20 mg Oral Daily   Continuous Infusions: . sodium chloride Stopped (12/14/20 0400)     LOS: 6 days    Time spent: 25 mins    Zared Knoth, MD [facility]   If 7PM-7AM, please contact night-coverage

## 2020-12-18 NOTE — Care Management Important Message (Signed)
Important Message  Patient Details  Name: Steven Kent MRN: [id number] Date of Birth: 07/03/1935   Medicare Important Message Given:  Yes - Important Message mailed due to current National Emergency   Verbal consent obtained due to current National Emergency  Relationship to patient: Self Contact Name: Wenceslaus Call Date: 12/18/20  Time: 1010 Phone: [phone] Outcome: No Answer/Busy Important Message mailed to: Patient address on file    [name] 12/18/2020, 10:10 AM

## 2020-12-18 NOTE — Progress Notes (Signed)
Physical Therapy Treatment Patient Details Name: Steven Kent MRN: [id number] DOB: 1935-04-06 Today's Date: 12/18/2020    History of Present Illness 86 year old male with history of chronic leg edema, chronic right leg ulcer, hypertension, AAA, prior CVA with residual right-sided weakness was brought into the ED for right leg wound, nonhealing, and has been having more difficulties moving his right leg and has been so weak that he was unable to ambulate over the last few days leading up to admission; Wife reports incr confusion and hallucinations, which are NOT his baseline.    PT Comments    Pt supine in bed with full flexion of R hip and knee. Pt requires modA for rolling for lift pad placement. Pt requires increased time and effort for knee extension for safe use of lift equipment.  Once lifted to chair worked on R sides stretching/ROM exercises. D/c plans remain appropriate at this time. PT will continue to follow acutely.   Follow Up Recommendations  SNF     Equipment Recommendations  Other (comment) (to be determined, but I believe he is pretty well-equipped)       Precautions / Restrictions Precautions Precautions: Fall Precaution Comments: Very painful RLE Restrictions Weight Bearing Restrictions: No    Mobility  Bed Mobility Overal bed mobility: Needs Assistance Bed Mobility: Rolling Rolling: Mod assist         General bed mobility comments: Heavy mod assist to roll for lift pad placement    Transfers Overall transfer level: Needs assistance               General transfer comment: Used Maximove for transfer OOB to chair      Balance Overall balance assessment: Needs assistance                                          Cognition Arousal/Alertness: Awake/alert Behavior During Therapy: WFL for tasks assessed/performed Overall Cognitive Status: No family/caregiver present to determine baseline cognitive functioning Area of  Impairment: Safety/judgement;Awareness                         Safety/Judgement: Decreased awareness of deficits Awareness: Emergent   General Comments: pt continues with inconsistent reports of his mobility and abilities at home      Exercises General Exercises - Lower Extremity Ankle Circles/Pumps: AAROM;Both;5 reps;Supine Hip Flexion/Marching: AROM;Both;10 reps;Seated    General Comments General comments (skin integrity, edema, etc.): Pt continues to prefer very flexed positioning of R LE in bed requiring increased time for streching prior to mobilization, once sitting in chair LE "hangs out" with improved knee extension      Pertinent Vitals/Pain Pain Assessment: Faces Faces Pain Scale: Hurts whole lot Pain Location: RLE esp in ankle and great toe Pain Descriptors / Indicators: Grimacing Pain Intervention(s): Limited activity within patient's tolerance;Monitored during session;Repositioned           PT Goals (current goals can now be found in the care plan section) Acute Rehab PT Goals Patient Stated Goal: wants to be home to play with his granddaughter PT Goal Formulation: With patient Time For Goal Achievement: 12/27/20 Potential to Achieve Goals: Fair Progress towards PT goals: Progressing toward goals (slowly)    Frequency    Min 2X/week      PT Plan Current plan remains appropriate    Co-evaluation PT/OT/SLP Co-Evaluation/Treatment: Yes Reason for Co-Treatment: Complexity  of the patient's impairments (multi-system involvement);For patient/therapist safety PT goals addressed during session: Mobility/safety with mobility;Strengthening/ROM        AM-PAC PT "6 Clicks" Mobility   Outcome Measure  Help needed turning from your back to your side while in a flat bed without using bedrails?: A Lot Help needed moving from lying on your back to sitting on the side of a flat bed without using bedrails?: A Lot Help needed moving to and from a bed to a  chair (including a wheelchair)?: A Lot Help needed standing up from a chair using your arms (e.g., wheelchair or bedside chair)?: A Lot Help needed to walk in hospital room?: Total Help needed climbing 3-5 steps with a railing? : Total 6 Click Score: 10    End of Session Equipment Utilized During Treatment: Gait belt Activity Tolerance: Patient tolerated treatment well Patient left: in chair;with call bell/phone within reach;with chair alarm set;Other (comment) (Lift pad underneath him,) Nurse Communication: Mobility status;Need for lift equipment;Other (comment) (left LE in dependent position for comfort as knee extension painful) PT Visit Diagnosis: Other abnormalities of gait and mobility (R26.89);Repeated falls (R29.6);Muscle weakness (generalized) (M62.81);Hemiplegia and hemiparesis Hemiplegia - Right/Left: Right Hemiplegia - dominant/non-dominant: Dominant Hemiplegia - caused by: Cerebral infarction     Time: 2094-7096 PT Time Calculation (min) (ACUTE ONLY): 34 min  Charges:  $Therapeutic Exercise: 8-22 mins                     Terryl Niziolek B. Beverely Risen PT, DPT Acute Rehabilitation Services Pager [phone] Office [phone]    Elon Alas Fleet 12/18/2020, 12:06 PM

## 2020-12-18 NOTE — Discharge Summary (Signed)
Physician Discharge Summary  NICKLOS GAXIOLA ZOX:[id number] DOB: May 18, 1935 DOA: 12/10/2020  PCP: Ardith Dark, MD  Admit date: 12/10/2020   Discharge date: 12/18/2020  Admitted From: Home.  Disposition:  SNF  Recommendations for Outpatient Follow-up:  1. Follow up with PCP in 1-2 weeks 2. Please obtain BMP/CBC in one week  Home Health: None Equipment/Devices: None  Discharge Condition: Stable CODE STATUS:Full code Diet recommendation: Heart Healthy   Brief Summary / Hospital Course: This 86 year old male with history of chronic leg edema, chronic right leg ulcer, hypertension, AAA, prior CVA with residual right-sided weakness was brought into the ED for right leg wound, nonhealing. Patient reports that he has been having more difficulties moving his right leg and has been so weak that he was unable to ambulate over the last few days. History was also obtained from his wife. She reports that over the last 3 to 4 days patient indeed has been nonambulatory because he is unable to move his right leg. She also reports that he has been more confused and having intermittent hallucinations which is very unusual for him, normally he is alert and orient x4. They initially called EMS to assist with recommendation from the wheelchair to bed, however on EMS evaluation he was disoriented to time, place, situation and brought to the hospital. Patient was admitted for altered mental status and generalized weakness.  He was also found to have right leg cellulitis but he was not septic.  Patient was started on vancomycin and cefepime.  Blood cultures were negative so far , antibiotics were changed to doxycycline and he has completed the course for 7 days. Patient is alert and back to his baseline mental status.  His renal functions back to baseline.  He was continued on Xarelto.  PT recommended skilled nursing facility.  Patient is being discharged to skilled nursing facility.  He was managed for  below problems.  Discharge Diagnoses:  Active Problems:   Hemiparesis affecting right side as late effect of cerebrovascular accident [facility])   Essential hypertension   Leg edema   Hyperlipidemia   Cellulitis of leg, right   Pulmonary vascular congestion   Rhabdomyolysis   Lactic acid acidosis   Pressure injury of skin  Right leg wound, cellulitis: > Improved. Patient was not septic on admission but there was concern for an infectious process given confusion.  He was started on vancomycin and cefepime, blood cultures have remained negative.  He was eventually transitioned to doxycycline, continue for total of 7 days, today day #6. He has completed antibiotics for 7 days.  Active Problems Acute metabolic encephalopathy /generalized weakness> Improved.. Patient presented with confusion at home, hallucinations, concern for infectious process versus other.  Mental status back to normal.  Wife agrees that he has improved with antibiotics to treat cellulitis. MRI of the brain was negative for acute strokes. PT recommends SNF to which wife and patient agreeable.  Social worker consulted, SNF pending, wife has not chosen facility yet and then will need insurance authorization.  Hypokalemia > resolved.  Continue to monitor.  Essential hypertension: Continue torsemide.  Chronic kidney disease stage III:> Baseline around 1.2 - 1.5, creatinine at baseline.  Chronic diastolic CHF:   Chest x-ray shows pulmonary vascular congestion.  Continue torsemide,on room air, clinically euvolemic.  Mild rhabdomyolysis-stable, CK improving.  Prior CVA with right-sided hemiparesis: patient at baseline can ambulate but has been unable to do so in the last 3 to 4 days, his right side appears to be much  weaker. MRI of the brain negative. Short-term rehab SNF pending  Hyperlipidemia : Continue rosuvastatin.  Prior recurrent VTE: Continue Xarelto   Discharge  Instructions  Discharge Instructions    Call MD for:  difficulty breathing, headache or visual disturbances   Complete by: As directed    Call MD for:  persistant dizziness or light-headedness   Complete by: As directed    Call MD for:  persistant nausea and vomiting   Complete by: As directed    Call MD for:  severe uncontrolled pain   Complete by: As directed    Diet - low sodium heart healthy   Complete by: As directed    Diet Carb Modified   Complete by: As directed    Discharge instructions   Complete by: As directed    Advised to follow-up with primary care physician in 1 week. Patient has completed antibiotic treatment for cellulitis.   Discharge wound care:   Complete by: As directed    Follow-up wound care at nursing home.   Increase activity slowly   Complete by: As directed      Allergies as of 12/18/2020      Reactions   Aspirin Other (See Comments)   Full Strength Asprin-325 mg causes upset stomach. Can take 81 mg.      Medication List    TAKE these medications   acetaminophen 650 MG CR tablet Commonly known as: TYLENOL Take 1,300 mg by mouth every 8 (eight) hours as needed for pain.   atorvastatin 40 MG tablet Commonly known as: LIPITOR TAKE 1 TABLET(40 MG) BY MOUTH EVERY EVENING What changed: See the new instructions.   brimonidine 0.15 % ophthalmic solution Commonly known as: ALPHAGAN Place 1 drop into both eyes daily.   dorzolamide 2 % ophthalmic solution Commonly known as: TRUSOPT Place 1 drop into both eyes 2 (two) times daily.   gabapentin 400 MG capsule Commonly known as: NEURONTIN TAKE 1 CAPSULE(400 MG) BY MOUTH TWICE DAILY What changed: See the new instructions.   latanoprost 0.005 % ophthalmic solution Commonly known as: XALATAN Place 1 drop into both eyes at bedtime.   oxybutynin 5 MG tablet Commonly known as: DITROPAN TAKE 1 TABLET(5 MG) BY MOUTH TWICE DAILY What changed: See the new instructions.   torsemide 10 MG  tablet Commonly known as: DEMADEX TAKE 2 TABLETS(20 MG) BY MOUTH DAILY What changed: See the new instructions.   Vitamin D (Ergocalciferol) 1.25 MG (50000 UNIT) Caps capsule Commonly known as: DRISDOL TAKE 1 CAPSULE BY MOUTH EVERY 7 DAYS What changed: when to take this   Xarelto 20 MG Tabs tablet Generic drug: rivaroxaban TAKE 1 TABLET(20 MG) BY MOUTH EVERY EVENING What changed: See the new instructions.            Discharge Care Instructions  (From admission, onward)         Start     Ordered   12/18/20 0000  Discharge wound care:       Comments: Follow-up wound care at nursing home.   12/18/20 1646          Contact information for follow-up providers    Ardith Dark, MD Follow up in 1 week(s).   Specialty: Family Medicine Contact information: [address] [place] [address] [phone]        Nahser, Deloris Ping, MD .   Specialty: Cardiology Contact information: [address] [place] [address] [phone]  Contact information for after-discharge care    Destination    [facility] .   Service: Skilled Nursing Contact information: [address] [place] [address] [phone]                 Allergies  Allergen Reactions  . Aspirin Other (See Comments)    Full Strength Asprin-325 mg causes upset stomach. Can take 81 mg.    Consultations:     Procedures/Studies: DG Chest 1 View  Result Date: 12/10/2020 CLINICAL DATA:  Altered mental status. EXAM: CHEST  1 VIEW COMPARISON:  Radiograph 08/23/2019. FINDINGS: Implanted monitoring device projects over the left chest wall. Mild cardiomegaly. Aortic atherosclerosis tortuosity. Asymmetry of the hemi thoraces is felt to be due to patient rotation. Subsegmental atelectasis in the right mid lung. Mild vascular redistribution without pulmonary edema. No significant pleural effusion. No pneumothorax. Bones are diffusely under  mineralized. IMPRESSION: 1. Mild cardiomegaly and vascular congestion. 2. Subsegmental atelectasis in the right mid lung. Electronically Signed   By: Narda Rutherford M.D.   On: 12/10/2020 19:42   DG Tibia/Fibula Right  Result Date: 12/10/2020 CLINICAL DATA:  Wound. EXAM: RIGHT TIBIA AND FIBULA - 2 VIEW COMPARISON:  None. FINDINGS: Divided lateral views of the lower extremity. AP views could not be obtained due to patient medical condition. The bones are diffusely under mineralized. No fracture, periosteal reaction, focal bone lesion or erosion. Knee osteoarthritis with a probable ossified intra-articular body posteriorly in a Baker cyst. Diffuse soft tissue edema. Vascular calcifications. IMPRESSION: 1. Diffuse soft tissue edema. No acute osseous abnormality. 2. Knee osteoarthritis with probable ossified intra-articular body in a Baker cyst. 3. Technically limited exam due to difficulties with positioning. Electronically Signed   By: Narda Rutherford M.D.   On: 12/10/2020 19:44   CT Head Wo Contrast  Result Date: 12/10/2020 CLINICAL DATA:  Mental status change. EXAM: CT HEAD WITHOUT CONTRAST TECHNIQUE: Contiguous axial images were obtained from the base of the skull through the vertex without intravenous contrast. COMPARISON:  Head CT 11/17/2019, brain MRI 02/05/2020 FINDINGS: Brain: Normal for age atrophy. Advanced chronic small vessel ischemic change. Remote lacunar infarcts in the basal ganglia. No intracranial hemorrhage, mass effect, or midline shift. No hydrocephalus. The basilar cisterns are patent. No evidence of territorial infarct or acute ischemia. No extra-axial or intracranial fluid collection. Vascular: No hyperdense vessel. Skull: No fracture or focal lesion. Sinuses/Orbits: Abnormal appearance of the right globe with high density in the dependent aspect of the posterior chamber, slightly progressed from prior MRI. There is also thickening and high density involving the superior ophthalmic  vein. Bilateral lens extraction. Paranasal sinuses and mastoid air cells are clear. Other: None. IMPRESSION: 1. No acute intracranial abnormality. 2. Advanced chronic small vessel ischemic change. 3. Abnormal appearance of the right globe with high density in the dependent aspect of the posterior chamber, slightly progressed from prior MRI. There is thickening and high density involving the superior ophthalmic vein that may represent chronic thrombus. This could be further assessed with orbital MRI based on clinical concern. Electronically Signed   By: Narda Rutherford M.D.   On: 12/10/2020 19:36   MR BRAIN WO CONTRAST  Result Date: 12/12/2020 CLINICAL DATA:  Provided history: Mental status change, unknown cause. Altered mental status. EXAM: MRI HEAD WITHOUT CONTRAST TECHNIQUE: Multiplanar, multiecho pulse sequences of the brain and surrounding structures were obtained without intravenous contrast. COMPARISON:  Prior head CT examinations 12/10/2020 and earlier. FINDINGS: Brain: Mild intermittent motion degradation. Mild cerebral and  cerebellar atrophy. Redemonstrated chronic hemorrhagic infarct within the left thalamus. Additional chronic lacunar infarcts again demonstrated within the bilateral basal ganglia and cerebellar hemispheres. Background advanced multifocal T2/FLAIR hyperintensity within the cerebral white matter, nonspecific but compatible with chronic small vessel ischemic disease. To a lesser degree, chronic small vessel ischemic changes are also present within the pons. As before, there are scattered supratentorial and infratentorial chronic microhemorrhages. There is no acute infarct. No evidence of intracranial mass. No extra-axial fluid collection. No midline shift. Vascular: Expected proximal arterial flow voids. Skull and upper cervical spine: No focal marrow lesion. Incompletely assessed cervical spondylosis Sinuses/Orbits: Abnormal appearance of the right globe with abnormal T2 intermediate  signal and T1 hyperintense signal within the mid to posterior aspect of the globe. Trace bilateral ethmoid and right maxillary sinus mucosal thickening. IMPRESSION: Mildly motion degraded exam. No evidence of acute intracranial abnormality. Stable non-contrast MRI appearance of the brain as compared to 02/05/2020. Redemonstrated chronic hemorrhagic infarct within the left thalamus. Chronic lacunar infarcts within the bilateral basal ganglia and cerebellar hemispheres. Background advanced chronic small vessel ischemic disease. Multiple supratentorial and infratentorial chronic microhemorrhages. Findings may reflect sequela of hypertensive microangiopathy and/or cerebral amyloid angiopathy. Mild generalized parenchymal atrophy. As before, there is an abnormal appearance of the right globe. T2 intermediate and T1 hyperintense signal abnormality within the mid to posterior aspect of the right globe. Findings may reflect sequela of retinal or choroid detachment. Given the appearance, an orbital mass is considered less likely, although not excluded. Ophthalmological consultation recommended if not already obtained. Electronically Signed   By: Jackey Loge DO   On: 12/12/2020 10:49   CUP PACEART REMOTE DEVICE CHECK  Result Date: 12/14/2020 ILR summary report received. Battery status OK. Normal device function. No new symptom, tachy, brady, or pause episodes. No new AF episodes. Monthly summary reports and ROV/PRN. LH    Subjective: Patient was seen and examined at bedside.  He is alert and oriented back to his baseline mental status.   He is awaiting discharge in the nursing home.  Insurance authorization approved,  patient is being discharged.  Discharge Exam: Vitals:   12/18/20 0523 12/18/20 0842  BP: 128/72 (!) 145/83  Pulse: (!) 56 71  Resp: 15 18  Temp: 98 F (36.7 C) 97.9 F (36.6 C)  SpO2: 99% 98%   Vitals:   12/17/20 1606 12/17/20 2109 12/18/20 0523 12/18/20 0842  BP: 128/61 122/69 128/72  (!) 145/83  Pulse: 72 74 (!) 56 71  Resp: 17 18 15 18   Temp: 97.7 F (36.5 C) 99.1 F (37.3 C) 98 F (36.7 C) 97.9 F (36.6 C)  TempSrc: Oral Oral Oral Oral  SpO2: 99% 100% 99% 98%  Weight:      Height:        General: Pt is alert, awake, not in acute distress Cardiovascular: RRR, S1/S2 +, no rubs, no gallops Respiratory: CTA bilaterally, no wheezing, no rhonchi Abdominal: Soft, NT, ND, bowel sounds + Extremities: no edema, no cyanosis    The results of significant diagnostics from this hospitalization (including imaging, microbiology, ancillary and laboratory) are listed below for reference.     Microbiology: Recent Results (from the past 240 hour(s))  Culture, blood (routine x 2)     Status: None   Collection Time: 12/10/20  5:32 PM   Specimen: BLOOD LEFT HAND  Result Value Ref Range Status   Specimen Description BLOOD LEFT HAND  Final   Special Requests   Final    BOTTLES DRAWN  AEROBIC AND ANAEROBIC Blood Culture adequate volume   Culture   Final    NO GROWTH 5 DAYS Performed at [facility], [address], [place], [place] [address]    Report Status 12/15/2020 FINAL  Final  Culture, blood (routine x 2)     Status: None   Collection Time: 12/10/20  5:32 PM   Specimen: BLOOD  Result Value Ref Range Status   Specimen Description BLOOD SITE NOT SPECIFIED  Final   Special Requests   Final    BOTTLES DRAWN AEROBIC AND ANAEROBIC Blood Culture results may not be optimal due to an inadequate volume of blood received in culture bottles   Culture   Final    NO GROWTH 5 DAYS Performed at [facility], [address], [place], [place] [address]    Report Status 12/16/2020 FINAL  Final  SARS CORONAVIRUS 2 (TAT 6-24 HRS) Nasopharyngeal Nasopharyngeal Swab     Status: None   Collection Time: 12/10/20  6:52 PM   Specimen: Nasopharyngeal Swab  Result Value Ref Range Status   SARS Coronavirus 2 NEGATIVE NEGATIVE Final    Comment: (NOTE) SARS-CoV-2 target nucleic  acids are NOT DETECTED.  The SARS-CoV-2 RNA is generally detectable in upper and lower respiratory specimens during the acute phase of infection. Negative results do not preclude SARS-CoV-2 infection, do not rule out co-infections with other pathogens, and should not be used as the sole basis for treatment or other patient management decisions. Negative results must be combined with clinical observations, patient history, and epidemiological information. The expected result is Negative.  Fact Sheet for Patients: HairSlick.no  Fact Sheet for Healthcare Providers: quierodirigir.com  This test is not yet approved or cleared by the [place] FDA and  has been authorized for detection and/or diagnosis of SARS-CoV-2 by FDA under an Emergency Use Authorization (EUA). This EUA will remain  in effect (meaning this test can be used) for the duration of the COVID-19 declaration under Se ction 564(b)(1) of the Act, 21 U.S.C. section 360bbb-3(b)(1), unless the authorization is terminated or revoked sooner.  Performed at [facility], [address] [place], [place] [address]   Urine culture     Status: Abnormal   Collection Time: 12/10/20  7:55 PM   Specimen: Urine, Random  Result Value Ref Range Status   Specimen Description URINE, RANDOM  Final   Special Requests NONE  Final   Culture (A)  Final    <10,000 COLONIES/mL INSIGNIFICANT GROWTH Performed at [facility], [address], [place], [place] [address]    Report Status 12/12/2020 FINAL  Final  Resp Panel by RT-PCR (Flu A&B, Covid) Nasopharyngeal Swab     Status: None   Collection Time: 12/18/20 10:42 AM   Specimen: Nasopharyngeal Swab; Nasopharyngeal(NP) swabs in vial transport medium  Result Value Ref Range Status   SARS Coronavirus 2 by RT PCR NEGATIVE NEGATIVE Final    Comment: (NOTE) SARS-CoV-2 target nucleic acids are NOT DETECTED.  The SARS-CoV-2 RNA  is generally detectable in upper respiratory specimens during the acute phase of infection. The lowest concentration of SARS-CoV-2 viral copies this assay can detect is 138 copies/mL. A negative result does not preclude SARS-Cov-2 infection and should not be used as the sole basis for treatment or other patient management decisions. A negative result may occur with  improper specimen collection/handling, submission of specimen other than nasopharyngeal swab, presence of viral mutation(s) within the areas targeted by this assay, and  inadequate number of viral copies(<138 copies/mL). A negative result must be combined with clinical observations, patient history, and epidemiological information. The expected result is Negative.  Fact Sheet for Patients:  BloggerCourse.comhttps://www.fda.gov/media/152166/download  Fact Sheet for Healthcare Providers:  SeriousBroker.ithttps://www.fda.gov/media/152162/download  This test is no t yet approved or cleared by the [place] FDA and  has been authorized for detection and/or diagnosis of SARS-CoV-2 by FDA under an Emergency Use Authorization (EUA). This EUA will remain  in effect (meaning this test can be used) for the duration of the COVID-19 declaration under Section 564(b)(1) of the Act, 21 U.S.C.section 360bbb-3(b)(1), unless the authorization is terminated  or revoked sooner.       Influenza A by PCR NEGATIVE NEGATIVE Final   Influenza B by PCR NEGATIVE NEGATIVE Final    Comment: (NOTE) The Xpert Xpress SARS-CoV-2/FLU/RSV plus assay is intended as an aid in the diagnosis of influenza from Nasopharyngeal swab specimens and should not be used as a sole basis for treatment. Nasal washings and aspirates are unacceptable for Xpert Xpress SARS-CoV-2/FLU/RSV testing.  Fact Sheet for Patients: BloggerCourse.comhttps://www.fda.gov/media/152166/download  Fact Sheet for Healthcare Providers: SeriousBroker.ithttps://www.fda.gov/media/152162/download  This test is not yet approved or cleared by the  [place] FDA and has been authorized for detection and/or diagnosis of SARS-CoV-2 by FDA under an Emergency Use Authorization (EUA). This EUA will remain in effect (meaning this test can be used) for the duration of the COVID-19 declaration under Section 564(b)(1) of the Act, 21 U.S.C. section 360bbb-3(b)(1), unless the authorization is terminated or revoked.  Performed at [facility], [address] [place], [place] [address]      Labs: BNP (last 3 results) No results for input(s): BNP in the last 8760 hours. Basic Metabolic Panel: Recent Labs  Lab 12/13/20 0431 12/14/20 0350 12/15/20 0424 12/17/20 0306 12/18/20 0534  NA 139 140 139 142 136  K 3.2* 3.4* 3.6 4.1 3.8  CL 105 105 103 101 100  CO2 28 28 27  32 28  GLUCOSE 118* 92 95 94 98  BUN 18 15 18 23 22   CREATININE 1.22 1.20 1.19 1.27* 1.08  CALCIUM 7.9* 8.1* 8.6* 8.8* 8.3*  MG 1.9  --   --   --  2.1  PHOS 3.0  --   --   --  2.9   Liver Function Tests: Recent Labs  Lab 12/13/20 0431  AST 38  ALT 20  ALKPHOS 44  BILITOT 0.8  PROT 6.2*  ALBUMIN 2.3*   No results for input(s): LIPASE, AMYLASE in the last 168 hours. No results for input(s): AMMONIA in the last 168 hours. CBC: Recent Labs  Lab 12/13/20 0431 12/14/20 0350 12/15/20 0424  WBC 6.7 6.2 7.4  HGB 12.3* 12.6* 13.8  HCT 37.5* 38.6* 41.8  MCV 95.9 97.0 96.8  PLT 155 160 195   Cardiac Enzymes: Recent Labs  Lab 12/13/20 0431  CKTOTAL 678*   BNP: Invalid input(s): POCBNP CBG: Recent Labs  Lab 12/11/20 2138  GLUCAP 101*   D-Dimer No results for input(s): DDIMER in the last 72 hours. Hgb A1c No results for input(s): HGBA1C in the last 72 hours. Lipid Profile No results for input(s): CHOL, HDL, LDLCALC, TRIG, CHOLHDL, LDLDIRECT in the last 72 hours. Thyroid function studies No results for input(s): TSH, T4TOTAL, T3FREE, THYROIDAB in the last 72 hours.  Invalid input(s): FREET3 Anemia work up No results for input(s):  VITAMINB12, FOLATE, FERRITIN, TIBC, IRON, RETICCTPCT in the last 72 hours. Urinalysis    Component Value  Date/Time   COLORURINE YELLOW 12/10/2020 2002   APPEARANCEUR CLEAR 12/10/2020 2002   LABSPEC 1.010 12/10/2020 2002   PHURINE 5.0 12/10/2020 2002   GLUCOSEU NEGATIVE 12/10/2020 2002   HGBUR SMALL (A) 12/10/2020 2002   BILIRUBINUR NEGATIVE 12/10/2020 2002   KETONESUR NEGATIVE 12/10/2020 2002   PROTEINUR NEGATIVE 12/10/2020 2002   NITRITE NEGATIVE 12/10/2020 2002   LEUKOCYTESUR NEGATIVE 12/10/2020 2002   Sepsis Labs Invalid input(s): PROCALCITONIN,  WBC,  LACTICIDVEN Microbiology Recent Results (from the past 240 hour(s))  Culture, blood (routine x 2)     Status: None   Collection Time: 12/10/20  5:32 PM   Specimen: BLOOD LEFT HAND  Result Value Ref Range Status   Specimen Description BLOOD LEFT HAND  Final   Special Requests   Final    BOTTLES DRAWN AEROBIC AND ANAEROBIC Blood Culture adequate volume   Culture   Final    NO GROWTH 5 DAYS Performed at [facility], [address] [place], [place] [address]    Report Status 12/15/2020 FINAL  Final  Culture, blood (routine x 2)     Status: None   Collection Time: 12/10/20  5:32 PM   Specimen: BLOOD  Result Value Ref Range Status   Specimen Description BLOOD SITE NOT SPECIFIED  Final   Special Requests   Final    BOTTLES DRAWN AEROBIC AND ANAEROBIC Blood Culture results may not be optimal due to an inadequate volume of blood received in culture bottles   Culture   Final    NO GROWTH 5 DAYS Performed at [facility], [address], [place], [place] [address]    Report Status 12/16/2020 FINAL  Final  SARS CORONAVIRUS 2 (TAT 6-24 HRS) Nasopharyngeal Nasopharyngeal Swab     Status: None   Collection Time: 12/10/20  6:52 PM   Specimen: Nasopharyngeal Swab  Result Value Ref Range Status   SARS Coronavirus 2 NEGATIVE NEGATIVE Final    Comment: (NOTE) SARS-CoV-2 target nucleic acids are NOT DETECTED.  The  SARS-CoV-2 RNA is generally detectable in upper and lower respiratory specimens during the acute phase of infection. Negative results do not preclude SARS-CoV-2 infection, do not rule out co-infections with other pathogens, and should not be used as the sole basis for treatment or other patient management decisions. Negative results must be combined with clinical observations, patient history, and epidemiological information. The expected result is Negative.  Fact Sheet for Patients: HairSlick.no  Fact Sheet for Healthcare Providers: quierodirigir.com  This test is not yet approved or cleared by the [place] FDA and  has been authorized for detection and/or diagnosis of SARS-CoV-2 by FDA under an Emergency Use Authorization (EUA). This EUA will remain  in effect (meaning this test can be used) for the duration of the COVID-19 declaration under Se ction 564(b)(1) of the Act, 21 U.S.C. section 360bbb-3(b)(1), unless the authorization is terminated or revoked sooner.  Performed at [facility], [address] [place], [place] [address]   Urine culture     Status: Abnormal   Collection Time: 12/10/20  7:55 PM   Specimen: Urine, Random  Result Value Ref Range Status   Specimen Description URINE, RANDOM  Final   Special Requests NONE  Final   Culture (A)  Final    <10,000 COLONIES/mL INSIGNIFICANT GROWTH Performed at [facility], [address], [place], [place] [address]    Report Status 12/12/2020 FINAL  Final  Resp Panel by RT-PCR (Flu A&B, Covid)  Nasopharyngeal Swab     Status: None   Collection Time: 12/18/20 10:42 AM   Specimen: Nasopharyngeal Swab; Nasopharyngeal(NP) swabs in vial transport medium  Result Value Ref Range Status   SARS Coronavirus 2 by RT PCR NEGATIVE NEGATIVE Final    Comment: (NOTE) SARS-CoV-2 target nucleic acids are NOT DETECTED.  The SARS-CoV-2 RNA is generally detectable in  upper respiratory specimens during the acute phase of infection. The lowest concentration of SARS-CoV-2 viral copies this assay can detect is 138 copies/mL. A negative result does not preclude SARS-Cov-2 infection and should not be used as the sole basis for treatment or other patient management decisions. A negative result may occur with  improper specimen collection/handling, submission of specimen other than nasopharyngeal swab, presence of viral mutation(s) within the areas targeted by this assay, and inadequate number of viral copies(<138 copies/mL). A negative result must be combined with clinical observations, patient history, and epidemiological information. The expected result is Negative.  Fact Sheet for Patients:  BloggerCourse.com  Fact Sheet for Healthcare Providers:  SeriousBroker.it  This test is no t yet approved or cleared by the [place] FDA and  has been authorized for detection and/or diagnosis of SARS-CoV-2 by FDA under an Emergency Use Authorization (EUA). This EUA will remain  in effect (meaning this test can be used) for the duration of the COVID-19 declaration under Section 564(b)(1) of the Act, 21 U.S.C.section 360bbb-3(b)(1), unless the authorization is terminated  or revoked sooner.       Influenza A by PCR NEGATIVE NEGATIVE Final   Influenza B by PCR NEGATIVE NEGATIVE Final    Comment: (NOTE) The Xpert Xpress SARS-CoV-2/FLU/RSV plus assay is intended as an aid in the diagnosis of influenza from Nasopharyngeal swab specimens and should not be used as a sole basis for treatment. Nasal washings and aspirates are unacceptable for Xpert Xpress SARS-CoV-2/FLU/RSV testing.  Fact Sheet for Patients: BloggerCourse.com  Fact Sheet for Healthcare Providers: SeriousBroker.it  This test is not yet approved or cleared by the [place] FDA and has been  authorized for detection and/or diagnosis of SARS-CoV-2 by FDA under an Emergency Use Authorization (EUA). This EUA will remain in effect (meaning this test can be used) for the duration of the COVID-19 declaration under Section 564(b)(1) of the Act, 21 U.S.C. section 360bbb-3(b)(1), unless the authorization is terminated or revoked.  Performed at [facility], [address] [place], [place] [address]      Time coordinating discharge: Over 30 minutes  SIGNED:   Cipriano Bunker, MD  [facility] 12/18/2020, 4:52 PM Pager   If 7PM-7AM, please contact night-coverage [email or web address] Password [email or web address]

## 2020-12-18 NOTE — Progress Notes (Signed)
Report given to [facility] nurse.

## 2020-12-18 NOTE — Progress Notes (Signed)
Occupational Therapy Treatment Patient Details Name: Steven Kent MRN: [id number] DOB: 1935-04-21 Today's Date: 12/18/2020    History of present illness 86 year old male with history of chronic leg edema, chronic right leg ulcer, hypertension, AAA, prior CVA with residual right-sided weakness was brought into the ED for right leg wound, nonhealing, and has been having more difficulties moving his right leg and has been so weak that he was unable to ambulate over the last few days leading up to admission; Wife reports incr confusion and hallucinations, which are NOT his baseline.   OT comments  Pt making slow progress with functional goals. Session focused on bed mobility rolling mod A to place lift pads for transfer. Pt required mod verbal and physical cues to use UE to reach for rails. Pt washed face and hands with set up and verbal cues for thoroughness, UB dressing, l UE A/AAROM in elbow and digit extension. OT will continue to follow acutely to maximize level of function and safety  Follow Up Recommendations  SNF;Supervision/Assistance - 24 hour    Equipment Recommendations  Other (comment) (TBD at SNF)    Recommendations for Other Services      Precautions / Restrictions Precautions Precautions: Fall Precaution Comments: Very painful RLE Restrictions Weight Bearing Restrictions: No       Mobility Bed Mobility Overal bed mobility: Needs Assistance Bed Mobility: Rolling Rolling: Mod assist     Sit to supine: Total assist;+2 for physical assistance   General bed mobility comments: Heavy mod assist to roll for lift pad placement    Transfers Overall transfer level: Needs assistance               General transfer comment: Used Maximove for transfer OOB to chair    Balance Overall balance assessment: Needs assistance                                         ADL either performed or assessed with clinical judgement   ADL Overall ADL's :  Needs assistance/impaired     Grooming: Wash/dry hands;Wash/dry face;Sitting Grooming Details (indicate cue type and reason): seated in recliner         Upper Body Dressing : Moderate assistance;Sitting;Cueing for sequencing         Toilet Transfer Details (indicate cue type and reason): Pt unable to stand or transfer at this time due to pain in RLE and weakness. Used Mechanical lift for bed - chair transfer           General ADL Comments: Pt very limited by pain in RLE; limited by impaired strenght, balance, endurance and cognition     Vision Baseline Vision/History: Wears glasses Wears Glasses: At all times Patient Visual Report: No change from baseline     Perception     Praxis      Cognition Arousal/Alertness: Awake/alert Behavior During Therapy: WFL for tasks assessed/performed Overall Cognitive Status: No family/caregiver present to determine baseline cognitive functioning Area of Impairment: Safety/judgement;Awareness                         Safety/Judgement: Decreased awareness of deficits Awareness: Emergent   General Comments: pt continues with inconsistent reports of his mobility and abilities at home        Exercises Exercises: General Lower Extremity General Exercises - Lower Extremity Ankle Circles/Pumps: AAROM;Both;5 reps;Supine Hip Flexion/Marching: AROM;Both;10 reps;Seated Other  Exercises Other Exercises: L UE A/AAROM elbow and digit extension seated in recliner   Shoulder Instructions       General Comments Pt continues to prefer very flexed positioning of R LE in bed requiring increased time for streching prior to mobilization, once sitting in chair LE "hangs out" with improved knee extension    Pertinent Vitals/ Pain       Pain Assessment: Faces Faces Pain Scale: Hurts whole lot Pain Location: RLE esp in ankle and great toe Pain Descriptors / Indicators: Grimacing Pain Intervention(s): Limited activity within patient's  tolerance;Monitored during session;Repositioned  Home Living                                          Prior Functioning/Environment              Frequency  Min 2X/week        Progress Toward Goals  OT Goals(current goals can now be found in the care plan section)  Progress towards OT goals: Progressing toward goals  Acute Rehab OT Goals Patient Stated Goal: wants to be home to play with his granddaughter  Plan Discharge plan remains appropriate    Co-evaluation    PT/OT/SLP Co-Evaluation/Treatment: Yes Reason for Co-Treatment: Complexity of the patient's impairments (multi-system involvement);For patient/therapist safety;To address functional/ADL transfers PT goals addressed during session: Mobility/safety with mobility;Strengthening/ROM OT goals addressed during session: ADL's and self-care;Proper use of Adaptive equipment and DME      AM-PAC OT "6 Clicks" Daily Activity     Outcome Measure   Help from another person eating meals?: None Help from another person taking care of personal grooming?: A Little Help from another person toileting, which includes using toliet, bedpan, or urinal?: Total Help from another person bathing (including washing, rinsing, drying)?: A Lot Help from another person to put on and taking off regular upper body clothing?: A Lot Help from another person to put on and taking off regular lower body clothing?: Total 6 Click Score: 13    End of Session Equipment Utilized During Treatment: Other (comment) (Used Maximove for transfer OOB to chair)  OT Visit Diagnosis: Unsteadiness on feet (R26.81);Other abnormalities of gait and mobility (R26.89);Other symptoms and signs involving the nervous system (R29.898);Hemiplegia and hemiparesis;Pain Hemiplegia - Right/Left: Right Hemiplegia - dominant/non-dominant: Dominant Hemiplegia - caused by: Cerebral infarction Pain - Right/Left: Right   Activity Tolerance Patient tolerated  treatment well   Patient Left with call bell/phone within reach;in chair;with chair alarm set   Nurse Communication          Time: 1443-1540 OT Time Calculation (min): 35 min  Charges: OT General Charges $OT Visit: 1 Visit OT Evaluation $OT Eval Moderate Complexity: 1 Mod    Galen Manila 12/18/2020, 1:55 PM

## 2020-12-18 NOTE — Progress Notes (Signed)
Tried to call report,  Was transferred to the nurse station and no answer.

## 2020-12-18 NOTE — Progress Notes (Deleted)
Occupational Therapy Treatment Patient Details Name: Steven Kent MRN: [id number] DOB: 08/22/1934 Today's Date: 12/18/2020    History of present illness 86 year old male with history of chronic leg edema, chronic right leg ulcer, hypertension, AAA, prior CVA with residual right-sided weakness was brought into the ED for right leg wound, nonhealing, and has been having more difficulties moving his right leg and has been so weak that he was unable to ambulate over the last few days leading up to admission; Wife reports incr confusion and hallucinations, which are NOT his baseline.   OT comments  Pt making slow progress with functional goals. Session focused on bed mobility rolling mod A to place lift pads for transfer. Pt required mod verbal and physical cues to use UE to reach for rails. Pt washed face and hands with set up and verbal cues for thoroughness, UB dressing, l UE A/AAROM in elbow and digit extension. OT will continue to follow acutely to maximize level of function and safety  Follow Up Recommendations  SNF;Supervision/Assistance - 24 hour    Equipment Recommendations  Other (comment) (TBD at SNF)    Recommendations for Other Services      Precautions / Restrictions Precautions Precautions: Fall Precaution Comments: Very painful RLE Restrictions Weight Bearing Restrictions: No       Mobility Bed Mobility Overal bed mobility: Needs Assistance Bed Mobility: Rolling Rolling: Mod assist     Sit to supine: Total assist;+2 for physical assistance   General bed mobility comments: Heavy mod assist to roll for lift pad placement    Transfers Overall transfer level: Needs assistance               General transfer comment: Used Maximove for transfer OOB to chair    Balance Overall balance assessment: Needs assistance                                         ADL either performed or assessed with clinical judgement   ADL Overall ADL's :  Needs assistance/impaired     Grooming: Wash/dry hands;Wash/dry face;Sitting Grooming Details (indicate cue type and reason): seated in recliner         Upper Body Dressing : Moderate assistance;Sitting;Cueing for sequencing         Toilet Transfer Details (indicate cue type and reason): Pt unable to stand or transfer at this time due to pain in RLE and weakness. Used Mechanical lift for bed - chair transfer           General ADL Comments: Pt very limited by pain in RLE; limited by impaired strenght, balance, endurance and cognition     Vision Baseline Vision/History: Wears glasses Wears Glasses: At all times Patient Visual Report: No change from baseline     Perception     Praxis      Cognition Arousal/Alertness: Awake/alert Behavior During Therapy: WFL for tasks assessed/performed Overall Cognitive Status: No family/caregiver present to determine baseline cognitive functioning Area of Impairment: Safety/judgement;Awareness                         Safety/Judgement: Decreased awareness of deficits Awareness: Emergent   General Comments: pt continues with inconsistent reports of his mobility and abilities at home        Exercises Exercises: General Lower Extremity General Exercises - Lower Extremity Ankle Circles/Pumps: AAROM;Both;5 reps;Supine Hip Flexion/Marching: AROM;Both;10 reps;Seated Other  Exercises Other Exercises: L UE A/AAROM elbow and digit extension seated in recliner   Shoulder Instructions       General Comments Pt continues to prefer very flexed positioning of R LE in bed requiring increased time for streching prior to mobilization, once sitting in chair LE "hangs out" with improved knee extension    Pertinent Vitals/ Pain       Pain Assessment: Faces Faces Pain Scale: Hurts whole lot Pain Location: RLE esp in ankle and great toe Pain Descriptors / Indicators: Grimacing Pain Intervention(s): Limited activity within patient's  tolerance;Monitored during session;Repositioned  Home Living                                          Prior Functioning/Environment              Frequency  Min 2X/week        Progress Toward Goals  OT Goals(current goals can now be found in the care plan section)  Progress towards OT goals: Progressing toward goals  Acute Rehab OT Goals Patient Stated Goal: wants to be home to play with his granddaughter  Plan Discharge plan remains appropriate    Co-evaluation    PT/OT/SLP Co-Evaluation/Treatment: Yes Reason for Co-Treatment: Complexity of the patient's impairments (multi-system involvement);For patient/therapist safety;To address functional/ADL transfers PT goals addressed during session: Mobility/safety with mobility;Strengthening/ROM OT goals addressed during session: ADL's and self-care;Proper use of Adaptive equipment and DME      AM-PAC OT "6 Clicks" Daily Activity     Outcome Measure   Help from another person eating meals?: None Help from another person taking care of personal grooming?: A Little Help from another person toileting, which includes using toliet, bedpan, or urinal?: Total Help from another person bathing (including washing, rinsing, drying)?: A Lot Help from another person to put on and taking off regular upper body clothing?: A Lot Help from another person to put on and taking off regular lower body clothing?: Total 6 Click Score: 13    End of Session Equipment Utilized During Treatment: Other (comment) (Used Maximove for transfer OOB to chair)  OT Visit Diagnosis: Unsteadiness on feet (R26.81);Other abnormalities of gait and mobility (R26.89);Other symptoms and signs involving the nervous system (R29.898);Hemiplegia and hemiparesis;Pain Hemiplegia - Right/Left: Right Hemiplegia - dominant/non-dominant: Dominant Hemiplegia - caused by: Cerebral infarction Pain - Right/Left: Right   Activity Tolerance Patient tolerated  treatment well   Patient Left with call bell/phone within reach;in chair;with chair alarm set   Nurse Communication          Time: 9798-9211 OT Time Calculation (min): 35 min  Charges:      Galen Manila 12/18/2020, 1:51 PM

## 2020-12-20 ENCOUNTER — Other Ambulatory Visit: Payer: Self-pay | Admitting: Family Medicine

## 2020-12-29 ENCOUNTER — Encounter: Payer: Self-pay | Admitting: Plastic Surgery

## 2020-12-29 ENCOUNTER — Other Ambulatory Visit: Payer: Self-pay

## 2020-12-29 ENCOUNTER — Ambulatory Visit (INDEPENDENT_AMBULATORY_CARE_PROVIDER_SITE_OTHER): Payer: Medicare Other | Admitting: Plastic Surgery

## 2020-12-29 VITALS — BP 128/73 | HR 71 | Ht 73.5 in | Wt 186.0 lb

## 2020-12-29 DIAGNOSIS — S81801A Unspecified open wound, right lower leg, initial encounter: Secondary | ICD-10-CM

## 2020-12-29 DIAGNOSIS — R600 Localized edema: Secondary | ICD-10-CM

## 2020-12-29 DIAGNOSIS — G6289 Other specified polyneuropathies: Secondary | ICD-10-CM | POA: Diagnosis not present

## 2020-12-29 NOTE — Progress Notes (Signed)
Carelink Summary Report / Loop Recorder 

## 2020-12-29 NOTE — Progress Notes (Signed)
Patient ID: Steven Kent, male    DOB: [birth year], 86 y.o.   MRN: [id number]   Chief Complaint  Patient presents with  . Advice Only  . Skin Problem    The patient is an 86 year old man here with his family for evaluation of his right lower leg.  He has a history of venous stasis ulcers.  They come and go.  His leg gets very swollen and then with elevation does better.  He has been getting Coban wraps which seem to have helped.  Right now he looks really good.  There is a very superficial opening on the lateral aspect of his right leg.  He has some wrinkling which is evidence that it had been quite swollen but now is reduced in size.  He denies any pain.  Overall he looks really good, nothing looks infected.  There is no redness.  His skin is dry.  The superficial area of opening on the lateral aspect is 5 mm in size.   Review of Systems  Constitutional: Positive for activity change and appetite change.  HENT: Negative.   Eyes: Negative.   Respiratory: Negative.   Cardiovascular: Positive for leg swelling.  Gastrointestinal: Negative for abdominal distention and abdominal pain.  Endocrine: Negative.   Genitourinary: Negative.   Musculoskeletal: Positive for gait problem.  Hematological: Negative.   Psychiatric/Behavioral: Negative.     Past Medical History:  Diagnosis Date  . Abdominal aortic aneurysm (AAA) (HCC)   . Hypertension   . Stroke [facility])     Past Surgical History:  Procedure Laterality Date  . CATARACT EXTRACTION, BILATERAL     rt eye 10/09/2014 lt eye 11/06/2014  . DENTAL SURGERY    . REFRACTIVE SURGERY  01/2015      Current Outpatient Medications:  .  acetaminophen (TYLENOL) 650 MG CR tablet, Take 1,300 mg by mouth every 8 (eight) hours as needed for pain., Disp: , Rfl:  .  atorvastatin (LIPITOR) 40 MG tablet, TAKE 1 TABLET(40 MG) BY MOUTH EVERY EVENING (Patient taking differently: Take 40 mg by mouth every evening.), Disp: 90 tablet, Rfl: 3 .   brimonidine (ALPHAGAN) 0.15 % ophthalmic solution, Place 1 drop into both eyes daily., Disp: , Rfl:  .  dorzolamide (TRUSOPT) 2 % ophthalmic solution, Place 1 drop into both eyes 2 (two) times daily., Disp: , Rfl:  .  gabapentin (NEURONTIN) 400 MG capsule, TAKE 1 CAPSULE(400 MG) BY MOUTH TWICE DAILY (Patient taking differently: Take 400 mg by mouth 2 (two) times daily.), Disp: 180 capsule, Rfl: 3 .  latanoprost (XALATAN) 0.005 % ophthalmic solution, Place 1 drop into both eyes at bedtime., Disp: , Rfl:  .  oxybutynin (DITROPAN) 5 MG tablet, TAKE 1 TABLET(5 MG) BY MOUTH TWICE DAILY (Patient taking differently: Take 5 mg by mouth 2 (two) times daily.), Disp: 180 tablet, Rfl: 3 .  torsemide (DEMADEX) 10 MG tablet, TAKE 2 TABLETS(20 MG) BY MOUTH DAILY (Patient taking differently: Take 20 mg by mouth daily.), Disp: 60 tablet, Rfl: 0 .  Vitamin D, Ergocalciferol, (DRISDOL) 1.25 MG (50000 UNIT) CAPS capsule, TAKE 1 CAPSULE BY MOUTH EVERY 7 DAYS, Disp: 12 capsule, Rfl: 3 .  XARELTO 20 MG TABS tablet, TAKE 1 TABLET(20 MG) BY MOUTH EVERY EVENING (Patient taking differently: Take 20 mg by mouth daily with supper.), Disp: 90 tablet, Rfl: 3   Objective:   Vitals:   12/29/20 1343  BP: 128/73  Pulse: 71  SpO2: 100%    Physical Exam Vitals  and nursing note reviewed.  Constitutional:      Appearance: Normal appearance.  HENT:     Head: Normocephalic and atraumatic.  Cardiovascular:     Rate and Rhythm: Normal rate.     Pulses: Normal pulses.  Pulmonary:     Effort: Pulmonary effort is normal.  Skin:    General: Skin is warm.     Capillary Refill: Capillary refill takes less than 2 seconds.  Neurological:     Mental Status: He is alert. Mental status is at baseline.  Psychiatric:        Mood and Affect: Mood normal.     Assessment & Plan:  Leg edema  Other polyneuropathy  Wound of right lower extremity, initial encounter  I think the patient would benefit from wound care consult for Unna  boots and ongoing documentation for the swelling.  He would be a good candidate for juxta lite compression.  The family and the patient are in agreement.  In the meantime continue with the current therapy of Vaseline and an Ace wrap.  Pictures were obtained of the patient and placed in the chart with the patient's or guardian's permission.   Alena Bills Sandy Blouch, DO

## 2021-01-26 ENCOUNTER — Encounter: Payer: Self-pay | Admitting: Family

## 2021-01-26 ENCOUNTER — Other Ambulatory Visit: Payer: Self-pay

## 2021-01-26 ENCOUNTER — Ambulatory Visit (INDEPENDENT_AMBULATORY_CARE_PROVIDER_SITE_OTHER): Payer: Medicare Other | Admitting: Family

## 2021-01-26 VITALS — BP 132/72 | HR 56 | Ht 73.5 in

## 2021-01-26 DIAGNOSIS — Z7901 Long term (current) use of anticoagulants: Secondary | ICD-10-CM

## 2021-01-26 DIAGNOSIS — I714 Abdominal aortic aneurysm, without rupture, unspecified: Secondary | ICD-10-CM

## 2021-01-26 DIAGNOSIS — I10 Essential (primary) hypertension: Secondary | ICD-10-CM

## 2021-01-26 DIAGNOSIS — N1832 Chronic kidney disease, stage 3b: Secondary | ICD-10-CM

## 2021-01-26 DIAGNOSIS — Z9889 Other specified postprocedural states: Secondary | ICD-10-CM | POA: Diagnosis not present

## 2021-01-26 DIAGNOSIS — Z8673 Personal history of transient ischemic attack (TIA), and cerebral infarction without residual deficits: Secondary | ICD-10-CM

## 2021-01-26 NOTE — Patient Instructions (Addendum)
Medication Instructions:  Continue your current medications.   *If you need a refill on your cardiac medications before your next appointment, please call your pharmacy*   Lab Work: None ordered today  If you have labs (blood work) drawn today and your tests are completely normal, you will receive your results only by: MyChart Message (if you have MyChart) OR A paper copy in the mail If you have any lab test that is abnormal or we need to change your treatment, we will call you to review the results.   Testing/Procedures: None ordered today. Your remote device checks have been good!   Follow-Up: At [facility], you and your health needs are our priority.  As part of our continuing mission to provide you with exceptional heart care, we have created designated Provider Care Teams.  These Care Teams include your primary Cardiologist (physician) and Advanced Practice Providers (APPs -  Physician Assistants and Nurse Practitioners) who all work together to provide you with the care you need, when you need it.  We recommend signing up for the patient portal called "MyChart".  Sign up information is provided on this After Visit Summary.  MyChart is used to connect with patients for Virtual Visits (Telemedicine).  Patients are able to view lab/test results, encounter notes, upcoming appointments, etc.  Non-urgent messages can be sent to your provider as well.   To learn more about what you can do with MyChart, go to [email or web address].    Your next appointment:   6 month(s)   The format for your next appointment:   In Person  Provider:   Dr. Graciela Husbands   Other Instructions  To prevent or reduce lower extremity swelling: Eat a low salt diet. Salt makes the body hold onto extra fluid which causes swelling. Sit with legs elevated. For example, in the recliner or on an ottoman.  Wear knee-high compression stockings during the daytime. Ones labeled 15-20 mmHg provide good  compression.

## 2021-01-26 NOTE — Progress Notes (Signed)
Office Visit    Patient Name: Steven Kent Date of Encounter: 01/26/2021  PCP:  Ardith Dark, MD   [facility]  Cardiologist:  Kristeen Miss, MD  Advanced Practice Provider:  No care team member to display Electrophysiologist:  None   Chief Complaint    Steven Kent is a 86 y.o. 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 Can take 81 mg.    History of Present Illness    Steven Kent is a 86 y.o. 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 Graciela Husbands.  Previously followed by Dr. Graciela Husbands for follow-up of spells of seizure-like activity, abrupt loss of consciousness.  72-hour EEG and tender EEG were normal.  Brain MRI showed chronic infarct and angioplasty with no specific seizure diagnosis.  He had loop recorder inserted October 2021.  Remote device checks have been without acute finding.  He was hospitalized 12/10/2020 with RLE cellulitis without sepsis, altered mental status, generalized sepsis.  MRI brain with no evidence of acute stroke.  He was discharged 12/18/2020 discussed nursing facility.  Presents today for follow-up with his wife.  He remains at [facility]  skilled nursing facility.  Enjoys working physical therapy.  Reports no chest pain, pressure, tightness.  Reports no shortness of breath at rest nor dyspnea on exertion.  Denies lightheadedness, dizziness, near syncope, syncope. He does note persistent right lower extremity edema.  It is wrapped today by SNF staff.  We discussed the importance of keeping his lower extremity elevated.  He reports some neuropathic pain for which he has been started on gabapentin by the skilled nursing facility physician.  EKGs/Labs/Other Studies Reviewed:   The following studies were reviewed today:  EKG:  No EKG today  Recent Labs: 06/15/2020: TSH 2.34 12/13/2020: ALT 20 12/15/2020: Hemoglobin 13.8; Platelets 195 12/18/2020: BUN 22; Creatinine, Ser 1.08; Magnesium 2.1; Potassium 3.8; Sodium 136  Recent Lipid Panel    Component Value Date/Time   CHOL 123 06/15/2020 1016   TRIG 48 06/15/2020 1016   HDL 47 06/15/2020 1016   CHOLHDL 2.6 06/15/2020 1016   VLDL 10.4 02/12/2019 1227   LDLCALC 62 06/15/2020 1016    Home Medications   No outpatient medications have been marked as taking for the 01/26/21 encounter (Appointment) with Alver Sorrow, NP.     Review of Systems    All other systems reviewed and are otherwise negative except as noted above.  Physical Exam    VS:  There were no vitals taken for this visit. , BMI There is no height or weight on file to calculate BMI.  Wt Readings from Last 3 Encounters:  12/29/20 186 lb (84.4 kg)  12/16/20 178 lb 2.1 oz (80.8 kg)  12/02/20 196 lb (88.9 kg)    GEN: Well nourished, well developed, in no acute distress. HEENT: normal. Neck: Supple, no JVD, carotid bruits, or masses. Cardiac: bradycardic, RRR, no murmurs, rubs, or gallops. No clubbing, cyanosis, edema.   Respiratory:  Respirations regular and unlabored, clear to auscultation bilaterally. GI: Soft, nontender, nondistended. MS: No deformity or atrophy. Skin: Warm and dry, no rash. RLE with  gauze, ACE, and foam pad. RLE toes with cracking.  Neuro:  Strength and sensation are intact. Psych: Normal affect.  Assessment & Plan    History of CVA- Continue statin, Xarelto. No aspirin due to allergy. Persistent right sided weakness, working with PT at [facility].  S/p ILR - upcoming remote device check 02/08/21. No evidence of arrhythmia.  RLE wound, cellulitis - Wrapped on exam today. Continue to follow wound care plan per SNF physician.   Recurrent VTE on Xarelto - Denies bleeding complications. Continue Xarelto 20mg  daily. Does not meet dose reduction criteria.   Chronic diastolic heart failure-Echo 08/21/2019 LVEF 55 to 60%, moderately increased LVH, grade 1 diastolic dysfunction, trivial TR, tricuspid aortic valve with mild regurgitation and no stenosis, mildly dilation of aortic root 3.8cm. Euvolemic and well compensated on exam. Continue present Torsemide dose.   CKD 3 -Baseline creatinine 1.2-1.5. Careful titration of diuretic and antihypertensive.   HTN- BP well controlled. Continue current antihypertensive regimen.   Hyperlipidemia- LDL goal <70. 06/2020 LDL 62. Continue present Atorvastatin 40mg  daily.  AAA-duplex 10/30/2020 measurement 5cm. Follows with [facility].    Disposition: Follow up in 6 month(s) with Dr.   Signed, 12/30/2020, NP 01/26/2021, 7:30 AM [facility]

## 2021-02-10 IMAGING — CT CT HEAD W/O CM
4 series · 15 of 47 positions shown, 17 images · non-contrast
Comparison: None.

CLINICAL DATA: Apnea

EXAM:
CT HEAD WITHOUT CONTRAST
TECHNIQUE: Contiguous axial images were obtained from the base of the skull
through the vertex without intravenous contrast.

[Series 3: head without ax · axial · non-contrast · 0.37mm/px · z∈[-125,+4]mm · 7 of 36 slices shown, 9 images]
[im 5/36  brain]
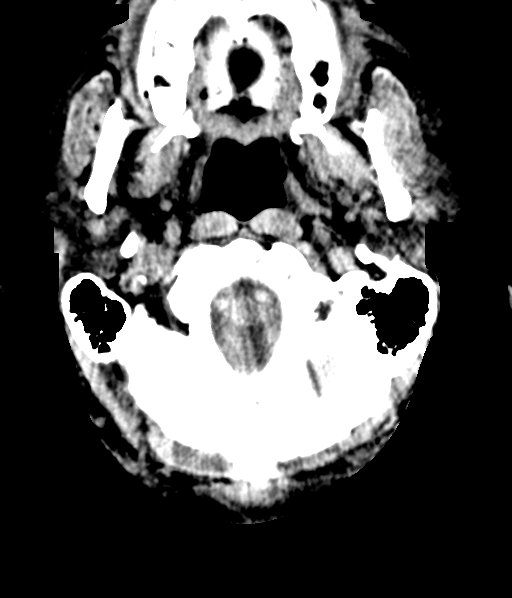
[im 5/36  bone]
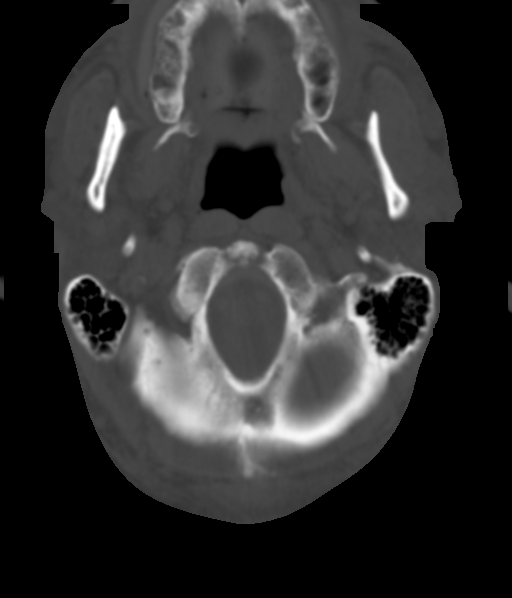
[im 9/36  brain]
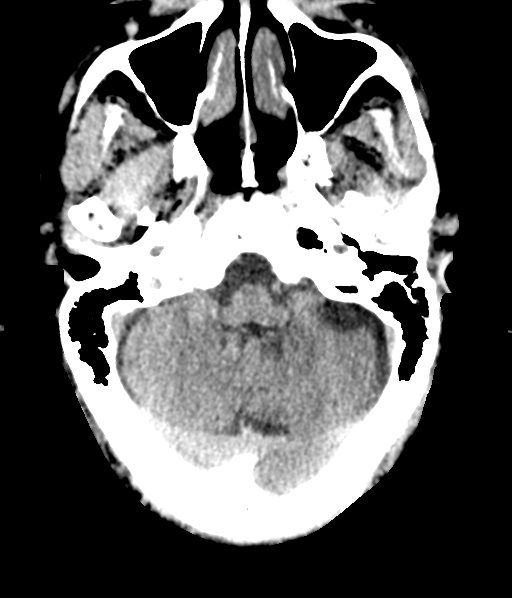
[im 14/36  brain]
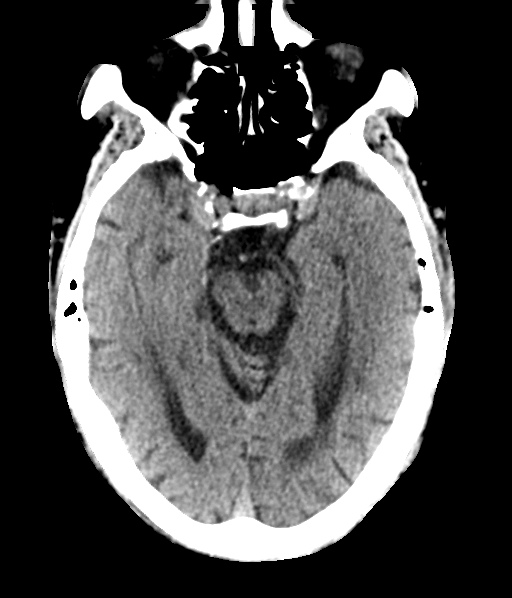
[im 18/36  brain]
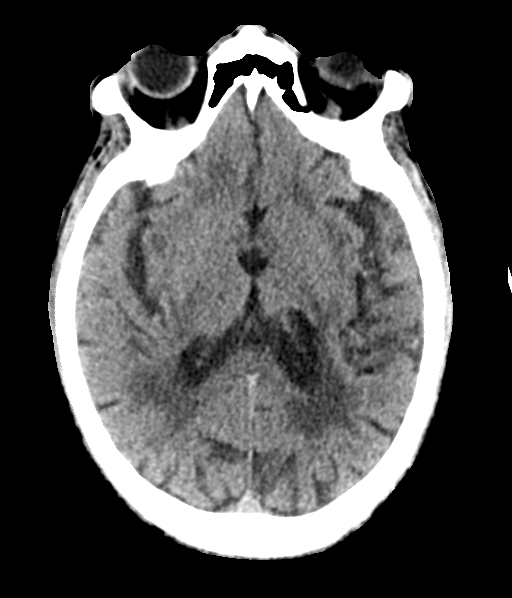
[im 22/36  brain]
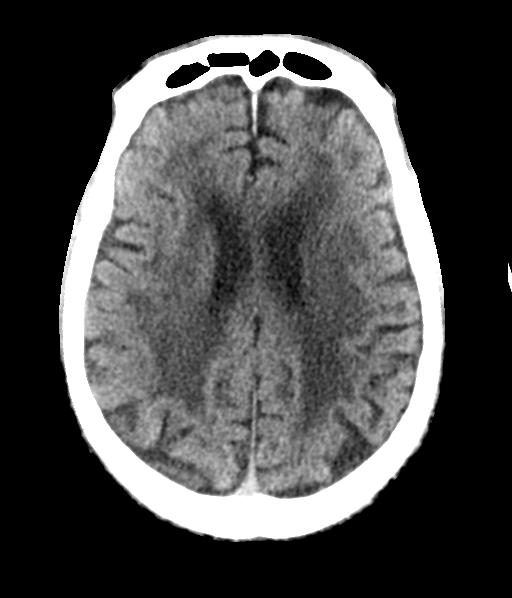
[im 22/36  bone]
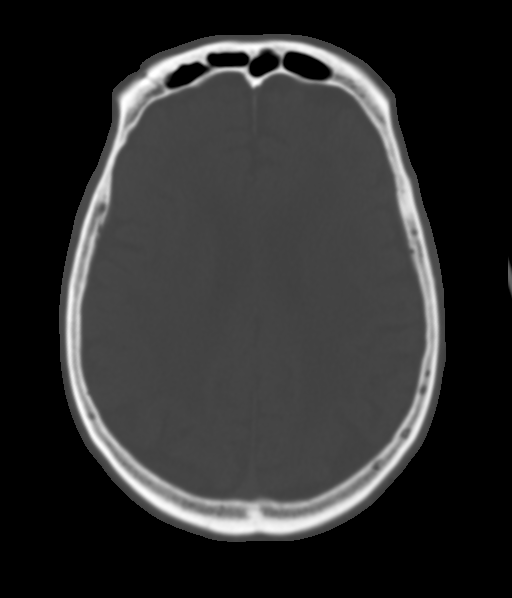
[im 27/36  brain]
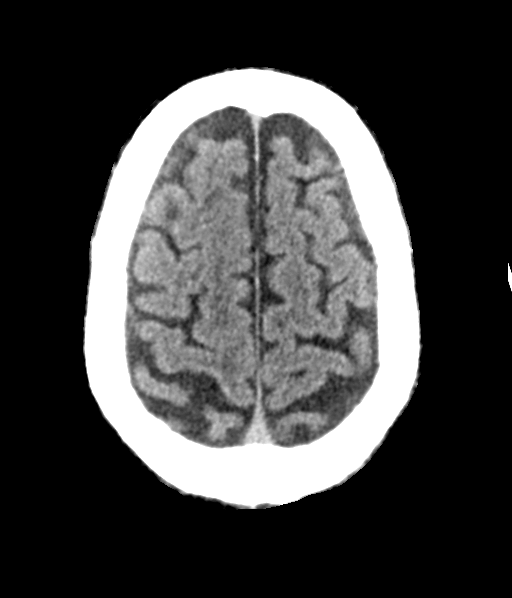
[im 31/36  brain]
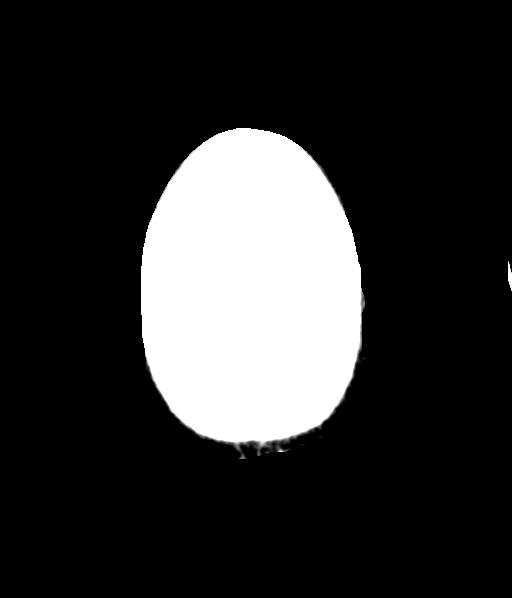

[Series 4: head bone · axial · 0.46mm/px · z∈[-137,-119]mm · 2 of 87 slices shown]
[im 9/87  bone]
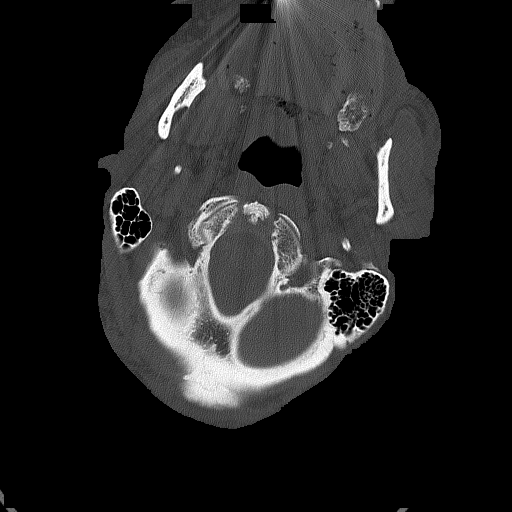
[im 18/87  bone]
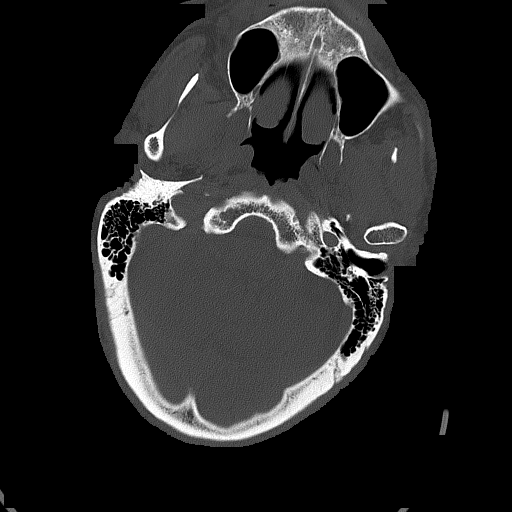

[Series 5: head without cor · coronal · non-contrast · 0.35mm/px · 3 of 74 slices shown]
[im 25/74  brain]
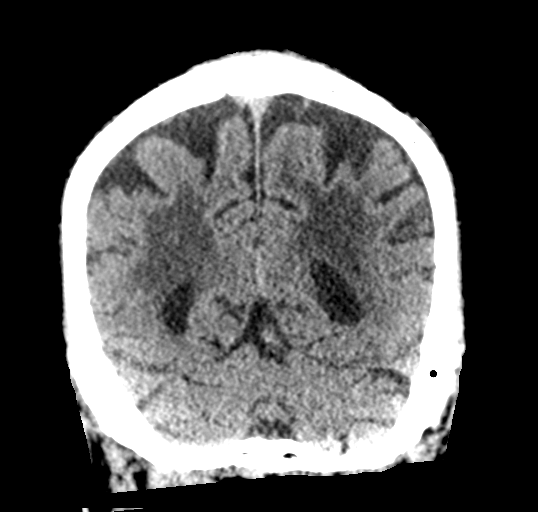
[im 33/74  brain]
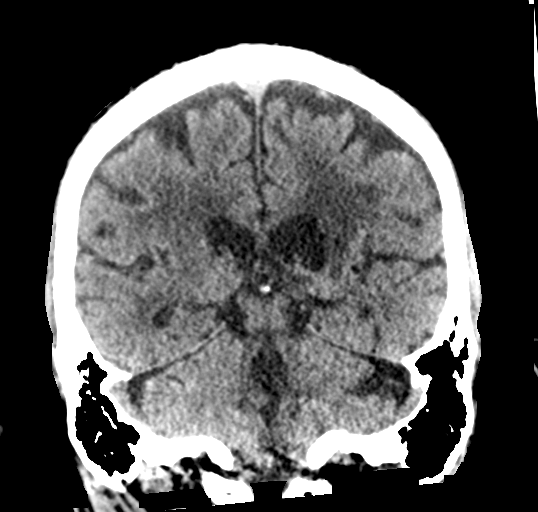
[im 41/74  brain]
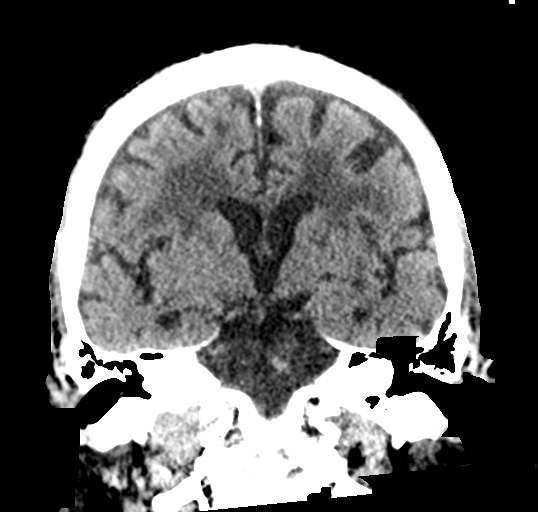

[Series 6: head without sag · sagittal · non-contrast · 0.36mm/px · 3 of 63 slices shown]
[im 21/63  brain]
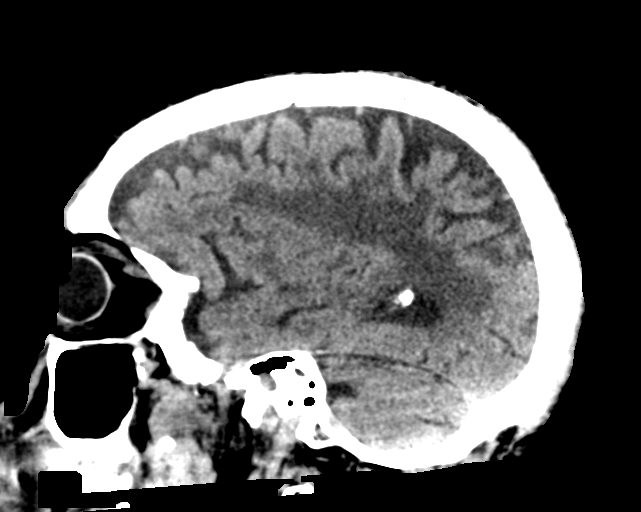
[im 32/63  brain]
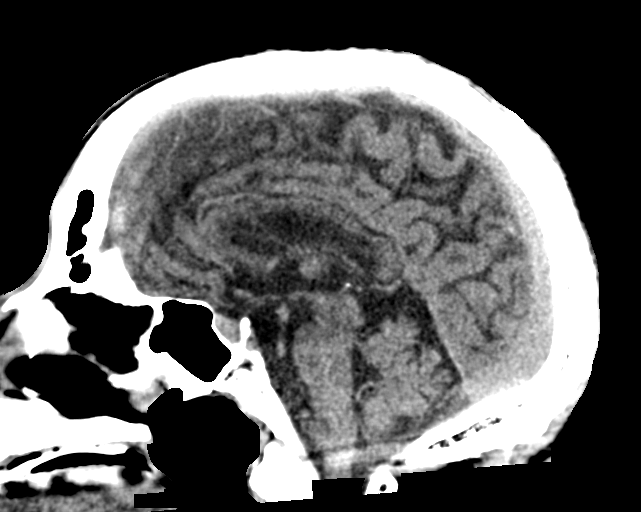
[im 42/63  brain]
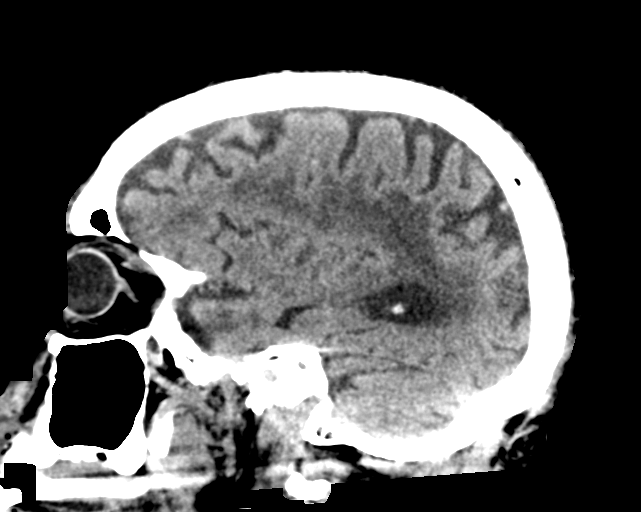

[15 of 47 positions shown; findings below may reference images not displayed]

FINDINGS: Brain: No acute territorial infarction, hemorrhage, or intracranial
mass. Extensive hypodensity within the periventricular, subcortical
and deep white matter likely chronic small vessel ischemic change.
Age indeterminate lacunar infarct left basal ganglia. Mild atrophy.
Nonenlarged ventricles

Vascular: No hyperdense vessels.  Carotid vascular calcification

Skull: Normal. Negative for fracture or focal lesion.

Sinuses/Orbits: No acute finding.

Other: None
IMPRESSION: 1. Negative for intracranial hemorrhage or intracranial mass.
2. Age indeterminate lacunar infarct left basal ganglia
3. Atrophy and extensive hypodensity in the white matter, likely
chronic small vessel ischemic change

## 2021-02-10 IMAGING — DX DG CHEST 1V PORT
1 series · 1 of 1 positions shown · non-contrast
Comparison: 08/02/2018

CLINICAL DATA: Shortness of breath

EXAM:
PORTABLE CHEST 1 VIEW

[chest]
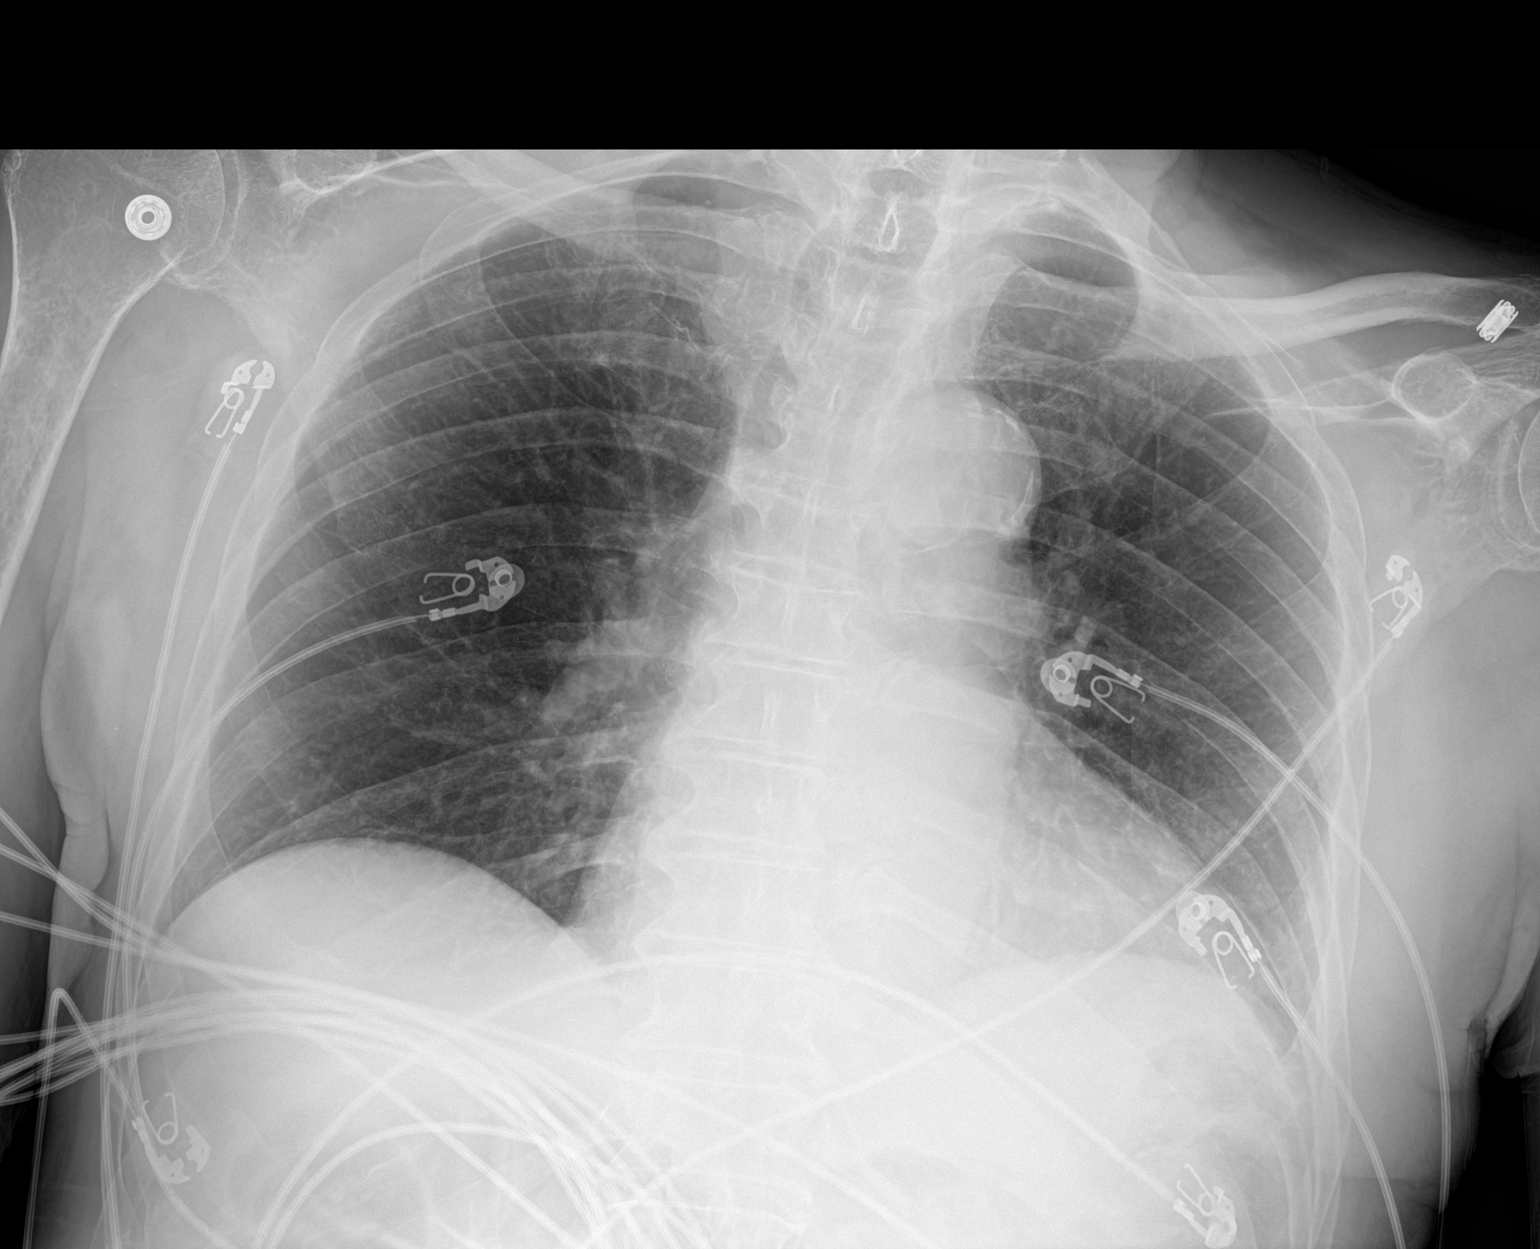

[1 of 1 positions shown; findings below may reference images not displayed]

FINDINGS: Low lung volumes. Mild cardiomegaly without consolidation, pleural
effusion or pneumothorax. Aortic atherosclerosis.
IMPRESSION: No active disease.  Mild cardiomegaly

## 2021-02-17 LAB — IRON,TIBC AND FERRITIN PANEL
%SAT: 28
Ferritin: 252
Iron: 67
TIBC: 236
UIBC: 169

## 2021-02-17 LAB — BASIC METABOLIC PANEL
BUN: 17 (ref 4–21)
CO2: 32 — AB (ref 13–22)
Chloride: 102 (ref 99–108)
Creatinine: 1.1 (ref 0.6–1.3)
Glucose: 97
Potassium: 4.3 (ref 3.4–5.3)
Sodium: 141 (ref 137–147)

## 2021-02-17 LAB — COMPREHENSIVE METABOLIC PANEL
Albumin: 3.7 (ref 3.5–5.0)
Calcium: 8.9 (ref 8.7–10.7)
GFR calc non Af Amer: 65

## 2021-02-17 LAB — CBC AND DIFFERENTIAL
HCT: 35 — AB (ref 41–53)
Hemoglobin: 12 — AB (ref 13.5–17.5)
Neutrophils Absolute: 2.5
Platelets: 160 (ref 150–399)
WBC: 5.4

## 2021-02-17 LAB — CBC: RBC: 3.81 — AB (ref 3.87–5.11)

## 2021-03-05 ENCOUNTER — Ambulatory Visit: Payer: Medicare Other

## 2021-03-11 ENCOUNTER — Encounter: Payer: Self-pay | Admitting: Family Medicine

## 2021-04-29 ENCOUNTER — Other Ambulatory Visit: Payer: Self-pay | Admitting: *Deleted

## 2021-04-29 DIAGNOSIS — I10 Essential (primary) hypertension: Secondary | ICD-10-CM

## 2021-04-30 ENCOUNTER — Telehealth: Payer: Self-pay

## 2021-04-30 NOTE — Chronic Care Management (AMB) (Signed)
  Chronic Care Management   Note  04/30/2021 Name: ALDRIN ENGELHARD MRN: [id number] DOB: [birth year]  JENESIS SUCHY is a 86 y.o. year old male who is a primary care patient of Vivi Barrack, MD. I reached out to Mariane Baumgarten by phone today in response to a referral sent by Mr. Carlean Jews Leathers's PCP.  Mr. Guyton was given information about Chronic Care Management services today including:  CCM service includes personalized support from designated clinical staff supervised by his physician, including individualized plan of care and coordination with other care providers 24/7 contact phone numbers for assistance for urgent and routine care needs. Service will only be billed when office clinical staff spend 20 minutes or more in a month to coordinate care. Only one practitioner may furnish and bill the service in a calendar month. The patient may stop CCM services at any time (effective at the end of the month) by phone call to the office staff. The patient is responsible for co-pay (up to 20% after annual deductible is met) if co-pay is required by the individual health plan.   Patient did not agree to enrollment in care management services and does not wish to consider at this time.  Follow up plan: Patient's spouse declines engagement by the care management team. Patient is currently in nursing home Appropriate care team members and provider have been notified via electronic communication.   Noreene Larsson, [place], [place], [place] [address] Direct Dial: [phone] [email or web address]

## 2021-05-04 ENCOUNTER — Other Ambulatory Visit (HOSPITAL_COMMUNITY): Payer: Medicare Other

## 2021-05-04 ENCOUNTER — Ambulatory Visit: Payer: Medicare Other | Admitting: Vascular Surgery

## 2021-05-05 ENCOUNTER — Inpatient Hospital Stay: Payer: Medicare Other | Admitting: Family Medicine

## 2021-05-07 ENCOUNTER — Inpatient Hospital Stay: Payer: Medicare Other | Admitting: Family Medicine

## 2021-06-02 ENCOUNTER — Other Ambulatory Visit: Payer: Self-pay

## 2021-06-02 ENCOUNTER — Encounter: Payer: Self-pay | Admitting: Vascular Surgery

## 2021-06-02 ENCOUNTER — Ambulatory Visit (HOSPITAL_COMMUNITY)
Admission: RE | Admit: 2021-06-02 | Discharge: 2021-06-02 | Disposition: A | Discharge status: Home / Self Care | Payer: Medicare Other | Source: Ambulatory Visit | Attending: Vascular Surgery | Admitting: Vascular Surgery

## 2021-06-02 ENCOUNTER — Ambulatory Visit (INDEPENDENT_AMBULATORY_CARE_PROVIDER_SITE_OTHER): Payer: Medicare Other | Admitting: Vascular Surgery

## 2021-06-02 VITALS — BP 131/71 | HR 61 | Temp 98.0°F | Resp 20

## 2021-06-02 DIAGNOSIS — I714 Abdominal aortic aneurysm, without rupture, unspecified: Secondary | ICD-10-CM

## 2021-06-02 DIAGNOSIS — I7143 Infrarenal abdominal aortic aneurysm, without rupture: Secondary | ICD-10-CM | POA: Diagnosis not present

## 2021-06-02 NOTE — Progress Notes (Signed)
Patient ID: Steven Kent, male   DOB: 09-18-1934, 86 y.o.   MRN: [id number]  Reason for Consult: Follow-up   Referred by Ardith Dark, MD  Subjective:     HPI:  Steven Kent is a 86 y.o. male has a history of abdominal aortic aneurysm.  He has been followed by Dr. Arbie Cookey.  He currently lives in a nursing home.  He is not ambulatory.  Patient has never had surgery before.  He has no known family history of aneurysm disease.  He does not have any new back or abdominal pain.  He takes statin and Xarelto.  Past Medical History:  Diagnosis Date   Abdominal aortic aneurysm (AAA)    Hypertension    Stroke [facility])    Family History  Problem Relation Age of Onset   Prostate cancer Neg Hx    Past Surgical History:  Procedure Laterality Date   CATARACT EXTRACTION, BILATERAL     rt eye 10/09/2014 lt eye 11/06/2014   DENTAL SURGERY     REFRACTIVE SURGERY  01/2015    Short Social History:  Social History   Tobacco Use   Smoking status: Former   Smokeless tobacco: Never  Substance Use Topics   Alcohol use: Never    Allergies  Allergen Reactions   Aspirin Other (See Comments)    Full Strength Asprin-325 mg causes upset stomach. Can take 81 mg.    Current Outpatient Medications  Medication Sig Dispense Refill   acetaminophen (TYLENOL) 650 MG CR tablet Take 1,300 mg by mouth every 8 (eight) hours as needed for pain.     atorvastatin (LIPITOR) 40 MG tablet TAKE 1 TABLET(40 MG) BY MOUTH EVERY EVENING (Patient taking differently: Take 40 mg by mouth every evening.) 90 tablet 3   brimonidine (ALPHAGAN) 0.15 % ophthalmic solution Place 1 drop into both eyes daily.     dorzolamide (TRUSOPT) 2 % ophthalmic solution Place 1 drop into both eyes 2 (two) times daily.     gabapentin (NEURONTIN) 300 MG capsule Take 300 mg by mouth in the morning.     gabapentin (NEURONTIN) 600 MG tablet Take 600 mg by mouth at bedtime.     latanoprost (XALATAN) 0.005 % ophthalmic solution Place 1  drop into both eyes at bedtime.     oxybutynin (DITROPAN) 5 MG tablet TAKE 1 TABLET(5 MG) BY MOUTH TWICE DAILY (Patient taking differently: Take 5 mg by mouth 2 (two) times daily.) 180 tablet 3   oxybutynin (DITROPAN-XL) 5 MG 24 hr tablet Take 5 mg by mouth daily.     torsemide (DEMADEX) 20 MG tablet Take 20 mg by mouth daily.     Vitamin D, Ergocalciferol, (DRISDOL) 1.25 MG (50000 UNIT) CAPS capsule TAKE 1 CAPSULE BY MOUTH EVERY 7 DAYS 12 capsule 3   XARELTO 20 MG TABS tablet TAKE 1 TABLET(20 MG) BY MOUTH EVERY EVENING (Patient taking differently: Take 20 mg by mouth daily with supper.) 90 tablet 3   Acetaminophen-Codeine 300-30 MG tablet Take 1 tablet by mouth daily as needed. (Patient not taking: Reported on 06/02/2021)     No current facility-administered medications for this visit.    Review of Systems  Constitutional:  Constitutional negative. HENT: HENT negative.  Eyes: Eyes negative.  Respiratory: Respiratory negative.  Cardiovascular: Positive for leg swelling.  GI: Gastrointestinal negative.  Musculoskeletal: Positive for leg pain.  Skin: Positive for wound.  Neurological: Neurological negative. Hematologic: Hematologic/lymphatic negative.  Psychiatric: Psychiatric negative.       Objective:  Objective   Vitals:   06/02/21 0944  BP: 131/71  Pulse: 61  Resp: 20  Temp: 98 F (36.7 C)  SpO2: 97%   There is no height or weight on file to calculate BMI.  Physical Exam HENT:     Head: Normocephalic.     Nose:     Comments: Wearing a mask Eyes:     Pupils: Pupils are equal, round, and reactive to light.  Cardiovascular:     Pulses:          Radial pulses are 2+ on the right side and 2+ on the left side.  Abdominal:     General: Abdomen is flat.     Palpations: Abdomen is soft.  Musculoskeletal:        General: Normal range of motion.     Cervical back: Normal range of motion.     Right lower leg: Edema present.     Left lower leg: No edema.  Skin:     General: Skin is warm and dry.     Capillary Refill: Capillary refill takes less than 2 seconds.  Neurological:     General: No focal deficit present.     Mental Status: He is alert.  Psychiatric:        Mood and Affect: Mood normal.        Behavior: Behavior normal.        Thought Content: Thought content normal.        Judgment: Judgment normal.    Data: Abdominal Aorta Findings:  +-----------+-------+----------+----------+--------+--------+--------+  Location   AP (cm)Trans (cm)PSV (cm/s)WaveformThrombusComments  +-----------+-------+----------+----------+--------+--------+--------+  Proximal   1.70             113                                 +-----------+-------+----------+----------+--------+--------+--------+  Mid        3.03   2.70      145                                 +-----------+-------+----------+----------+--------+--------+--------+  Distal     4.59   4.83      59                                  +-----------+-------+----------+----------+--------+--------+--------+  RT CIA Prox1.5    1.6       90                                  +-----------+-------+----------+----------+--------+--------+--------+  LT CIA Prox1.4    1.3       73                                  +-----------+-------+----------+----------+--------+--------+--------+        Assessment/Plan:     86 year old male with slowly enlarging abdominal arctic aneurysm noted to be 4.4 cm in 2020 and now 4.8 cm by today's duplex.  I have offered follow-up in 6 months versus 1 year versus no further follow-up with patient would not ever desire surgery.  While he states he does not want to ever have surgery his wife is adamant that  he does have this followed in the future.  We have settled on follow-up in 1 year with repeat duplex.  I discussed with them that while he does have low risk of rupture it is not 0 risk of rupture and that any severe abdominal or back  pain should be evaluated for issue with his aneurysm.  We have discussed repairing his aneurysm at 5.5 cm should not reach that size and obtaining CT scan if it does approach that size.  Patient and his wife demonstrate very good understanding.     Maeola Harman MD [facility]

## 2021-06-28 ENCOUNTER — Ambulatory Visit (INDEPENDENT_AMBULATORY_CARE_PROVIDER_SITE_OTHER): Payer: Medicare Other

## 2021-06-28 DIAGNOSIS — R55 Syncope and collapse: Secondary | ICD-10-CM | POA: Diagnosis not present

## 2021-06-29 LAB — CUP PACEART REMOTE DEVICE CHECK
Date Time Interrogation Session: 20221128082755
Implantable Pulse Generator Implant Date: 20211027

## 2021-07-06 NOTE — Progress Notes (Signed)
Carelink Summary Report / Loop Recorder 

## 2021-07-27 DIAGNOSIS — Z9889 Other specified postprocedural states: Secondary | ICD-10-CM | POA: Insufficient documentation

## 2021-07-27 DIAGNOSIS — Z8673 Personal history of transient ischemic attack (TIA), and cerebral infarction without residual deficits: Secondary | ICD-10-CM | POA: Insufficient documentation

## 2021-07-29 ENCOUNTER — Ambulatory Visit (INDEPENDENT_AMBULATORY_CARE_PROVIDER_SITE_OTHER): Payer: Medicare Other | Admitting: Internal Medicine

## 2021-07-29 ENCOUNTER — Other Ambulatory Visit: Payer: Self-pay

## 2021-07-29 ENCOUNTER — Encounter: Payer: Self-pay | Admitting: Internal Medicine

## 2021-07-29 ENCOUNTER — Ambulatory Visit: Payer: Medicare Other | Admitting: Internal Medicine

## 2021-07-29 DIAGNOSIS — Z8673 Personal history of transient ischemic attack (TIA), and cerebral infarction without residual deficits: Secondary | ICD-10-CM | POA: Diagnosis not present

## 2021-07-29 DIAGNOSIS — Z9889 Other specified postprocedural states: Secondary | ICD-10-CM

## 2021-07-29 NOTE — Patient Instructions (Signed)
Medication Instructions:  Your physician recommends that you continue on your current medications as directed. Please refer to the Current Medication list given to you today.  *If you need a refill on your cardiac medications before your next appointment, please call your pharmacy*   Lab Work: None ordered.  If you have labs (blood work) drawn today and your tests are completely normal, you will receive your results only by: . MyChart Message (if you have MyChart) OR . A paper copy in the mail If you have any lab test that is abnormal or we need to change your treatment, we will call you to review the results.   Testing/Procedures: None ordered.    Follow-Up: At [facility], you and your health needs are our priority.  As part of our continuing mission to provide you with exceptional heart care, we have created designated Provider Care Teams.  These Care Teams include your primary Cardiologist (physician) and Advanced Practice Providers (APPs -  Physician Assistants and Nurse Practitioners) who all work together to provide you with the care you need, when you need it.  We recommend signing up for the patient portal called "MyChart".  Sign up information is provided on this After Visit Summary.  MyChart is used to connect with patients for Virtual Visits (Telemedicine).  Patients are able to view lab/test results, encounter notes, upcoming appointments, etc.  Non-urgent messages can be sent to your provider as well.   To learn more about what you can do with MyChart, go to [email or web address].    Your next appointment:   6 month(s)  The format for your next appointment:   In Person  Provider:   Dr Klein    

## 2021-07-29 NOTE — Progress Notes (Signed)
Patient Care Team: Lacinda Axon as PCP - General (Skilled Nursing Facility) Nahser, Deloris Ping, MD as PCP - Cardiology (Cardiology) Van Clines, MD as Consulting Physician (Neurology)   HPI  Steven Kent is a 86 y.o. male seen today in followup for spells, the cause of which is unclear. Seizure like activity, s/p loop recorder insertion.  Neuro eval >> 72 hr EEG and standard EEG --normal;  Brain MRI >> chronic infarct and angiopathy ( vascular v amyloid) but no specific seizure diagnosis   He remains wheel chair bound  Reviewed spells-- abrupt loss of responsiveness; occ assoc with some seizure like activity but interestingly per wife EYES ARE CLOSED; duration is many minutes Also falls asleep and suddenly.  The spells, in his wifes mind, are distinct from sleep ni that in the latter, he responds to voice and shaking.  Here for loop recorder insertion   Records and Results Reviewed     Today,   He is accompanied by his wife. His wife reports that he experienced syncope onsetting 4-6 weeks ago, and he was unrespnsive for 4-5 minutes.  His eyes were closed the last time he passed out.  His wife is unable to take care of him all the time. She adds that he has progressivly become weaker.   He used to be 260 lbs, but now he is around 180 lbs.  His wife reports that he only sees two doctors that he sees outside of [facility].   He denies chest pain, shortness of breath, nocturnal dyspnea, orthopnea or peripheral edema.  There have been no palpitations.  Complains of syncope.    DATE TEST EF   1/21  Echo 55-60% Moderately increased LVH    11/22 AAA Duplex    Abnormal dilation of the distal abdominal aorta            DATE Cr K Hgb  7/22  1.1 4.3 12.0            Past Medical History:  Diagnosis Date   Abdominal aortic aneurysm (AAA)    Hypertension    Stroke [facility])     Past Surgical History:  Procedure Laterality Date   CATARACT EXTRACTION, BILATERAL      rt eye 10/09/2014 lt eye 11/06/2014   DENTAL SURGERY     REFRACTIVE SURGERY  01/2015    Current Meds  Medication Sig   acetaminophen (TYLENOL) 650 MG CR tablet Take 1,300 mg by mouth every 8 (eight) hours as needed for pain.   Acetaminophen-Codeine 300-30 MG tablet Take 1 tablet by mouth daily as needed.   atorvastatin (LIPITOR) 40 MG tablet TAKE 1 TABLET(40 MG) BY MOUTH EVERY EVENING (Patient taking differently: Take 40 mg by mouth every evening.)   brimonidine (ALPHAGAN) 0.15 % ophthalmic solution Place 1 drop into both eyes daily.   dorzolamide (TRUSOPT) 2 % ophthalmic solution Place 1 drop into both eyes 2 (two) times daily.   latanoprost (XALATAN) 0.005 % ophthalmic solution Place 1 drop into both eyes at bedtime.   oxybutynin (DITROPAN) 5 MG tablet TAKE 1 TABLET(5 MG) BY MOUTH TWICE DAILY (Patient taking differently: Take 5 mg by mouth 2 (two) times daily.)   oxybutynin (DITROPAN-XL) 5 MG 24 hr tablet Take 5 mg by mouth daily.   torsemide (DEMADEX) 20 MG tablet Take 20 mg by mouth daily.   Vitamin D, Ergocalciferol, (DRISDOL) 1.25 MG (50000 UNIT) CAPS capsule TAKE 1 CAPSULE BY MOUTH EVERY 7 DAYS   XARELTO  20 MG TABS tablet TAKE 1 TABLET(20 MG) BY MOUTH EVERY EVENING (Patient taking differently: Take 20 mg by mouth daily with supper.)   [DISCONTINUED] gabapentin (NEURONTIN) 300 MG capsule Take 300 mg by mouth in the morning.   [DISCONTINUED] gabapentin (NEURONTIN) 600 MG tablet Take 600 mg by mouth at bedtime.    Allergies  Allergen Reactions   Aspirin Other (See Comments)    Full Strength Asprin-325 mg causes upset stomach. Can take 81 mg.      Review of Systems negative except from HPI and PMH Please see the history of present illness. (+) Syncope (+) Weakness All other systems are reviewed and negative.     Physical Exam BP 110/60    Pulse (!) 59    Ht 6' 1.5" (1.867 m)    Wt 180 lb (81.6 kg)    SpO2 99%    BMI 23.43 kg/m  Well developed and nourished in no acute  distress HENT normal weaing eye patch over R eye  Neck supple  Clear Regular rate and rhythm, no murmurs or gallops Abd-soft with active BS No Clubbing cyanosis edema Skin-warm and dry A & Oriented  R hemiparesis and contracture    07/29/2021 ECG: Sinus @ 59 28/10/44 PVC  CrCl cannot be calculated (Patient's most recent lab result is older than the maximum 21 days allowed.).   Assessment and  Plan  Stroke w residual HP   Spells   Bradycardia  Atrial fibrillation-paroxysmal   VTE   Hypertension  Blood pressure well controlled.  Recurrent syncope/spells.  No arrhythmia noted.  As outlined previously, closed eyes are not seen with cardiovascular syncope.  Raising the possibility of whether this is a neurological issue or a pseudoseizure.  Atrial fibrillation.  On Xarelto.  20 mg.  GFR based on 7/22 labs is over 50     Current medicines are reviewed at length with the patient today .  The patient does not   have concerns regarding medicines.  F/U in 6 months   I,Tinashe Williams,acting as a scribe for Sherryl Manges, MD.,have documented all relevant documentation on the behalf of Sherryl Manges, MD,as directed by  Sherryl Manges, MD while in the presence of Sherryl Manges, MD.   I, Sherryl Manges, MD, have reviewed all documentation for this visit. The documentation on 07/29/21 for the exam, diagnosis, procedures, and orders are all accurate and complete.

## 2021-08-01 LAB — CUP PACEART REMOTE DEVICE CHECK
Date Time Interrogation Session: 20221231082729
Implantable Pulse Generator Implant Date: 20211027

## 2021-09-06 ENCOUNTER — Ambulatory Visit (INDEPENDENT_AMBULATORY_CARE_PROVIDER_SITE_OTHER): Payer: Medicare Other

## 2021-09-06 DIAGNOSIS — R55 Syncope and collapse: Secondary | ICD-10-CM | POA: Diagnosis not present

## 2021-09-07 LAB — CUP PACEART REMOTE DEVICE CHECK
Date Time Interrogation Session: 20230205231337
Implantable Pulse Generator Implant Date: 20211027

## 2021-09-09 NOTE — Progress Notes (Signed)
Carelink Summary Report / Loop Recorder 

## 2021-10-11 ENCOUNTER — Ambulatory Visit (INDEPENDENT_AMBULATORY_CARE_PROVIDER_SITE_OTHER): Payer: Medicare Other

## 2021-10-11 DIAGNOSIS — R55 Syncope and collapse: Secondary | ICD-10-CM

## 2021-10-12 LAB — CUP PACEART REMOTE DEVICE CHECK
Date Time Interrogation Session: 20230312230712
Implantable Pulse Generator Implant Date: 20211027

## 2021-10-25 NOTE — Progress Notes (Signed)
Carelink Summary Report / Loop Recorder 

## 2021-11-15 ENCOUNTER — Ambulatory Visit (INDEPENDENT_AMBULATORY_CARE_PROVIDER_SITE_OTHER): Payer: Medicare Other

## 2021-11-15 DIAGNOSIS — R55 Syncope and collapse: Secondary | ICD-10-CM | POA: Diagnosis not present

## 2021-11-16 LAB — CUP PACEART REMOTE DEVICE CHECK
Date Time Interrogation Session: 20230414230855
Implantable Pulse Generator Implant Date: 20211027

## 2021-12-02 NOTE — Progress Notes (Signed)
Carelink Summary Report / Loop Recorder 

## 2021-12-19 LAB — CUP PACEART REMOTE DEVICE CHECK
Date Time Interrogation Session: 20230517231113
Implantable Pulse Generator Implant Date: 20211027

## 2021-12-20 ENCOUNTER — Ambulatory Visit (INDEPENDENT_AMBULATORY_CARE_PROVIDER_SITE_OTHER): Payer: Medicare Other

## 2021-12-20 DIAGNOSIS — R55 Syncope and collapse: Secondary | ICD-10-CM

## 2022-01-05 NOTE — Progress Notes (Signed)
Carelink Summary Report / Loop Recorder 

## 2022-01-24 ENCOUNTER — Ambulatory Visit (INDEPENDENT_AMBULATORY_CARE_PROVIDER_SITE_OTHER): Payer: Medicare Other

## 2022-01-24 DIAGNOSIS — R55 Syncope and collapse: Secondary | ICD-10-CM

## 2022-01-25 LAB — CUP PACEART REMOTE DEVICE CHECK
Date Time Interrogation Session: 20230619231047
Implantable Pulse Generator Implant Date: 20211027

## 2022-02-18 NOTE — Progress Notes (Signed)
Carelink Summary Report / Loop Recorder 

## 2022-02-24 LAB — CUP PACEART REMOTE DEVICE CHECK
Date Time Interrogation Session: 20230722230416
Implantable Pulse Generator Implant Date: 20211027

## 2022-02-28 ENCOUNTER — Ambulatory Visit (INDEPENDENT_AMBULATORY_CARE_PROVIDER_SITE_OTHER): Payer: Medicare Other

## 2022-02-28 DIAGNOSIS — R55 Syncope and collapse: Secondary | ICD-10-CM | POA: Diagnosis not present

## 2022-04-03 NOTE — Progress Notes (Signed)
Carelink Summary Report / Loop Recorder 

## 2022-04-04 LAB — CUP PACEART REMOTE DEVICE CHECK
Date Time Interrogation Session: 20230830231248
Implantable Pulse Generator Implant Date: 20211027

## 2022-04-05 ENCOUNTER — Ambulatory Visit (INDEPENDENT_AMBULATORY_CARE_PROVIDER_SITE_OTHER): Payer: Medicare Other

## 2022-04-05 DIAGNOSIS — R55 Syncope and collapse: Secondary | ICD-10-CM

## 2022-04-28 NOTE — Progress Notes (Signed)
Carelink Summary Report / Loop Recorder 

## 2022-05-04 LAB — CUP PACEART REMOTE DEVICE CHECK
Date Time Interrogation Session: 20231003111057
Implantable Pulse Generator Implant Date: 20211027

## 2022-05-09 ENCOUNTER — Ambulatory Visit (INDEPENDENT_AMBULATORY_CARE_PROVIDER_SITE_OTHER): Payer: Medicare Other

## 2022-05-09 DIAGNOSIS — R55 Syncope and collapse: Secondary | ICD-10-CM | POA: Diagnosis not present

## 2022-05-09 DIAGNOSIS — Z8673 Personal history of transient ischemic attack (TIA), and cerebral infarction without residual deficits: Secondary | ICD-10-CM

## 2022-05-18 NOTE — Progress Notes (Signed)
Carelink Summary Report / Loop Recorder 

## 2022-06-09 LAB — CUP PACEART REMOTE DEVICE CHECK
Date Time Interrogation Session: 20231104230620
Implantable Pulse Generator Implant Date: 20211027

## 2022-06-13 ENCOUNTER — Ambulatory Visit (INDEPENDENT_AMBULATORY_CARE_PROVIDER_SITE_OTHER): Payer: Medicare Other

## 2022-06-13 DIAGNOSIS — R55 Syncope and collapse: Secondary | ICD-10-CM | POA: Diagnosis not present

## 2022-07-18 ENCOUNTER — Ambulatory Visit (INDEPENDENT_AMBULATORY_CARE_PROVIDER_SITE_OTHER): Payer: Medicare Other

## 2022-07-18 DIAGNOSIS — R55 Syncope and collapse: Secondary | ICD-10-CM

## 2022-07-19 LAB — CUP PACEART REMOTE DEVICE CHECK
Date Time Interrogation Session: 20231217231739
Implantable Pulse Generator Implant Date: 20211027

## 2022-07-28 NOTE — Progress Notes (Signed)
Carelink Summary Report / Loop Recorder 

## 2022-08-02 ENCOUNTER — Encounter (HOSPITAL_COMMUNITY): Payer: Self-pay | Admitting: Emergency Medicine

## 2022-08-02 ENCOUNTER — Inpatient Hospital Stay (HOSPITAL_COMMUNITY)
Admission: EM | Admit: 2022-08-02 | Discharge: 2022-08-04 | DRG: 177 | Disposition: A | Discharge status: Skilled Nursing Facility | Payer: Medicare Other | Source: Skilled Nursing Facility | Attending: Internal Medicine | Admitting: Internal Medicine

## 2022-08-02 ENCOUNTER — Emergency Department (HOSPITAL_COMMUNITY): Payer: Medicare Other

## 2022-08-02 DIAGNOSIS — J1282 Pneumonia due to coronavirus disease 2019: Secondary | ICD-10-CM | POA: Diagnosis present

## 2022-08-02 DIAGNOSIS — J189 Pneumonia, unspecified organism: Principal | ICD-10-CM

## 2022-08-02 DIAGNOSIS — J111 Influenza due to unidentified influenza virus with other respiratory manifestations: Secondary | ICD-10-CM

## 2022-08-02 DIAGNOSIS — I69351 Hemiplegia and hemiparesis following cerebral infarction affecting right dominant side: Secondary | ICD-10-CM

## 2022-08-02 DIAGNOSIS — J101 Influenza due to other identified influenza virus with other respiratory manifestations: Secondary | ICD-10-CM | POA: Diagnosis present

## 2022-08-02 DIAGNOSIS — R4189 Other symptoms and signs involving cognitive functions and awareness: Secondary | ICD-10-CM | POA: Diagnosis not present

## 2022-08-02 DIAGNOSIS — R5381 Other malaise: Secondary | ICD-10-CM | POA: Diagnosis present

## 2022-08-02 DIAGNOSIS — N179 Acute kidney failure, unspecified: Secondary | ICD-10-CM

## 2022-08-02 DIAGNOSIS — I10 Essential (primary) hypertension: Secondary | ICD-10-CM | POA: Diagnosis present

## 2022-08-02 DIAGNOSIS — Z79899 Other long term (current) drug therapy: Secondary | ICD-10-CM

## 2022-08-02 DIAGNOSIS — U071 COVID-19: Principal | ICD-10-CM | POA: Diagnosis present

## 2022-08-02 DIAGNOSIS — J1008 Influenza due to other identified influenza virus with other specified pneumonia: Secondary | ICD-10-CM | POA: Diagnosis present

## 2022-08-02 DIAGNOSIS — E876 Hypokalemia: Secondary | ICD-10-CM | POA: Diagnosis present

## 2022-08-02 DIAGNOSIS — Z87891 Personal history of nicotine dependence: Secondary | ICD-10-CM

## 2022-08-02 DIAGNOSIS — E785 Hyperlipidemia, unspecified: Secondary | ICD-10-CM | POA: Diagnosis present

## 2022-08-02 DIAGNOSIS — I480 Paroxysmal atrial fibrillation: Secondary | ICD-10-CM

## 2022-08-02 DIAGNOSIS — Z7901 Long term (current) use of anticoagulants: Secondary | ICD-10-CM

## 2022-08-02 DIAGNOSIS — Z7401 Bed confinement status: Secondary | ICD-10-CM

## 2022-08-02 DIAGNOSIS — Z886 Allergy status to analgesic agent status: Secondary | ICD-10-CM

## 2022-08-02 LAB — CBC WITH DIFFERENTIAL/PLATELET
Abs Immature Granulocytes: 0.02 10*3/uL (ref 0.00–0.07)
Basophils Absolute: 0 10*3/uL (ref 0.0–0.1)
Basophils Relative: 0 %
Eosinophils Absolute: 0.1 10*3/uL (ref 0.0–0.5)
Eosinophils Relative: 2 %
HCT: 44 % (ref 39.0–52.0)
Hemoglobin: 13.7 g/dL (ref 13.0–17.0)
Immature Granulocytes: 0 %
Lymphocytes Relative: 17 %
Lymphs Abs: 1.1 10*3/uL (ref 0.7–4.0)
MCH: 30.4 pg (ref 26.0–34.0)
MCHC: 31.1 g/dL (ref 30.0–36.0)
MCV: 97.8 fL (ref 80.0–100.0)
Monocytes Absolute: 0.5 10*3/uL (ref 0.1–1.0)
Monocytes Relative: 8 %
Neutro Abs: 4.4 10*3/uL (ref 1.7–7.7)
Neutrophils Relative %: 73 %
Platelets: 159 10*3/uL (ref 150–400)
RBC: 4.5 MIL/uL (ref 4.22–5.81)
RDW: 15.9 % — ABNORMAL HIGH (ref 11.5–15.5)
WBC: 6.1 10*3/uL (ref 4.0–10.5)
nRBC: 0 % (ref 0.0–0.2)

## 2022-08-02 LAB — APTT: aPTT: 32 seconds (ref 24–36)

## 2022-08-02 LAB — COMPREHENSIVE METABOLIC PANEL
ALT: 18 U/L (ref 0–44)
AST: 28 U/L (ref 15–41)
Albumin: 4 g/dL (ref 3.5–5.0)
Alkaline Phosphatase: 53 U/L (ref 38–126)
Anion gap: 12 (ref 5–15)
BUN: 32 mg/dL — ABNORMAL HIGH (ref 8–23)
CO2: 28 mmol/L (ref 22–32)
Calcium: 8.8 mg/dL — ABNORMAL LOW (ref 8.9–10.3)
Chloride: 98 mmol/L (ref 98–111)
Creatinine, Ser: 1.42 mg/dL — ABNORMAL HIGH (ref 0.61–1.24)
GFR, Estimated: 48 mL/min — ABNORMAL LOW (ref >60)
Glucose, Bld: 111 mg/dL — ABNORMAL HIGH (ref 70–99)
Potassium: 3.4 mmol/L — ABNORMAL LOW (ref 3.5–5.1)
Sodium: 138 mmol/L (ref 135–145)
Total Bilirubin: 1.5 mg/dL — ABNORMAL HIGH (ref 0.3–1.2)
Total Protein: 8.4 g/dL — ABNORMAL HIGH (ref 6.5–8.1)

## 2022-08-02 LAB — LACTIC ACID, PLASMA
Lactic Acid, Venous: 1.6 mmol/L (ref 0.5–1.9)
Lactic Acid, Venous: 1.2 mmol/L (ref 0.5–1.9)

## 2022-08-02 LAB — PROCALCITONIN: Procalcitonin: 1.1 ng/mL

## 2022-08-02 LAB — RESP PANEL BY RT-PCR (RSV, FLU A&B, COVID)  RVPGX2
Influenza A by PCR: POSITIVE — AB
Influenza B by PCR: NEGATIVE
Resp Syncytial Virus by PCR: NEGATIVE
SARS Coronavirus 2 by RT PCR: POSITIVE — AB

## 2022-08-02 LAB — PROTIME-INR
INR: 1.6 — ABNORMAL HIGH (ref 0.8–1.2)
Prothrombin Time: 19.3 seconds — ABNORMAL HIGH (ref 11.4–15.2)

## 2022-08-02 MED ORDER — OSELTAMIVIR PHOSPHATE 30 MG PO CAPS
30.0000 mg | ORAL_CAPSULE | Freq: Two times a day (BID) | ORAL | Status: DC
  Administered 2022-08-02 – 2022-08-03 (×2): 30 mg via ORAL
  Filled 2022-08-02 (×2): qty 1

## 2022-08-02 MED ORDER — LACTATED RINGERS IV BOLUS (SEPSIS)
1000.0000 mL | Freq: Once | INTRAVENOUS | Status: AC
  Administered 2022-08-02: 1000 mL via INTRAVENOUS

## 2022-08-02 MED ORDER — SODIUM CHLORIDE 0.9 % IV SOLN
100.0000 mg | Freq: Every day | INTRAVENOUS | Status: DC
  Administered 2022-08-03: 100 mg via INTRAVENOUS
  Filled 2022-08-02 (×2): qty 20

## 2022-08-02 MED ORDER — SODIUM CHLORIDE 0.9 % IV SOLN
200.0000 mg | Freq: Once | INTRAVENOUS | Status: AC
  Administered 2022-08-02: 200 mg via INTRAVENOUS
  Filled 2022-08-02: qty 40

## 2022-08-02 MED ORDER — SODIUM CHLORIDE 0.9 % IV SOLN: INTRAVENOUS | Status: AC

## 2022-08-02 MED ORDER — ACETAMINOPHEN 325 MG PO TABS
650.0000 mg | ORAL_TABLET | Freq: Four times a day (QID) | ORAL | Status: DC | PRN
  Administered 2022-08-02: 650 mg via ORAL
  Filled 2022-08-02: qty 2

## 2022-08-02 MED ORDER — SODIUM CHLORIDE 0.9 % IV SOLN
1.0000 g | Freq: Once | INTRAVENOUS | Status: AC
  Administered 2022-08-02: 1 g via INTRAVENOUS
  Filled 2022-08-02: qty 10

## 2022-08-02 MED ORDER — ACETAMINOPHEN 325 MG PO TABS
650.0000 mg | ORAL_TABLET | Freq: Once | ORAL | Status: AC
  Administered 2022-08-02: 650 mg via ORAL
  Filled 2022-08-02: qty 2

## 2022-08-02 MED ORDER — SODIUM CHLORIDE 0.9 % IV SOLN
500.0000 mg | Freq: Once | INTRAVENOUS | Status: AC
  Administered 2022-08-02: 500 mg via INTRAVENOUS
  Filled 2022-08-02: qty 5

## 2022-08-02 MED ORDER — RIVAROXABAN 20 MG PO TABS
20.0000 mg | ORAL_TABLET | Freq: Every day | ORAL | Status: DC
  Administered 2022-08-02 – 2022-08-03 (×2): 20 mg via ORAL
  Filled 2022-08-02 (×2): qty 1

## 2022-08-02 NOTE — ED Provider Notes (Addendum)
[facility]-EMERGENCY DEPT Provider Note   CSN: [id number] Arrival date & time: 08/02/22  1754     History  Chief Complaint  Patient presents with   Cough   Fever    Steven Kent is a 88 y.o. male.   Cough Associated symptoms: fever   Fever Associated symptoms: cough      Patient has history of stroke hypertension abdominal aortic aneurysm.  Patient resides at a nursing facility.  He has been having trouble with cough fever over the last couple of days.  Staff at the facility have also noticed some blood-tinged sputum.  Patient himself has dementia and only answers questions and is not able to provide any history.  He denies any specific complaints.  Home Medications Prior to Admission medications   Medication Sig Start Date End Date Taking? Authorizing Provider  acetaminophen (TYLENOL) 650 MG CR tablet Take 1,300 mg by mouth every 8 (eight) hours as needed for pain.    [provider]  Acetaminophen-Codeine 300-30 MG tablet Take 1 tablet by mouth daily as needed. 05/04/21   [provider]  atorvastatin (LIPITOR) 40 MG tablet TAKE 1 TABLET(40 MG) BY MOUTH EVERY EVENING Patient taking differently: Take 40 mg by mouth every evening. 09/07/20   Ardith Dark, MD  Baclofen 5 MG TABS Take 1 tablet by mouth 2 (two) times daily as needed. 07/15/21   [provider]  brimonidine (ALPHAGAN) 0.15 % ophthalmic solution Place 1 drop into both eyes daily.    [provider]  dorzolamide (TRUSOPT) 2 % ophthalmic solution Place 1 drop into both eyes 2 (two) times daily.    [provider]  gabapentin (NEURONTIN) 400 MG capsule Take 400 mg by mouth daily. 06/23/21   [provider]  latanoprost (XALATAN) 0.005 % ophthalmic solution Place 1 drop into both eyes at bedtime.    [provider]  oxybutynin (DITROPAN) 5 MG tablet TAKE 1 TABLET(5 MG) BY MOUTH TWICE DAILY Patient taking differently: Take 5 mg by  mouth 2 (two) times daily. 11/12/20   Ardith Dark, MD  oxybutynin (DITROPAN-XL) 5 MG 24 hr tablet Take 5 mg by mouth daily. 05/01/21   [provider]  torsemide (DEMADEX) 20 MG tablet Take 20 mg by mouth daily. 05/01/21   [provider]  Vitamin D, Ergocalciferol, (DRISDOL) 1.25 MG (50000 UNIT) CAPS capsule TAKE 1 CAPSULE BY MOUTH EVERY 7 DAYS 12/21/20   Ardith Dark, MD  XARELTO 20 MG TABS tablet TAKE 1 TABLET(20 MG) BY MOUTH EVERY EVENING Patient taking differently: Take 20 mg by mouth daily with supper. 09/07/20   Ardith Dark, MD      Allergies    Aspirin    Review of Systems   Review of Systems  Constitutional:  Positive for fever.  Respiratory:  Positive for cough.     Physical Exam Updated Vital Signs BP 124/61   Pulse 96   Temp (!) 102.3 F (39.1 C) (Oral)   Resp 15   SpO2 90%  Physical Exam Vitals and nursing note reviewed.  Constitutional:      Appearance: He is well-developed. He is not diaphoretic.  HENT:     Head: Normocephalic and atraumatic.     Right Ear: External ear normal.     Left Ear: External ear normal.  Eyes:     General: No scleral icterus.       Right eye: No discharge.  Left eye: No discharge.     Conjunctiva/sclera: Conjunctivae normal.  Neck:     Trachea: No tracheal deviation.  Cardiovascular:     Rate and Rhythm: Normal rate and regular rhythm.  Pulmonary:     Effort: Pulmonary effort is normal. No respiratory distress.     Breath sounds: Normal breath sounds. No stridor. No wheezing or rales.     Comments: Frequent coughing at the bedside, no hemoptysis noted Abdominal:     General: Bowel sounds are normal. There is no distension.     Palpations: Abdomen is soft.     Tenderness: There is no abdominal tenderness. There is no guarding or rebound.  Musculoskeletal:        General: No deformity.     Cervical back: Neck supple.     Right lower leg: No edema.     Left lower leg: No edema.  Skin:     General: Skin is warm and dry.     Findings: No rash.  Neurological:     General: No focal deficit present.     Mental Status: He is alert.     Cranial Nerves: No cranial nerve deficit, dysarthria or facial asymmetry.     Sensory: No sensory deficit.     Motor: No abnormal muscle tone or seizure activity.     Coordination: Coordination normal.  Psychiatric:        Mood and Affect: Mood normal.     ED Results / Procedures / Treatments   Labs (all labs ordered are listed, but only abnormal results are displayed) Labs Reviewed  RESP PANEL BY RT-PCR (RSV, FLU A&B, COVID)  RVPGX2 - Abnormal; Notable for the following components:      Result Value   SARS Coronavirus 2 by RT PCR POSITIVE (*)    Influenza A by PCR POSITIVE (*)    All other components within normal limits  COMPREHENSIVE METABOLIC PANEL - Abnormal; Notable for the following components:   Potassium 3.4 (*)    Glucose, Bld 111 (*)    BUN 32 (*)    Creatinine, Ser 1.42 (*)    Calcium 8.8 (*)    Total Protein 8.4 (*)    Total Bilirubin 1.5 (*)    GFR, Estimated 48 (*)    All other components within normal limits  CBC WITH DIFFERENTIAL/PLATELET - Abnormal; Notable for the following components:   RDW 15.9 (*)    All other components within normal limits  PROTIME-INR - Abnormal; Notable for the following components:   Prothrombin Time 19.3 (*)    INR 1.6 (*)    All other components within normal limits  CULTURE, BLOOD (ROUTINE X 2)  CULTURE, BLOOD (ROUTINE X 2)  LACTIC ACID, PLASMA  APTT  LACTIC ACID, PLASMA  URINALYSIS, ROUTINE W REFLEX MICROSCOPIC  PROCALCITONIN  CBC  BASIC METABOLIC PANEL    EKG None  Radiology DG Chest Port 1 View  Result Date: 08/02/2022 CLINICAL DATA:  Hemoptysis. EXAM: PORTABLE CHEST 1 VIEW COMPARISON:  Dec 10, 2020 FINDINGS: The cardiac silhouette is mildly enlarged and unchanged in size. A radiopaque loop recorder device is seen. Low lung volumes are noted. Mild infiltrate is noted  within the left lung base. There is no evidence of a pleural effusion or pneumothorax. Multilevel degenerative changes are seen throughout the thoracic spine. IMPRESSION: Low lung volumes with mild left basilar infiltrate. Electronically Signed   By: Virgina Norfolk M.D.   On: 08/02/2022 19:52    Procedures Procedures  Medications Ordered in ED Medications  0.9 %  sodium chloride infusion ( Intravenous New Bag/Given 08/02/22 1920)  azithromycin (ZITHROMAX) 500 mg in sodium chloride 0.9 % 250 mL IVPB (has no administration in time range)  acetaminophen (TYLENOL) tablet 650 mg (has no administration in time range)  oseltamivir (TAMIFLU) capsule 75 mg (has no administration in time range)  lactated ringers bolus 1,000 mL (1,000 mLs Intravenous New Bag/Given 08/02/22 1846)  cefTRIAXone (ROCEPHIN) 1 g in sodium chloride 0.9 % 100 mL IVPB (1 g Intravenous New Bag/Given 08/02/22 1920)  acetaminophen (TYLENOL) tablet 650 mg (650 mg Oral Given 08/02/22 1859)    ED Course/ Medical Decision Making/ A&P Clinical Course as of 08/02/22 2029  Tue Aug 02, 2022  1959 DG Chest [place] 1 View Chest x-ray shows low lung volumes and left basilar infiltrate [JK]  1959 CBC with Differential(!) CBC normal [JK]  1959 Lactic acid, plasma Lactic acid level normal [JK]  1959 Comprehensive metabolic panel(!) Elevated creatinine.  Mild hypokalemia [JK]  2005 Repeat blood pressure 145/74 [JK]  2024 Case discussed with Dr Flossie Buffy regarding admission [JK]    Clinical Course User Index [JK] Dorie Rank, MD                           Medical Decision Making Patient notably febrile in the ED.  Differential diagnosis includes but not limited to, sepsis, pneumonia, COVID, influenza, UTI  Also noted to be hypertensive.  Will recheck  Problems Addressed: Community acquired pneumonia, unspecified laterality: acute illness or injury that poses a threat to life or bodily functions  Amount and/or Complexity of Data Reviewed Labs:  ordered. Decision-making details documented in ED Course. Radiology: ordered and independent interpretation performed. Decision-making details documented in ED Course. ECG/medicine tests: ordered.  Risk OTC drugs. Prescription drug management. Decision regarding hospitalization.   Patient presented to the ER for evaluation of cough fever with blood-tinged sputum.  Patient noted to have frequent coughing in the ED but no signs of hemoptysis during my evaluation.  Patient noted to be febrile but no signs of hypotension or lactic acidosis to suggest evolving sepsis at this time.  Will certainly continue to monitor.  Patient noted to have Monia as well as positive for influenza and COVID.  Patient also noted to have AKI with an elevated creatinine.  I have started the patient IV antibiotics and IV fluids.  With his age and comorbidities I will consult the medical service for admission.        Final Clinical Impression(s) / ED Diagnoses Final diagnoses:  Community acquired pneumonia, unspecified laterality  COVID-19 virus infection  Influenza    Rx / DC Orders ED Discharge Orders     None         Dorie Rank, MD 08/02/22 2017    Dorie Rank, MD 08/02/22 2030

## 2022-08-02 NOTE — Assessment & Plan Note (Signed)
-  Alert and oriented to self. Wife reports that at times this is his baseline.

## 2022-08-02 NOTE — Assessment & Plan Note (Addendum)
-  Tamiflu seen on medical list from SNF on 07/28/2022 but unclear days he has been administered the medication. Wife always mentioned that he usually refuses treatment for flu so not sure if he would have taken them. She is agreeable to him getting treated inpatient.

## 2022-08-02 NOTE — ED Triage Notes (Signed)
Pt here from  [facility] with c/o coughing up  blood and fever , had a negative chest z ray and neg flu test last week , feels warm to touch on arrival to the ED

## 2022-08-02 NOTE — Assessment & Plan Note (Signed)
-   Continue Xarelto and statin.

## 2022-08-02 NOTE — Assessment & Plan Note (Signed)
-   Initially presented with elevated BP but now normotensive.

## 2022-08-02 NOTE — Assessment & Plan Note (Addendum)
-  COVID + with left sided infiltrate seen on CXR. Remains stable on room air. -not able to start Paxlovid due to interaction with anticoagulation -will start remdesivir -Procalcitonin is pending to assess for possible superimposed pneumonia with infiltrate seen on chest x-ray.  However no leukocytosis.

## 2022-08-02 NOTE — Assessment & Plan Note (Signed)
-  elevated creatinine of 1.42 with remote prior of 1 -keep on continuous IV fluid and follow repeat BMP in the morning

## 2022-08-02 NOTE — H&P (Addendum)
History and Physical    Patient: Steven Kent CBJ:[id number] DOB: [birth year] DOA: 08/02/2022 DOS: the patient was seen and examined on 08/02/2022 PCP: Eddie North  Patient coming from: SNF  Chief Complaint:  Chief Complaint  Patient presents with   Cough   Fever   HPI: Steven Kent is a 88 y.o. male with medical history significant of CVA with residual right-sided weakness, VTE, paroxysmal atrial fibrillation on Xarelto, hypertension, hyperlipidemia, AAA who presents from SNF with cough.  Patient alert and oriented only to self. Only tells me he has been having productive cough but no other history. Based on SNF medication list, he was prescribed Tamiflu on 07/28/2022. Discussed with wife over the phone and she last talk to him 4 days ago and at that time he already developed a cough. Facility never mentioned to her if he had the flu but says that recently they isolated him numerous times for COVID.Says he normally doesn't like to take flu medicine but she is agreeable to him getting treatment inpatient.   In the ED, he was febrile to 102.22F, initially hypertensive with BP of 192/107, heart rate of 104.  Stable on room air.  He was found to be COVID and influenza A positive.  WBC of 6.1, hemoglobin of 13.7.  Lactate of 1.6.  Creatinine is elevated at 1.42 with a remote prior of 1.1.  Chest x-ray on my review showing some increased left sided opacity.  He was started on IV Rocephin and azithromycin in the ED.  Hospitalist then consulted for admission. Review of Systems: unable to review all systems due to the inability of the patient to answer questions. Past Medical History:  Diagnosis Date   Abdominal aortic aneurysm (AAA) ([place])    Hypertension    Stroke [facility])    Past Surgical History:  Procedure Laterality Date   CATARACT EXTRACTION, BILATERAL     rt eye 10/09/2014 lt eye 11/06/2014   DENTAL SURGERY     REFRACTIVE SURGERY  01/2015   Social History:  reports that he  has quit smoking. He has never used smokeless tobacco. He reports that he does not drink alcohol and does not use drugs.  Allergies  Allergen Reactions   Aspirin Other (See Comments)    Full Strength Asprin-325 mg causes upset stomach. Can take 81 mg.    Family History  Problem Relation Age of Onset   Prostate cancer Neg Hx     Prior to Admission medications   Medication Sig Start Date End Date Taking? Authorizing Provider  acetaminophen (TYLENOL) 650 MG CR tablet Take 1,300 mg by mouth every 8 (eight) hours as needed for pain.    [provider]  Acetaminophen-Codeine 300-30 MG tablet Take 1 tablet by mouth daily as needed. 05/04/21   [provider]  atorvastatin (LIPITOR) 40 MG tablet TAKE 1 TABLET(40 MG) BY MOUTH EVERY EVENING Patient taking differently: Take 40 mg by mouth every evening. 09/07/20   Vivi Barrack, MD  Baclofen 5 MG TABS Take 1 tablet by mouth 2 (two) times daily as needed. 07/15/21   [provider]  brimonidine (ALPHAGAN) 0.15 % ophthalmic solution Place 1 drop into both eyes daily.    [provider]  dorzolamide (TRUSOPT) 2 % ophthalmic solution Place 1 drop into both eyes 2 (two) times daily.    [provider]  gabapentin (NEURONTIN) 400 MG capsule Take 400 mg by mouth daily. 06/23/21   [provider]  latanoprost (XALATAN) 0.005 % ophthalmic  solution Place 1 drop into both eyes at bedtime.    [provider]  oxybutynin (DITROPAN) 5 MG tablet TAKE 1 TABLET(5 MG) BY MOUTH TWICE DAILY Patient taking differently: Take 5 mg by mouth 2 (two) times daily. 11/12/20   Ardith Dark, MD  oxybutynin (DITROPAN-XL) 5 MG 24 hr tablet Take 5 mg by mouth daily. 05/01/21   [provider]  torsemide (DEMADEX) 20 MG tablet Take 20 mg by mouth daily. 05/01/21   [provider]  Vitamin D, Ergocalciferol, (DRISDOL) 1.25 MG (50000 UNIT) CAPS capsule TAKE 1 CAPSULE BY MOUTH EVERY 7 DAYS 12/21/20   Ardith Dark, MD  XARELTO 20 MG TABS tablet TAKE 1 TABLET(20 MG) BY MOUTH EVERY EVENING Patient taking differently: Take 20 mg by mouth daily with supper. 09/07/20   Ardith Dark, MD    Physical Exam: Vitals:   08/02/22 1820 08/02/22 1830 08/02/22 1900 08/02/22 2000  BP: (!) 192/107 (!) 151/86 (!) 145/74 124/61  Pulse: (!) 103  (!) 104 96  Resp: 20 20 14 15   Temp: (!) 102.3 F (39.1 C)     TempSrc: Oral     SpO2: 90%  90% 90%   Constitutional: NAD, calm, comfortable, nontoxic appearing elderly gentleman laying flat in bed Eyes: lids and conjunctivae normal ENMT: Mucous membranes are moist. Neck: normal, supple Respiratory: clear to auscultation bilaterally, no wheezing, no crackles. Normal respiratory effort. No accessory muscle use. 92% on room air.  Cardiovascular: Regular rate and rhythm, no murmurs / rubs / gallops. No extremity edema. Abdomen: no tenderness,  Bowel sounds positive.  Musculoskeletal: no clubbing / cyanosis. No joint deformity upper and lower extremities. Good ROM, no contractures. Normal muscle tone.  Skin: no rashes, lesions, ulcers. No induration Neurologic: CN 2-12 grossly intact. Strength 5/5 in all 4.  Psychiatric: Alert and oriented only to self. Normal mood. Data Reviewed:  See HPI  Assessment and Plan: * Influenza A -Tamiflu seen on medical list from SNF on 07/28/2022 but unclear days he has been administered the medication. Wife always mentioned that he usually refuses treatment for flu so not sure if he would have taken them. She is agreeable to him getting treated inpatient.   COVID-19 virus infection -COVID + with left sided infiltrate seen on CXR. Remains stable on room air. -not able to start Paxlovid due to interaction with anticoagulation -will start remdesivir -Procalcitonin is pending to assess for possible superimposed pneumonia with infiltrate seen on chest x-ray.  However no leukocytosis.  AKI (acute kidney injury) (HCC) -elevated  creatinine of 1.42 with remote prior of 1 -keep on continuous IV fluid and follow repeat BMP in the morning  Cognitive impairment -Alert and oriented to self. Wife reports that at times this is his baseline.  Paroxysmal atrial fibrillation (HCC) -rate controlled -continue Xarelto  Essential hypertension - Initially presented with elevated BP but now normotensive.  Hemiparesis affecting right side as late effect of cerebrovascular accident (HCC) - Continue Xarelto and statin.      Advance Care Planning:   Code Status: Full Code   Consults: none  Family Communication: wife over the phone  Severity of Illness: The appropriate patient status for this patient is OBSERVATION. Observation status is judged to be reasonable and necessary in order to provide the required intensity of service to ensure the patient's safety. The patient's presenting symptoms, physical exam findings, and initial radiographic and laboratory data in the context of their medical condition is felt to place them  at decreased risk for further clinical deterioration. Furthermore, it is anticipated that the patient will be medically stable for discharge from the hospital within 2 midnights of admission.   Author: Orene Desanctis, DO 08/02/2022 10:18 PM  For on call review [email or web address].

## 2022-08-02 NOTE — Assessment & Plan Note (Signed)
rate controlled; continue Xarelto

## 2022-08-03 ENCOUNTER — Encounter (HOSPITAL_COMMUNITY): Payer: Self-pay | Admitting: Internal Medicine

## 2022-08-03 DIAGNOSIS — R5381 Other malaise: Secondary | ICD-10-CM | POA: Diagnosis present

## 2022-08-03 DIAGNOSIS — Z7901 Long term (current) use of anticoagulants: Secondary | ICD-10-CM | POA: Diagnosis not present

## 2022-08-03 DIAGNOSIS — J1282 Pneumonia due to coronavirus disease 2019: Secondary | ICD-10-CM | POA: Diagnosis present

## 2022-08-03 DIAGNOSIS — U071 COVID-19: Secondary | ICD-10-CM | POA: Diagnosis present

## 2022-08-03 DIAGNOSIS — Z79899 Other long term (current) drug therapy: Secondary | ICD-10-CM | POA: Diagnosis not present

## 2022-08-03 DIAGNOSIS — N179 Acute kidney failure, unspecified: Secondary | ICD-10-CM | POA: Diagnosis present

## 2022-08-03 DIAGNOSIS — I10 Essential (primary) hypertension: Secondary | ICD-10-CM | POA: Diagnosis present

## 2022-08-03 DIAGNOSIS — J101 Influenza due to other identified influenza virus with other respiratory manifestations: Secondary | ICD-10-CM | POA: Diagnosis present

## 2022-08-03 DIAGNOSIS — Z886 Allergy status to analgesic agent status: Secondary | ICD-10-CM | POA: Diagnosis not present

## 2022-08-03 DIAGNOSIS — Z87891 Personal history of nicotine dependence: Secondary | ICD-10-CM | POA: Diagnosis not present

## 2022-08-03 DIAGNOSIS — R4189 Other symptoms and signs involving cognitive functions and awareness: Secondary | ICD-10-CM | POA: Diagnosis present

## 2022-08-03 DIAGNOSIS — J1008 Influenza due to other identified influenza virus with other specified pneumonia: Secondary | ICD-10-CM | POA: Diagnosis present

## 2022-08-03 DIAGNOSIS — I69351 Hemiplegia and hemiparesis following cerebral infarction affecting right dominant side: Secondary | ICD-10-CM | POA: Diagnosis not present

## 2022-08-03 DIAGNOSIS — E785 Hyperlipidemia, unspecified: Secondary | ICD-10-CM | POA: Diagnosis present

## 2022-08-03 DIAGNOSIS — I480 Paroxysmal atrial fibrillation: Secondary | ICD-10-CM | POA: Diagnosis present

## 2022-08-03 DIAGNOSIS — Z7401 Bed confinement status: Secondary | ICD-10-CM | POA: Diagnosis not present

## 2022-08-03 DIAGNOSIS — E876 Hypokalemia: Secondary | ICD-10-CM | POA: Diagnosis present

## 2022-08-03 LAB — URINALYSIS, ROUTINE W REFLEX MICROSCOPIC
Bacteria, UA: NONE SEEN
Bilirubin Urine: NEGATIVE
Glucose, UA: NEGATIVE mg/dL
Hgb urine dipstick: NEGATIVE
Ketones, ur: NEGATIVE mg/dL
Leukocytes,Ua: NEGATIVE
Nitrite: NEGATIVE
Protein, ur: 30 mg/dL — AB
Specific Gravity, Urine: 1.015 (ref 1.005–1.030)
pH: 5 (ref 5.0–8.0)

## 2022-08-03 LAB — BASIC METABOLIC PANEL
Anion gap: 10 (ref 5–15)
BUN: 23 mg/dL (ref 8–23)
CO2: 27 mmol/L (ref 22–32)
Calcium: 8 mg/dL — ABNORMAL LOW (ref 8.9–10.3)
Chloride: 99 mmol/L (ref 98–111)
Creatinine, Ser: 1.21 mg/dL (ref 0.61–1.24)
GFR, Estimated: 59 mL/min — ABNORMAL LOW (ref >60)
Glucose, Bld: 94 mg/dL (ref 70–99)
Potassium: 2.8 mmol/L — ABNORMAL LOW (ref 3.5–5.1)
Sodium: 136 mmol/L (ref 135–145)

## 2022-08-03 LAB — CBC
HCT: 33.3 % — ABNORMAL LOW (ref 39.0–52.0)
Hemoglobin: 10.4 g/dL — ABNORMAL LOW (ref 13.0–17.0)
MCH: 30.7 pg (ref 26.0–34.0)
MCHC: 31.2 g/dL (ref 30.0–36.0)
MCV: 98.2 fL (ref 80.0–100.0)
Platelets: 140 10*3/uL — ABNORMAL LOW (ref 150–400)
RBC: 3.39 MIL/uL — ABNORMAL LOW (ref 4.22–5.81)
RDW: 15.8 % — ABNORMAL HIGH (ref 11.5–15.5)
WBC: 7.1 10*3/uL (ref 4.0–10.5)
nRBC: 0 % (ref 0.0–0.2)

## 2022-08-03 MED ORDER — SODIUM CHLORIDE 0.9 % IV SOLN
1.0000 g | INTRAVENOUS | Status: DC
  Administered 2022-08-03: 1 g via INTRAVENOUS
  Filled 2022-08-03 (×2): qty 10

## 2022-08-03 MED ORDER — SODIUM CHLORIDE 0.9 % IV SOLN
500.0000 mg | INTRAVENOUS | Status: DC
  Administered 2022-08-03: 500 mg via INTRAVENOUS
  Filled 2022-08-03: qty 5

## 2022-08-03 MED ORDER — POTASSIUM CHLORIDE CRYS ER 20 MEQ PO TBCR
40.0000 meq | EXTENDED_RELEASE_TABLET | Freq: Two times a day (BID) | ORAL | Status: DC
  Administered 2022-08-03 – 2022-08-04 (×3): 40 meq via ORAL
  Filled 2022-08-03 (×3): qty 2

## 2022-08-03 NOTE — Progress Notes (Signed)
PROGRESS NOTE    SAAD BRANSFIELD  [id number] DOB: 08-03-1934 DOA: 08/02/2022 PCP: [facility]    Brief Narrative:  88 year old with history of stroke and right-sided hemiplegia, paroxysmal A-fib on Xarelto, hypertension hyperlipidemia sent from long-term care with cough.  Apparently he was diagnosed with influenza on 12/28 and has been coughing since then.  Patient does not know why he was sent.  In the emergency room temperature 1-2.3, initially hypertensive.  On room air.  He was found to have COVID and influenza positive.  Admitted due to significant symptoms and morbidity from nursing home.   Assessment & Plan:   Influenza A infection with debility: Diagnosed 12/28, no benefit with Tamiflu.  Discontinue.  Chest physiotherapy and mobility.  COVID-19 virus infection: Also with left-sided infiltrate on chest x-ray.  On room air.  Procalcitonin 1.  Blood cultures negative so far.  Mildly symptomatic.  On room air. Supportive treatment. Remdesivir day 2/3. Empiric antibiotics Rocephin and azithromycin day 2 today. Patient is bedbound and does not mobilize at nursing home.  Attempt chest physiotherapy.  Acute kidney injury: Improved and normalized.  Hypokalemia: Severe and persistent.  Replace aggressively.  Recheck levels tomorrow morning.  Also check magnesium phosphorus.  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  Therapeutic on Xarelto. Essential hypertension, blood pressure stable.  Due to significant symptoms he needs to be monitored in the hospital today.  Anticipate back to nursing home tomorrow if stable.   DVT prophylaxis:  rivaroxaban (XARELTO) tablet 20 mg   Code Status: Full code Family Communication: Wife on the phone Disposition Plan: Status is: Observation The patient will require care spanning > 2 midnights and should be moved to inpatient because: Hypokalemia, fever      Consultants:  None  Procedures:  None  Antimicrobials:  Rocephin and azithromycin remdesivir 1/2---   Subjective: Patient seen and examined in the emergency room.  Denies any complaints.  He was alert awake and interactive.  Complained of pain if I touch his right side of the leg.  Denies any chest pain or shortness of breath.  For historian overall.  Discussed with patient's wife on the phone.  Patient does not walk and mostly bedbound.  Patient's wife tells me that he keeps getting COVID and was diagnosed with COVID about 4 weeks ago.  Objective: Vitals:   08/03/22 0800 08/03/22 0900 08/03/22 1000 08/03/22 1030  BP: (!) 162/73 (!) 162/83 136/79 (!) 141/85  Pulse: (!) 57 (!) 57 70 78  Resp: (!) 25 15 18 18   Temp:  97.9 F (36.6 C)    TempSrc:  Oral    SpO2: 98% 98% 96% 96%    Intake/Output Summary (Last 24 hours) at 08/03/2022 1136 Last data filed at 08/03/2022 [id number] Gross per 24 hour  Intake 1640 ml  Output --  Net 1640 ml   There were no vitals filed for this visit.  Examination:  General exam: Appears calm and comfortable  Chronically sick looking.  Frail and debilitated. Alert and awake.  He is oriented x 2-3.  He knows he is in the hospital.  He is oriented to himself.  Not oriented to time or circumstances. Respiratory system: Clear to auscultation. Respiratory effort normal.  Upper airway sounds. Cardiovascular system: S1 & S2 heard, RRR.  Gastrointestinal system: Abdomen is nondistended, soft and nontender. No organomegaly or masses felt. Normal bowel sounds heard. Central nervous system:  Right upper and lower extremity with flexion contracture.  Power 2/5.    Data Reviewed: I have personally reviewed following labs and imaging studies  CBC: Recent Labs  Lab 08/02/22 1900 08/03/22 0350  WBC 6.1 7.1  NEUTROABS 4.4  --   HGB 13.7 10.4*  HCT 44.0 33.3*  MCV 97.8 98.2  PLT 159 253*   Basic Metabolic Panel: Recent Labs  Lab 08/02/22 1900  08/03/22 0523  NA 138 136  K 3.4* 2.8*  CL 98 99  CO2 28 27  GLUCOSE 111* 94  BUN 32* 23  CREATININE 1.42* 1.21  CALCIUM 8.8* 8.0*   GFR: CrCl cannot be calculated (Unknown ideal weight.). Liver Function Tests: Recent Labs  Lab 08/02/22 1900  AST 28  ALT 18  ALKPHOS 53  BILITOT 1.5*  PROT 8.4*  ALBUMIN 4.0   No results for input(s): "LIPASE", "AMYLASE" in the last 168 hours. No results for input(s): "AMMONIA" in the last 168 hours. Coagulation Profile: Recent Labs  Lab 08/02/22 1900  INR 1.6*   Cardiac Enzymes: No results for input(s): "CKTOTAL", "CKMB", "CKMBINDEX", "TROPONINI" in the last 168 hours. BNP (last 3 results) No results for input(s): "PROBNP" in the last 8760 hours. HbA1C: No results for input(s): "HGBA1C" in the last 72 hours. CBG: No results for input(s): "GLUCAP" in the last 168 hours. Lipid Profile: No results for input(s): "CHOL", "HDL", "LDLCALC", "TRIG", "CHOLHDL", "LDLDIRECT" in the last 72 hours. Thyroid Function Tests: No results for input(s): "TSH", "T4TOTAL", "FREET4", "T3FREE", "THYROIDAB" in the last 72 hours. Anemia Panel: No results for input(s): "VITAMINB12", "FOLATE", "FERRITIN", "TIBC", "IRON", "RETICCTPCT" in the last 72 hours. Sepsis Labs: Recent Labs  Lab 08/02/22 1859 08/02/22 2127  PROCALCITON  --  1.10  LATICACIDVEN 1.6 1.2    Recent Results (from the past 240 hour(s))  Blood Culture (routine x 2)     Status: None (Preliminary result)   Collection Time: 08/02/22  7:02 PM   Specimen: BLOOD  Result Value Ref Range Status   Specimen Description   Final    BLOOD LEFT ANTECUBITAL Performed at [facility] [address], [place], [place] [address]    Special Requests   Final    BOTTLES DRAWN AEROBIC AND ANAEROBIC Blood Culture adequate volume Performed at [facility] [address] [place], [place] [address]    Culture   Final    NO GROWTH < 12 HOURS Performed at [facility] [address], [place], [place] [address]    Report Status PENDING  Incomplete  Blood Culture (routine x 2)     Status: None (Preliminary result)   Collection Time: 08/02/22  7:19 PM   Specimen: BLOOD  Result Value Ref Range Status   Specimen Description   Final    BLOOD BLOOD LEFT FOREARM Performed at [facility] [address], [place], [place] [address]    Special Requests   Final    BOTTLES DRAWN AEROBIC AND ANAEROBIC Blood Culture adequate volume Performed at [facility] [address], [place], [place] [address]    Culture   Final    NO GROWTH < 12 HOURS Performed at [facility] [address], [place], [place] [address]    Report Status PENDING  Incomplete  Resp panel by RT-PCR (RSV, Flu A&B, Covid) Anterior Nasal Swab     Status: Abnormal   Collection Time: 08/02/22  7:27 PM   Specimen: Anterior Nasal Swab  Result Value Ref Range Status   SARS  Coronavirus 2 by RT PCR POSITIVE (A) NEGATIVE Final    Comment: (NOTE) SARS-CoV-2 target nucleic acids are DETECTED.  The SARS-CoV-2 RNA is generally detectable in upper respiratory specimens during the acute phase of infection. Positive results are indicative of the presence of the identified virus, but do not rule out bacterial infection or co-infection with other pathogens not detected by the test. Clinical correlation with patient history and other diagnostic information is necessary to determine patient infection status. The expected result is Negative.  Fact Sheet for Patients: EntrepreneurPulse.com.au  Fact Sheet for Healthcare Providers: IncredibleEmployment.be  This test is not yet approved or cleared by the [place] FDA and  has been authorized for detection and/or diagnosis of SARS-CoV-2 by FDA under an Emergency Use Authorization (EUA).  This EUA will remain in effect (meaning this test can be used) for the  duration of  the COVID-19 declaration under Section 564(b)(1) of the A ct, 21 U.S.C. section 360bbb-3(b)(1), unless the authorization is terminated or revoked sooner.     Influenza A by PCR POSITIVE (A) NEGATIVE Final   Influenza B by PCR NEGATIVE NEGATIVE Final    Comment: (NOTE) The Xpert Xpress SARS-CoV-2/FLU/RSV plus assay is intended as an aid in the diagnosis of influenza from Nasopharyngeal swab specimens and should not be used as a sole basis for treatment. Nasal washings and aspirates are unacceptable for Xpert Xpress SARS-CoV-2/FLU/RSV testing.  Fact Sheet for Patients: EntrepreneurPulse.com.au  Fact Sheet for Healthcare Providers: IncredibleEmployment.be  This test is not yet approved or cleared by the [place] FDA and has been authorized for detection and/or diagnosis of SARS-CoV-2 by FDA under an Emergency Use Authorization (EUA). This EUA will remain in effect (meaning this test can be used) for the duration of the COVID-19 declaration under Section 564(b)(1) of the Act, 21 U.S.C. section 360bbb-3(b)(1), unless the authorization is terminated or revoked.     Resp Syncytial Virus by PCR NEGATIVE NEGATIVE Final    Comment: (NOTE) Fact Sheet for Patients: EntrepreneurPulse.com.au  Fact Sheet for Healthcare Providers: IncredibleEmployment.be  This test is not yet approved or cleared by the [place] FDA and has been authorized for detection and/or diagnosis of SARS-CoV-2 by FDA under an Emergency Use Authorization (EUA). This EUA will remain in effect (meaning this test can be used) for the duration of the COVID-19 declaration under Section 564(b)(1) of the Act, 21 U.S.C. section 360bbb-3(b)(1), unless the authorization is terminated or revoked.  Performed at [facility], [place] [address] [place], [place] [address]          Radiology Studies: [facility] Chest  Port 1 View  Result Date: 08/02/2022 CLINICAL DATA:  Hemoptysis. EXAM: PORTABLE CHEST 1 VIEW COMPARISON:  Dec 10, 2020 FINDINGS: The cardiac silhouette is mildly enlarged and unchanged in size. A radiopaque loop recorder device is seen. Low lung volumes are noted. Mild infiltrate is noted within the left lung base. There is no evidence of a pleural effusion or pneumothorax. Multilevel degenerative changes are seen throughout the thoracic spine. IMPRESSION: Low lung volumes with mild left basilar infiltrate. Electronically Signed   By: Virgina Norfolk M.D.   On: 08/02/2022 19:52        Scheduled Meds:  potassium chloride  40 mEq Oral BID   rivaroxaban  20 mg Oral Q supper   Continuous Infusions:  sodium chloride 75 mL/hr at 08/02/22 2126   azithromycin     cefTRIAXone (ROCEPHIN)  IV     remdesivir 100 mg in sodium  chloride 0.9 % 100 mL IVPB       LOS: 0 days    Time spent: 35 minutes    Barb Merino, MD [facility] Pager [phone]

## 2022-08-04 ENCOUNTER — Other Ambulatory Visit: Payer: Self-pay

## 2022-08-04 DIAGNOSIS — J101 Influenza due to other identified influenza virus with other respiratory manifestations: Secondary | ICD-10-CM | POA: Diagnosis not present

## 2022-08-04 LAB — CBC WITH DIFFERENTIAL/PLATELET
Abs Immature Granulocytes: 0.02 10*3/uL (ref 0.00–0.07)
Basophils Absolute: 0 10*3/uL (ref 0.0–0.1)
Basophils Relative: 0 %
Eosinophils Absolute: 0.2 10*3/uL (ref 0.0–0.5)
Eosinophils Relative: 3 %
HCT: 41.7 % (ref 39.0–52.0)
Hemoglobin: 12.9 g/dL — ABNORMAL LOW (ref 13.0–17.0)
Immature Granulocytes: 0 %
Lymphocytes Relative: 30 %
Lymphs Abs: 1.8 10*3/uL (ref 0.7–4.0)
MCH: 30.2 pg (ref 26.0–34.0)
MCHC: 30.9 g/dL (ref 30.0–36.0)
MCV: 97.7 fL (ref 80.0–100.0)
Monocytes Absolute: 0.7 10*3/uL (ref 0.1–1.0)
Monocytes Relative: 11 %
Neutro Abs: 3.4 10*3/uL (ref 1.7–7.7)
Neutrophils Relative %: 56 %
Platelets: 184 10*3/uL (ref 150–400)
RBC: 4.27 MIL/uL (ref 4.22–5.81)
RDW: 15.9 % — ABNORMAL HIGH (ref 11.5–15.5)
WBC: 6.2 10*3/uL (ref 4.0–10.5)
nRBC: 0 % (ref 0.0–0.2)

## 2022-08-04 LAB — BASIC METABOLIC PANEL
Anion gap: 9 (ref 5–15)
BUN: 16 mg/dL (ref 8–23)
CO2: 23 mmol/L (ref 22–32)
Calcium: 8.6 mg/dL — ABNORMAL LOW (ref 8.9–10.3)
Chloride: 108 mmol/L (ref 98–111)
Creatinine, Ser: 0.9 mg/dL (ref 0.61–1.24)
GFR, Estimated: >60 mL/min (ref >60)
Glucose, Bld: 94 mg/dL (ref 70–99)
Potassium: 3.6 mmol/L (ref 3.5–5.1)
Sodium: 140 mmol/L (ref 135–145)

## 2022-08-04 LAB — PHOSPHORUS: Phosphorus: 2.2 mg/dL — ABNORMAL LOW (ref 2.5–4.6)

## 2022-08-04 LAB — MAGNESIUM: Magnesium: 2.1 mg/dL (ref 1.7–2.4)

## 2022-08-04 MED ORDER — AMOXICILLIN-POT CLAVULANATE 875-125 MG PO TABS: 1.0000 | ORAL_TABLET | Freq: Two times a day (BID) | ORAL | 0 refills | Status: AC

## 2022-08-04 MED ORDER — SODIUM CHLORIDE 0.9 % IV SOLN
100.0000 mg | Freq: Every day | INTRAVENOUS | Status: AC
  Administered 2022-08-04: 100 mg via INTRAVENOUS
  Filled 2022-08-04: qty 20

## 2022-08-04 NOTE — TOC Transition Note (Addendum)
Transition of Care [facility]) - CM/SW Discharge Note   Patient Details  Name: CLETO CLAGGETT MRN: [id number] Date of Birth: 1935-04-15  [facility] CM/SW Contact:  Vassie Moselle, LCSW Phone Number: 08/04/2022, 11:21 AM   Clinical Narrative:    Pt coming from [place] where he is a LTC resident. Spoke with pt's spouse and confirmed pt's return to [facility].  Eddie North able to accept pt back today. Pt will be going to room 211. RN to call report to [phone]. Pt will be transported to facility via [facility]. [facility] called for transport at 11:33am   Final next level of care: Long Term Nursing Home Barriers to Discharge: No Barriers Identified   Patient Goals and CMS Choice CMS Medicare.gov Compare Post Acute Care list provided to:: Patient Represenative (must comment) Choice offered to / list presented to : Spouse  Discharge Placement                Patient chooses bed at: [facility] Patient to be transferred to facility by: [facility] Name of family member notified: Spouse, Les Longmore Patient and family notified of of transfer: 08/04/22  Discharge Plan and Services Additional resources added to the After Visit Summary for                  DME Arranged: N/A DME Agency: NA                  Social Determinants of Health ([place]) Interventions SDOH Screenings   Food Insecurity: No Food Insecurity (02/28/2020)  Housing: Low Risk  (02/28/2020)  Transportation Needs: No Transportation Needs (02/28/2020)  Depression (PHQ2-9): Low Risk  (02/28/2020)  Financial Resource Strain: Low Risk  (02/28/2020)  Physical Activity: Inactive (02/28/2020)  Social Connections: Moderately Integrated (02/28/2020)  Stress: No Stress Concern Present (02/28/2020)  Tobacco Use: Medium Risk (08/03/2022)     Readmission Risk Interventions    08/04/2022   11:18 AM  Readmission Risk Prevention Plan  Post Dischage Appt Complete  Medication Screening Complete  Transportation  Screening Complete

## 2022-08-04 NOTE — Plan of Care (Signed)

## 2022-08-04 NOTE — NC FL2 (Signed)
[place] MEDICAID FL2 LEVEL OF CARE FORM     IDENTIFICATION  Patient Name: Steven Kent Birthdate: Jun 05, 1935 Sex: male Admission Date (Current Location): 08/02/2022  [facility] and [place] Number:  Herbalist and Address:  [facility],  [place], [place]      Provider Number: [id number]  Attending Physician Name and Address:  Barb Merino, MD  Relative Name and Phone Number:  [phone]    Current Level of Care: Hospital Recommended Level of Care: Nursing Facility Prior Approval Number:    Date Approved/Denied:   PASRR Number:    Discharge Plan: Other (Comment) (Nursing facility)    Current Diagnoses: Patient Active Problem List   Diagnosis Date Noted   COVID-19 08/03/2022   Influenza A 08/02/2022   COVID-19 virus infection 08/02/2022   Paroxysmal atrial fibrillation ([place]) 08/02/2022   Cognitive impairment 08/02/2022   AKI (acute kidney injury) ([place]) 08/02/2022   History of stroke 07/27/2021   History of loop recorder 07/27/2021   Wound of right leg 12/29/2020   Pressure injury of skin 12/13/2020   Cellulitis of leg, right 12/11/2020   Pulmonary vascular congestion 12/11/2020   Rhabdomyolysis 12/11/2020   Lactic acid acidosis 12/11/2020   Essential hypertension 08/16/2018   Hypertensive kidney disease with chronic kidney disease stage III 08/16/2018   VTE (venous thromboembolism) 08/16/2018   Peripheral neuropathy 08/16/2018   Glaucoma 08/16/2018   Onychomycosis 08/16/2018   Leg edema 08/16/2018   Hyperlipidemia 08/16/2018   Overactive bladder 08/16/2018   Coronary artery disease involving native heart without angina pectoris 08/16/2018   Hemiparesis affecting right side as late effect of cerebrovascular accident [facility])    Abdominal aortic aneurysm (AAA) ([place])     Orientation RESPIRATION BLADDER Height & Weight     Self, Place  Normal Incontinent, External catheter Weight: 180 lb 12.4 oz (82 kg) Height:   6\' 2"  (188 cm)  BEHAVIORAL SYMPTOMS/MOOD NEUROLOGICAL BOWEL NUTRITION STATUS      Incontinent Diet (Regular)  AMBULATORY STATUS COMMUNICATION OF NEEDS Skin   Limited Assist Verbally Normal                       Personal Care Assistance Level of Assistance  Bathing, Feeding, Dressing Bathing Assistance: Limited assistance Feeding assistance: Independent Dressing Assistance: Limited assistance     Functional Limitations Info  Sight, Hearing, Speech Sight Info: Adequate Hearing Info: Adequate Speech Info: Adequate    SPECIAL CARE FACTORS FREQUENCY                       Contractures Contractures Info: Not present    Additional Factors Info  Code Status, Allergies Code Status Info: FULL Allergies Info: Aspirin           Current Medications (08/04/2022):  This is the current hospital active medication list Current Facility-Administered Medications  Medication Dose Route Frequency Provider Last Rate Last Admin   acetaminophen (TYLENOL) tablet 650 mg  650 mg Oral Q6H PRN Tu, Ching T, DO   650 mg at 08/02/22 2228   azithromycin (ZITHROMAX) 500 mg in sodium chloride 0.9 % 250 mL IVPB  500 mg Intravenous Q24H Tu, Ching T, DO   Stopped at 08/03/22 2258   cefTRIAXone (ROCEPHIN) 1 g in sodium chloride 0.9 % 100 mL IVPB  1 g Intravenous Q24H Tu, Ching T, DO   Stopped at 08/03/22 2141   potassium chloride SA (KLOR-CON M) CR tablet 40 mEq  40 mEq Oral BID Barb Merino, MD   40 mEq at 08/04/22 8088   remdesivir 100 mg in sodium chloride 0.9 % 100 mL IVPB  100 mg Intravenous Daily Barb Merino, MD       rivaroxaban Alveda Reasons) tablet 20 mg  20 mg Oral Q supper Tu, Ching T, DO   20 mg at 08/03/22 1103     Discharge Medications: Please see discharge summary for a list of discharge medications.  Relevant Imaging Results:  Relevant Lab Results:   Additional Information SSN: [id number]  Vassie Moselle, LCSW

## 2022-08-04 NOTE — Progress Notes (Signed)
Report called and given to April Crayton, LPN of [facility].  All questions answered and when [facility] arrives, pt. will transport back to [facility] to room 211.

## 2022-08-04 NOTE — Discharge Summary (Signed)
Physician Discharge Summary  Steven Kent PZW:[id number] DOB: 1935-02-10 DOA: 08/02/2022  PCP: Lacinda Axon  Admit date: 08/02/2022 Discharge date: 08/04/2022  Admitted From: Long-term care nursing home Disposition: Long-term care nursing home  Recommendations for Outpatient Follow-up:  Follow up with PCP in 1-2 weeks at the nursing home Standard precautions, masking advised.  Home Health: N/A Equipment/Devices: N/A  Discharge Condition: Stable CODE STATUS: Full code Diet recommendation: Low-salt diet  Discharge summary: 88 year old with history of stroke and right-sided hemiplegia, paroxysmal A-fib on Xarelto, hypertension, hyperlipidemia sent from long-term care with cough.  Apparently he was diagnosed with influenza on 12/28 and has been coughing since then.  Patient does not know why he was sent.  In the emergency room temperature 102.3.  initially hypertensive.  On room air.  He was found to have COVID and influenza positive.  Admitted due to significant symptoms and morbidity from nursing home.  # Influenza A infection with debility: Diagnosed 12/28, no benefit with Tamiflu.  Discontinue.  Chest physiotherapy and mobility.  Clinically improved.   # COVID-19 virus infection: Also with left-sided infiltrate on chest x-ray.  On room air.  Procalcitonin 1.  Blood cultures negative so far.  On room air. Supportive treatment. Remdesivir day 3/3. Treated with Rocephin azithromycin.  Received 2 doses.  Does not have any evidence of ongoing bacterial infection.  Due to respiratory symptoms, will continue 5 days of Augmentin. Patient is bedbound and does not mobilize at nursing home.  Attempt chest physiotherapy, mucolytic's and bronchodilators at home.   Acute kidney injury: Improved and normalized.   Hypokalemia: Replaced aggressively with improvement.   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  Therapeutic  on Xarelto. Essential hypertension, blood pressure stable.   Medically stabilized to transfer back to skilled nursing level of care today.      Discharge Diagnoses:  Principal Problem:   Influenza A Active Problems:   COVID-19 virus infection   Hemiparesis affecting right side as late effect of cerebrovascular accident [facility])   Essential hypertension   Paroxysmal atrial fibrillation (HCC)   Cognitive impairment   AKI (acute kidney injury) (HCC)   COVID-19    Discharge Instructions  Discharge Instructions     Diet - low sodium heart healthy   Complete by: As directed    Increase activity slowly   Complete by: As directed       Allergies as of 08/04/2022       Reactions   Aspirin Other (See Comments)   Full Strength Asprin-325 mg causes upset stomach. Can take 81 mg. "ALLERGIC," per Mar        Medication List     STOP taking these medications    Tamiflu 75 MG capsule Generic drug: oseltamivir       TAKE these medications    acetaminophen-codeine 300-30 MG tablet Commonly known as: TYLENOL #3 Take 1 tablet by mouth daily as needed for severe pain.   amoxicillin-clavulanate 875-125 MG tablet Commonly known as: AUGMENTIN Take 1 tablet by mouth 2 (two) times daily for 5 days.   ascorbic acid 500 MG tablet Commonly known as: VITAMIN C Take 500 mg by mouth daily.   atorvastatin 40 MG tablet Commonly known as: LIPITOR TAKE 1 TABLET(40 MG) BY MOUTH EVERY EVENING What changed: See the new instructions.   baclofen 10 MG tablet Commonly known as: LIORESAL Take 10 mg by mouth 2 (two) times daily.   brimonidine 0.15 % ophthalmic solution Commonly known  as: ALPHAGAN Place 1 drop into both eyes daily.   chlorhexidine 0.12 % solution Commonly known as: PERIDEX 10 mLs by Mouth Rinse route See admin instructions. Swish 10 ml's as directed in the mouth on Mon/Wed/Fri- rinse and spit   donepezil 10 MG tablet Commonly known as: ARICEPT Take 10 mg by mouth  at bedtime.   dorzolamide 2 % ophthalmic solution Commonly known as: TRUSOPT Place 1 drop into both eyes 2 (two) times daily.   gabapentin 800 MG tablet Commonly known as: NEURONTIN Take 800 mg by mouth 3 (three) times daily.   latanoprost 0.005 % ophthalmic solution Commonly known as: XALATAN Place 1 drop into both eyes at bedtime.   lidocaine 4 % Place 1 patch onto the skin See admin instructions. Apply 1 patch to 3 sites (right arm, right forearm, and right lateral ankle) every morning and remove per schedule   losartan 25 MG tablet Commonly known as: COZAAR Take 12.5 mg by mouth in the morning.   melatonin 5 MG Tabs Take 5 mg by mouth at bedtime.   mineral oil-hydrophilic petrolatum ointment Apply topically as needed for dry skin (bilateral legs and feet).   oxybutynin 5 MG 24 hr tablet Commonly known as: DITROPAN-XL Take 5 mg by mouth at bedtime. What changed: Another medication with the same name was removed. Continue taking this medication, and follow the directions you see here.   torsemide 20 MG tablet Commonly known as: DEMADEX Take 20 mg by mouth daily.   TYLENOL 500 MG tablet Generic drug: acetaminophen Take 1,000 mg by mouth 2 (two) times daily.   Vitamin D (Ergocalciferol) 1.25 MG (50000 UNIT) Caps capsule Commonly known as: DRISDOL TAKE 1 CAPSULE BY MOUTH EVERY 7 DAYS What changed: when to take this   Xarelto 20 MG Tabs tablet Generic drug: rivaroxaban TAKE 1 TABLET(20 MG) BY MOUTH EVERY EVENING What changed: See the new instructions.        Allergies  Allergen Reactions   Aspirin Other (See Comments)    Full Strength Asprin-325 mg causes upset stomach. Can take 81 mg. "ALLERGIC," per Mar    Consultations: None   Procedures/Studies: DG Chest Port 1 View  Result Date: 08/02/2022 CLINICAL DATA:  Hemoptysis. EXAM: PORTABLE CHEST 1 VIEW COMPARISON:  Dec 10, 2020 FINDINGS: The cardiac silhouette is mildly enlarged and unchanged in size. A  radiopaque loop recorder device is seen. Low lung volumes are noted. Mild infiltrate is noted within the left lung base. There is no evidence of a pleural effusion or pneumothorax. Multilevel degenerative changes are seen throughout the thoracic spine. IMPRESSION: Low lung volumes with mild left basilar infiltrate. Electronically Signed   By: Aram Candela M.D.   On: 08/02/2022 19:52   CUP PACEART REMOTE DEVICE CHECK  Result Date: 07/19/2022 ILR summary report received. Battery status OK. Normal device function. No new symptom, tachy, brady, or pause episodes. No new AF episodes. Monthly summary reports and ROV/PRN LA  (Echo, Carotid, EGD, Colonoscopy, ERCP)    Subjective: Patient seen and examined.  Denies any complaints.  Afebrile.  Occasional dry cough.  On room air.   Discharge Exam: Vitals:   08/04/22 0900 08/04/22 1000  BP:    Pulse:    Resp: 16 18  Temp:    SpO2:     Vitals:   08/04/22 0800 08/04/22 0829 08/04/22 0900 08/04/22 1000  BP:  (!) 162/90    Pulse:  65    Resp: 11 20 16 18   Temp:  99 F (37.2 C)    TempSrc:  Oral    SpO2:  100%    Weight:      Height:        General: Pt is alert, awake, not in acute distress Pleasant to conversation.  Alert awake and oriented x 2-3.  He is on room air and eating breakfast.  Uses left hand due to hemiplegia on the right side. Patient does have dense hemiplegia on the right upper and lower extremities. Cardiovascular: RRR, S1/S2 +, no rubs, no gallops Respiratory: CTA bilaterally, no wheezing, no rhonchi, no added sounds. Abdominal: Soft, NT, ND, bowel sounds + Extremities: no edema, no cyanosis    The results of significant diagnostics from this hospitalization (including imaging, microbiology, ancillary and laboratory) are listed below for reference.     Microbiology: Recent Results (from the past 240 hour(s))  Blood Culture (routine x 2)     Status: None (Preliminary result)   Collection Time: 08/02/22  7:02 PM    Specimen: BLOOD  Result Value Ref Range Status   Specimen Description   Final    BLOOD LEFT ANTECUBITAL Performed at [facility], [address] [place], [place] [address]    Special Requests   Final    BOTTLES DRAWN AEROBIC AND ANAEROBIC Blood Culture adequate volume Performed at [facility], [address], [place], [place] [address]    Culture   Final    NO GROWTH 2 DAYS Performed at [facility], [address] [place], [place] [address]    Report Status PENDING  Incomplete  Blood Culture (routine x 2)     Status: None (Preliminary result)   Collection Time: 08/02/22  7:19 PM   Specimen: BLOOD  Result Value Ref Range Status   Specimen Description   Final    BLOOD BLOOD LEFT FOREARM Performed at [facility], [address], [place], [place] [address]    Special Requests   Final    BOTTLES DRAWN AEROBIC AND ANAEROBIC Blood Culture adequate volume Performed at [facility], [address] [place], [place] [address]    Culture   Final    NO GROWTH 2 DAYS Performed at [facility], [address] [place], [place] [address]    Report Status PENDING  Incomplete  Resp panel by RT-PCR (RSV, Flu A&B, Covid) Anterior Nasal Swab     Status: Abnormal   Collection Time: 08/02/22  7:27 PM   Specimen: Anterior Nasal Swab  Result Value Ref Range Status   SARS Coronavirus 2 by RT PCR POSITIVE (A) NEGATIVE Final    Comment: (NOTE) SARS-CoV-2 target nucleic acids are DETECTED.  The SARS-CoV-2 RNA is generally detectable in upper respiratory specimens during the acute phase of infection. Positive results are indicative of the presence of the identified virus, but do not rule out bacterial infection or co-infection with other pathogens not detected by the test. Clinical correlation with patient history and other diagnostic information is necessary to determine patient infection status. The  expected result is Negative.  Fact Sheet for Patients: BloggerCourse.com  Fact Sheet for Healthcare Providers: SeriousBroker.it  This test is not yet approved or cleared by the [place] FDA and  has been authorized for detection and/or diagnosis of SARS-CoV-2 by FDA under an Emergency Use Authorization (EUA).  This EUA will remain in effect (meaning this test can be used) for the duration of  the COVID-19 declaration under Section 564(b)(1)  of the A ct, 21 U.S.C. section 360bbb-3(b)(1), unless the authorization is terminated or revoked sooner.     Influenza A by PCR POSITIVE (A) NEGATIVE Final   Influenza B by PCR NEGATIVE NEGATIVE Final    Comment: (NOTE) The Xpert Xpress SARS-CoV-2/FLU/RSV plus assay is intended as an aid in the diagnosis of influenza from Nasopharyngeal swab specimens and should not be used as a sole basis for treatment. Nasal washings and aspirates are unacceptable for Xpert Xpress SARS-CoV-2/FLU/RSV testing.  Fact Sheet for Patients: EntrepreneurPulse.com.au  Fact Sheet for Healthcare Providers: IncredibleEmployment.be  This test is not yet approved or cleared by the [place] FDA and has been authorized for detection and/or diagnosis of SARS-CoV-2 by FDA under an Emergency Use Authorization (EUA). This EUA will remain in effect (meaning this test can be used) for the duration of the COVID-19 declaration under Section 564(b)(1) of the Act, 21 U.S.C. section 360bbb-3(b)(1), unless the authorization is terminated or revoked.     Resp Syncytial Virus by PCR NEGATIVE NEGATIVE Final    Comment: (NOTE) Fact Sheet for Patients: EntrepreneurPulse.com.au  Fact Sheet for Healthcare Providers: IncredibleEmployment.be  This test is not yet approved or cleared by the [place] FDA and has been authorized for detection and/or  diagnosis of SARS-CoV-2 by FDA under an Emergency Use Authorization (EUA). This EUA will remain in effect (meaning this test can be used) for the duration of the COVID-19 declaration under Section 564(b)(1) of the Act, 21 U.S.C. section 360bbb-3(b)(1), unless the authorization is terminated or revoked.  Performed at [facility], [place] [address], [place], [place] [address]      Labs: BNP (last 3 results) No results for input(s): "BNP" in the last 8760 hours. Basic Metabolic Panel: Recent Labs  Lab 08/02/22 1900 08/03/22 0523 08/04/22 0529  NA 138 136 140  K 3.4* 2.8* 3.6  CL 98 99 108  CO2 28 27 23   GLUCOSE 111* 94 94  BUN 32* 23 16  CREATININE 1.42* 1.21 0.90  CALCIUM 8.8* 8.0* 8.6*  MG  --   --  2.1  PHOS  --   --  2.2*   Liver Function Tests: Recent Labs  Lab 08/02/22 1900  AST 28  ALT 18  ALKPHOS 53  BILITOT 1.5*  PROT 8.4*  ALBUMIN 4.0   No results for input(s): "LIPASE", "AMYLASE" in the last 168 hours. No results for input(s): "AMMONIA" in the last 168 hours. CBC: Recent Labs  Lab 08/02/22 1900 08/03/22 0350 08/04/22 0529  WBC 6.1 7.1 6.2  NEUTROABS 4.4  --  3.4  HGB 13.7 10.4* 12.9*  HCT 44.0 33.3* 41.7  MCV 97.8 98.2 97.7  PLT 159 140* 184   Cardiac Enzymes: No results for input(s): "CKTOTAL", "CKMB", "CKMBINDEX", "TROPONINI" in the last 168 hours. BNP: Invalid input(s): "POCBNP" CBG: No results for input(s): "GLUCAP" in the last 168 hours. D-Dimer No results for input(s): "DDIMER" in the last 72 hours. Hgb A1c No results for input(s): "HGBA1C" in the last 72 hours. Lipid Profile No results for input(s): "CHOL", "HDL", "LDLCALC", "TRIG", "CHOLHDL", "LDLDIRECT" in the last 72 hours. Thyroid function studies No results for input(s): "TSH", "T4TOTAL", "T3FREE", "THYROIDAB" in the last 72 hours.  Invalid input(s): "FREET3" Anemia work up No results for input(s): "VITAMINB12", "FOLATE", "FERRITIN", "TIBC", "IRON",  "RETICCTPCT" in the last 72 hours. Urinalysis    Component Value Date/Time   COLORURINE YELLOW 08/03/2022 [place] 08/03/2022 0529   LABSPEC 1.015 08/03/2022 0529  PHURINE 5.0 08/03/2022 0529   GLUCOSEU NEGATIVE 08/03/2022 0529   HGBUR NEGATIVE 08/03/2022 0529   BILIRUBINUR NEGATIVE 08/03/2022 0529   KETONESUR NEGATIVE 08/03/2022 0529   PROTEINUR 30 (A) 08/03/2022 0529   NITRITE NEGATIVE 08/03/2022 0529   LEUKOCYTESUR NEGATIVE 08/03/2022 0529   Sepsis Labs Recent Labs  Lab 08/02/22 1900 08/03/22 0350 08/04/22 0529  WBC 6.1 7.1 6.2   Microbiology Recent Results (from the past 240 hour(s))  Blood Culture (routine x 2)     Status: None (Preliminary result)   Collection Time: 08/02/22  7:02 PM   Specimen: BLOOD  Result Value Ref Range Status   Specimen Description   Final    BLOOD LEFT ANTECUBITAL Performed at [facility], [place] [address] [place], [place] [address]    Special Requests   Final    BOTTLES DRAWN AEROBIC AND ANAEROBIC Blood Culture adequate volume Performed at [facility] [address] [place], [place] [address]    Culture   Final    NO GROWTH 2 DAYS Performed at [facility] [address], [place], [place] [address]    Report Status PENDING  Incomplete  Blood Culture (routine x 2)     Status: None (Preliminary result)   Collection Time: 08/02/22  7:19 PM   Specimen: BLOOD  Result Value Ref Range Status   Specimen Description   Final    BLOOD BLOOD LEFT FOREARM Performed at [facility] [address], [place], [place] [address]    Special Requests   Final    BOTTLES DRAWN AEROBIC AND ANAEROBIC Blood Culture adequate volume Performed at [facility] [address] [place], [place] [address]    Culture   Final    NO GROWTH 2 DAYS Performed at [facility] [address], [place], [place] [address]    Report Status PENDING  Incomplete   Resp panel by RT-PCR (RSV, Flu A&B, Covid) Anterior Nasal Swab     Status: Abnormal   Collection Time: 08/02/22  7:27 PM   Specimen: Anterior Nasal Swab  Result Value Ref Range Status   SARS Coronavirus 2 by RT PCR POSITIVE (A) NEGATIVE Final    Comment: (NOTE) SARS-CoV-2 target nucleic acids are DETECTED.  The SARS-CoV-2 RNA is generally detectable in upper respiratory specimens during the acute phase of infection. Positive results are indicative of the presence of the identified virus, but do not rule out bacterial infection or co-infection with other pathogens not detected by the test. Clinical correlation with patient history and other diagnostic information is necessary to determine patient infection status. The expected result is Negative.  Fact Sheet for Patients: EntrepreneurPulse.com.au  Fact Sheet for Healthcare Providers: IncredibleEmployment.be  This test is not yet approved or cleared by the [place] FDA and  has been authorized for detection and/or diagnosis of SARS-CoV-2 by FDA under an Emergency Use Authorization (EUA).  This EUA will remain in effect (meaning this test can be used) for the duration of  the COVID-19 declaration under Section 564(b)(1) of the A ct, 21 U.S.C. section 360bbb-3(b)(1), unless the authorization is terminated or revoked sooner.     Influenza A by PCR POSITIVE (A) NEGATIVE Final   Influenza B by PCR NEGATIVE NEGATIVE Final    Comment: (NOTE) The Xpert Xpress SARS-CoV-2/FLU/RSV plus assay is intended as an aid in the diagnosis of influenza from Nasopharyngeal swab specimens and should not be used as a sole basis for treatment.  Nasal washings and aspirates are unacceptable for Xpert Xpress SARS-CoV-2/FLU/RSV testing.  Fact Sheet for Patients: BloggerCourse.com  Fact Sheet for Healthcare Providers: SeriousBroker.it  This test is not yet  approved or cleared by the [place] FDA and has been authorized for detection and/or diagnosis of SARS-CoV-2 by FDA under an Emergency Use Authorization (EUA). This EUA will remain in effect (meaning this test can be used) for the duration of the COVID-19 declaration under Section 564(b)(1) of the Act, 21 U.S.C. section 360bbb-3(b)(1), unless the authorization is terminated or revoked.     Resp Syncytial Virus by PCR NEGATIVE NEGATIVE Final    Comment: (NOTE) Fact Sheet for Patients: BloggerCourse.com  Fact Sheet for Healthcare Providers: SeriousBroker.it  This test is not yet approved or cleared by the [place] FDA and has been authorized for detection and/or diagnosis of SARS-CoV-2 by FDA under an Emergency Use Authorization (EUA). This EUA will remain in effect (meaning this test can be used) for the duration of the COVID-19 declaration under Section 564(b)(1) of the Act, 21 U.S.C. section 360bbb-3(b)(1), unless the authorization is terminated or revoked.  Performed at [facility], [address] [place], [place] [address]      Time coordinating discharge:  35 minutes  SIGNED:   Dorcas Carrow, MD  [facility] 08/04/2022, 10:29 AM

## 2022-08-07 LAB — CULTURE, BLOOD (ROUTINE X 2)
Culture: NO GROWTH
Culture: NO GROWTH
Special Requests: ADEQUATE
Special Requests: ADEQUATE

## 2022-08-22 ENCOUNTER — Ambulatory Visit: Payer: Medicare Other | Attending: Internal Medicine

## 2022-08-22 DIAGNOSIS — R55 Syncope and collapse: Secondary | ICD-10-CM

## 2022-08-24 LAB — CUP PACEART REMOTE DEVICE CHECK
Date Time Interrogation Session: 20240119230526
Implantable Pulse Generator Implant Date: 20211027

## 2022-08-25 NOTE — Progress Notes (Signed)
[facility] Summary Report / Loop Recorder

## 2022-09-22 ENCOUNTER — Ambulatory Visit (INDEPENDENT_AMBULATORY_CARE_PROVIDER_SITE_OTHER): Payer: Medicare Other

## 2022-09-22 DIAGNOSIS — R55 Syncope and collapse: Secondary | ICD-10-CM | POA: Diagnosis not present

## 2022-09-23 LAB — CUP PACEART REMOTE DEVICE CHECK
Date Time Interrogation Session: 20240221231407
Implantable Pulse Generator Implant Date: 20211027

## 2022-10-10 NOTE — Progress Notes (Signed)
Carelink Summary Report / Loop Recorder 

## 2022-10-17 NOTE — Progress Notes (Signed)
Carelink Summary Report / Loop Recorder 

## 2022-10-25 ENCOUNTER — Ambulatory Visit (INDEPENDENT_AMBULATORY_CARE_PROVIDER_SITE_OTHER): Payer: Medicare Other

## 2022-10-25 DIAGNOSIS — R55 Syncope and collapse: Secondary | ICD-10-CM

## 2022-10-25 LAB — CUP PACEART REMOTE DEVICE CHECK
Date Time Interrogation Session: 20240325231019
Implantable Pulse Generator Implant Date: 20211027

## 2022-11-09 ENCOUNTER — Other Ambulatory Visit: Payer: Self-pay

## 2022-11-09 DIAGNOSIS — I7143 Infrarenal abdominal aortic aneurysm, without rupture: Secondary | ICD-10-CM

## 2022-11-18 ENCOUNTER — Ambulatory Visit: Payer: Medicare Other | Admitting: Vascular Surgery

## 2022-11-18 ENCOUNTER — Ambulatory Visit (HOSPITAL_COMMUNITY): Payer: Medicare Other

## 2022-11-28 ENCOUNTER — Ambulatory Visit (INDEPENDENT_AMBULATORY_CARE_PROVIDER_SITE_OTHER): Payer: Medicare Other

## 2022-11-28 DIAGNOSIS — R55 Syncope and collapse: Secondary | ICD-10-CM | POA: Diagnosis not present

## 2022-11-28 LAB — CUP PACEART REMOTE DEVICE CHECK
Date Time Interrogation Session: 20240427230334
Implantable Pulse Generator Implant Date: 20211027

## 2022-11-30 ENCOUNTER — Ambulatory Visit (HOSPITAL_COMMUNITY)
Admission: RE | Admit: 2022-11-30 | Discharge: 2022-11-30 | Disposition: A | Discharge status: Home / Self Care | Payer: Medicare Other | Source: Ambulatory Visit | Attending: Vascular Surgery | Admitting: Vascular Surgery

## 2022-11-30 DIAGNOSIS — I7143 Infrarenal abdominal aortic aneurysm, without rupture: Secondary | ICD-10-CM | POA: Diagnosis present

## 2022-12-06 NOTE — Progress Notes (Signed)
Carelink Summary Report / Loop Recorder 

## 2022-12-07 ENCOUNTER — Ambulatory Visit (INDEPENDENT_AMBULATORY_CARE_PROVIDER_SITE_OTHER): Payer: Medicare Other | Admitting: Vascular Surgery

## 2022-12-07 ENCOUNTER — Encounter: Payer: Self-pay | Admitting: Vascular Surgery

## 2022-12-07 VITALS — BP 141/78 | HR 54 | Temp 97.8°F | Resp 20 | Ht 74.0 in

## 2022-12-07 DIAGNOSIS — I7143 Infrarenal abdominal aortic aneurysm, without rupture: Secondary | ICD-10-CM | POA: Diagnosis not present

## 2022-12-07 NOTE — Progress Notes (Signed)
Patient ID: Steven Kent, male   DOB: 07-Oct-1934, 88 y.o.   MRN: [id number]  Reason for Consult: Follow-up   Referred by Lacinda Axon  Subjective:     HPI:  Steven Kent is a 88 y.o. male with history of abdominal aortic aneurysm previously followed in our office by Dr. Arbie Cookey.  Denies abdominal or back pain.  Does have a history of hypertension.  No family history of aneurysm disease.  Patient remains on Xarelto.  He is accompanied by caregiver today from the nursing home.  She states that he is max assist at this time.  He had a stroke that previously affected his right arm and leg and he has a small sore on his left ankle but he would not allow me to evaluate today.  Past Medical History:  Diagnosis Date   Abdominal aortic aneurysm (AAA) (HCC)    Hypertension    Stroke [facility])    Family History  Problem Relation Age of Onset   Prostate cancer Neg Hx    Past Surgical History:  Procedure Laterality Date   CATARACT EXTRACTION, BILATERAL     rt eye 10/09/2014 lt eye 11/06/2014   DENTAL SURGERY     REFRACTIVE SURGERY  01/2015    Short Social History:  Social History   Tobacco Use   Smoking status: Former   Smokeless tobacco: Never  Substance Use Topics   Alcohol use: Never    Allergies  Allergen Reactions   Aspirin Other (See Comments)    Full Strength Asprin-325 mg causes upset stomach. Can take 81 mg. "ALLERGIC," per Mar    Current Outpatient Medications  Medication Sig Dispense Refill   atorvastatin (LIPITOR) 40 MG tablet TAKE 1 TABLET(40 MG) BY MOUTH EVERY EVENING (Patient taking differently: Take 40 mg by mouth at bedtime.) 90 tablet 3   baclofen (LIORESAL) 10 MG tablet Take 10 mg by mouth 2 (two) times daily.     brimonidine (ALPHAGAN) 0.15 % ophthalmic solution Place 1 drop into both eyes daily.     chlorhexidine (PERIDEX) 0.12 % solution 10 mLs by Mouth Rinse route See admin instructions. Swish 10 ml's as directed in the mouth on Mon/Wed/Fri- rinse and  spit     donepezil (ARICEPT) 10 MG tablet Take 10 mg by mouth at bedtime.     dorzolamide (TRUSOPT) 2 % ophthalmic solution Place 1 drop into both eyes 2 (two) times daily.     gabapentin (NEURONTIN) 800 MG tablet Take 800 mg by mouth 3 (three) times daily.     latanoprost (XALATAN) 0.005 % ophthalmic solution Place 1 drop into both eyes at bedtime.     lidocaine 4 % Place 1 patch onto the skin See admin instructions. Apply 1 patch to 3 sites (right arm, right forearm, and right lateral ankle) every morning and remove per schedule     losartan (COZAAR) 25 MG tablet Take 12.5 mg by mouth in the morning.     melatonin 5 MG TABS Take 5 mg by mouth at bedtime.     mineral oil-hydrophilic petrolatum (AQUAPHOR) ointment Apply topically as needed for dry skin (bilateral legs and feet).     oxybutynin (DITROPAN-XL) 5 MG 24 hr tablet Take 5 mg by mouth at bedtime.     torsemide (DEMADEX) 20 MG tablet Take 20 mg by mouth daily.     TYLENOL 500 MG tablet Take 1,000 mg by mouth 2 (two) times daily.     Vitamin D, Ergocalciferol, (DRISDOL) 1.25 MG (50000  UNIT) CAPS capsule TAKE 1 CAPSULE BY MOUTH EVERY 7 DAYS (Patient taking differently: Take 50,000 Units by mouth every Sunday.) 12 capsule 3   XARELTO 20 MG TABS tablet TAKE 1 TABLET(20 MG) BY MOUTH EVERY EVENING (Patient taking differently: Take 20 mg by mouth at bedtime.) 90 tablet 3   No current facility-administered medications for this visit.    Review of Systems  Constitutional:  Constitutional negative. HENT: HENT negative.  Eyes: Eyes negative.  Respiratory: Respiratory negative.  Cardiovascular: Positive for leg swelling.  GI: Gastrointestinal negative.  Musculoskeletal: Musculoskeletal negative.  Skin: Positive for wound.  Neurological: Positive for focal weakness.  Hematologic: Hematologic/lymphatic negative.  Psychiatric: Psychiatric negative.        Objective:  Objective   Vitals:   12/07/22 0819  BP: (!) 141/78  Pulse: (!) 54   Resp: 20  Temp: 97.8 F (36.6 C)  SpO2: 94%     Physical Exam HENT:     Head: Normocephalic.     Nose: Nose normal.  Eyes:     Pupils: Pupils are equal, round, and reactive to light.  Cardiovascular:     Rate and Rhythm: Normal rate.  Pulmonary:     Effort: Pulmonary effort is normal.  Abdominal:     General: Abdomen is flat.     Palpations: Abdomen is soft. There is no mass.  Musculoskeletal:        General: Normal range of motion.     Cervical back: Normal range of motion and neck supple.     Right lower leg: Edema present.  Skin:    General: Skin is dry.     Capillary Refill: Capillary refill takes less than 2 seconds.  Neurological:     General: No focal deficit present.     Mental Status: He is alert.  Psychiatric:        Mood and Affect: Mood normal.        Thought Content: Thought content normal.        Judgment: Judgment normal.     Data: Abdominal Aorta Findings:  +-------------+-------+----------+----------+----------+--------+--------+  Location    AP (cm)Trans (cm)PSV (cm/s)Waveform  ThrombusComments  +-------------+-------+----------+----------+----------+--------+--------+  Proximal    2.16             108       biphasic                    +-------------+-------+----------+----------+----------+--------+--------+  Mid         3.33   2.98      94        monophasic                  +-------------+-------+----------+----------+----------+--------+--------+  Distal      4.54   5.04      31        monophasic                  +-------------+-------+----------+----------+----------+--------+--------+  RT CIA Prox  1.6    1.8       57        biphasic                    +-------------+-------+----------+----------+----------+--------+--------+  RT EIA Distal0.9    0.8       72         biphasic                    +-------------+-------+----------+----------+----------+--------+--------+  LT CIA Prox  1.7  1.8       73        biphasic                    +-------------+-------+----------+----------+----------+--------+--------+  LT CIA Distal1.2    1.1       93        biphasic                    +-------------+-------+----------+----------+----------+--------+--------+   Visualization of the Proximal Abdominal Aorta was limited.       Summary:  Abdominal Aorta: There is evidence of abnormal dilatation of the distal  and mid Abdominal aorta. The largest aortic diameter remains essentially  unchanged compared to prior exam. Previous diameter measurement was  obtained on 10/30/2020.      Assessment/Plan:     88 year old male with 5 cm abdominal aortic aneurysm that is only minimally changed in the past year and a half.  Patient would not be a good candidate for surgery and I think at this point he should follow-up on an as-needed basis.  He would benefit from compression stockings at least to the right lower extremity but patient very unwilling to allow examination of the right leg and foot and his caregiver states that this is his usual state of complaint.  Patient does demonstrate good understanding of our aneurysm discussion and I will see him  prn.     Maeola Harman MD [facility]

## 2022-12-30 NOTE — Progress Notes (Signed)
Carelink Summary Report / Loop Recorder 

## 2023-01-02 ENCOUNTER — Ambulatory Visit (INDEPENDENT_AMBULATORY_CARE_PROVIDER_SITE_OTHER): Payer: Medicare Other

## 2023-01-02 DIAGNOSIS — R55 Syncope and collapse: Secondary | ICD-10-CM

## 2023-01-02 LAB — CUP PACEART REMOTE DEVICE CHECK
Date Time Interrogation Session: 20240602230824
Implantable Pulse Generator Implant Date: 20211027

## 2023-01-24 NOTE — Progress Notes (Signed)
Carelink Summary Report / Loop Recorder 

## 2023-02-06 ENCOUNTER — Ambulatory Visit (INDEPENDENT_AMBULATORY_CARE_PROVIDER_SITE_OTHER): Payer: Medicare Other

## 2023-02-06 DIAGNOSIS — R55 Syncope and collapse: Secondary | ICD-10-CM

## 2023-02-06 LAB — CUP PACEART REMOTE DEVICE CHECK
Date Time Interrogation Session: 20240705230526
Implantable Pulse Generator Implant Date: 20211027

## 2023-02-23 NOTE — Progress Notes (Signed)
Carelink Summary Report / Loop Recorder 

## 2023-03-13 ENCOUNTER — Ambulatory Visit: Payer: Medicare Other

## 2023-03-13 DIAGNOSIS — R55 Syncope and collapse: Secondary | ICD-10-CM

## 2023-03-23 ENCOUNTER — Other Ambulatory Visit: Payer: Self-pay | Admitting: *Deleted

## 2023-03-23 DIAGNOSIS — R600 Localized edema: Secondary | ICD-10-CM

## 2023-03-27 ENCOUNTER — Ambulatory Visit (HOSPITAL_COMMUNITY)
Admission: RE | Admit: 2023-03-27 | Discharge: 2023-03-27 | Disposition: A | Discharge status: Home / Self Care | Payer: Medicare Other | Source: Ambulatory Visit | Attending: Surgery | Admitting: Surgery

## 2023-03-27 DIAGNOSIS — R600 Localized edema: Secondary | ICD-10-CM | POA: Insufficient documentation

## 2023-03-27 LAB — VAS US ABI WITH/WO TBI
Left ABI: 1.18
Right ABI: 1.14

## 2023-03-28 NOTE — Progress Notes (Signed)
Carelink Summary Report / Loop Recorder 

## 2023-04-05 ENCOUNTER — Ambulatory Visit (INDEPENDENT_AMBULATORY_CARE_PROVIDER_SITE_OTHER): Payer: Medicare Other | Admitting: Vascular Surgery

## 2023-04-05 ENCOUNTER — Encounter: Payer: Self-pay | Admitting: Vascular Surgery

## 2023-04-05 VITALS — BP 152/80 | HR 51 | Temp 97.9°F | Resp 20 | Ht 74.0 in | Wt 180.0 lb

## 2023-04-05 DIAGNOSIS — R600 Localized edema: Secondary | ICD-10-CM

## 2023-04-05 DIAGNOSIS — I7143 Infrarenal abdominal aortic aneurysm, without rupture: Secondary | ICD-10-CM

## 2023-04-05 NOTE — Progress Notes (Signed)
Patient ID: Steven Kent, male   DOB: January 03, 1935, 88 y.o.   MRN: [id number]  Reason for Consult: Follow-up   Referred by Karna Dupes, MD  Subjective:     HPI:  Steven Kent is a 88 y.o. male I have seen for abdominal aortic aneurysm as well as lower extremity disease.  He is assisted by caregiver today states he continues to take Xarelto.  Patient with history of stroke he is weak on the right side both upper and lower extremity.  He is full max assist mostly in a chair during the day.  No tissue loss or ulceration at this time.  Past Medical History:  Diagnosis Date   Abdominal aortic aneurysm (AAA) (HCC)    Hypertension    Stroke [facility])    Family History  Problem Relation Age of Onset   Prostate cancer Neg Hx    Past Surgical History:  Procedure Laterality Date   CATARACT EXTRACTION, BILATERAL     rt eye 10/09/2014 lt eye 11/06/2014   DENTAL SURGERY     REFRACTIVE SURGERY  01/2015    Short Social History:  Social History   Tobacco Use   Smoking status: Former   Smokeless tobacco: Never  Substance Use Topics   Alcohol use: Never    Allergies  Allergen Reactions   Aspirin Other (See Comments)    Full Strength Asprin-325 mg causes upset stomach. Can take 81 mg. "ALLERGIC," per Mar    Current Outpatient Medications  Medication Sig Dispense Refill   atorvastatin (LIPITOR) 40 MG tablet TAKE 1 TABLET(40 MG) BY MOUTH EVERY EVENING (Patient taking differently: Take 40 mg by mouth at bedtime.) 90 tablet 3   baclofen (LIORESAL) 10 MG tablet Take 10 mg by mouth 2 (two) times daily.     brimonidine (ALPHAGAN) 0.15 % ophthalmic solution Place 1 drop into both eyes daily.     chlorhexidine (PERIDEX) 0.12 % solution 10 mLs by Mouth Rinse route See admin instructions. Swish 10 ml's as directed in the mouth on Mon/Wed/Fri- rinse and spit     donepezil (ARICEPT) 10 MG tablet Take 10 mg by mouth at bedtime.     dorzolamide (TRUSOPT) 2 % ophthalmic solution Place 1  drop into both eyes 2 (two) times daily.     gabapentin (NEURONTIN) 800 MG tablet Take 800 mg by mouth 3 (three) times daily.     latanoprost (XALATAN) 0.005 % ophthalmic solution Place 1 drop into both eyes at bedtime.     lidocaine 4 % Place 1 patch onto the skin See admin instructions. Apply 1 patch to 3 sites (right arm, right forearm, and right lateral ankle) every morning and remove per schedule     losartan (COZAAR) 25 MG tablet Take 12.5 mg by mouth in the morning.     melatonin 5 MG TABS Take 5 mg by mouth at bedtime.     mineral oil-hydrophilic petrolatum (AQUAPHOR) ointment Apply topically as needed for dry skin (bilateral legs and feet).     oxybutynin (DITROPAN-XL) 5 MG 24 hr tablet Take 5 mg by mouth at bedtime.     torsemide (DEMADEX) 20 MG tablet Take 20 mg by mouth daily.     TYLENOL 500 MG tablet Take 1,000 mg by mouth 2 (two) times daily.     Vitamin D, Ergocalciferol, (DRISDOL) 1.25 MG (50000 UNIT) CAPS capsule TAKE 1 CAPSULE BY MOUTH EVERY 7 DAYS (Patient taking differently: Take 50,000 Units by mouth every Sunday.) 12 capsule 3  XARELTO 20 MG TABS tablet TAKE 1 TABLET(20 MG) BY MOUTH EVERY EVENING (Patient taking differently: Take 20 mg by mouth at bedtime.) 90 tablet 3   No current facility-administered medications for this visit.    Review of Systems  Constitutional:  Constitutional negative. HENT: HENT negative.  Eyes: Eyes negative.  Respiratory: Respiratory negative.  Cardiovascular: Positive for leg swelling.  GI: Gastrointestinal negative.  Musculoskeletal: Musculoskeletal negative.  Skin: Skin negative.  Neurological: Positive for focal weakness.  Hematologic: Hematologic/lymphatic negative.  Psychiatric: Psychiatric negative.        Objective:  Objective   Vitals:   04/05/23 1433  BP: (!) 152/80  Pulse: (!) 51  Resp: 20  Temp: 97.9 F (36.6 C)  SpO2: 90%  Weight: 180 lb (81.6 kg)  Height: 6\' 2"  (1.88 m)   Body mass index is 23.11  kg/m.  Physical Exam HENT:     Mouth/Throat:     Mouth: Mucous membranes are moist.  Eyes:     Pupils: Pupils are equal, round, and reactive to light.  Cardiovascular:     Rate and Rhythm: Normal rate.  Pulmonary:     Effort: Pulmonary effort is normal.  Abdominal:     General: Abdomen is flat.     Palpations: Abdomen is soft. There is no mass.  Musculoskeletal:        General: Swelling present.     Right lower leg: Edema present.     Left lower leg: Edema present.  Skin:    Comments: Dark discoloration both ankles  Neurological:     Mental Status: He is alert. Mental status is at baseline.     Motor: Weakness present.  Psychiatric:        Mood and Affect: Mood normal.     Data: ABI Findings:  +---------+------------------+-----+----------+--------+  Right   Rt Pressure (mmHg)IndexWaveform  Comment   +---------+------------------+-----+----------+--------+  Brachial                                  stroke    +---------+------------------+-----+----------+--------+  PTA     73                0.53 monophasic          +---------+------------------+-----+----------+--------+  DP      156               1.14 monophasic          +---------+------------------+-----+----------+--------+  Great Toe119               0.87                     +---------+------------------+-----+----------+--------+   +---------+------------------+-----+----------+-------+  Left    Lt Pressure (mmHg)IndexWaveform  Comment  +---------+------------------+-----+----------+-------+  Brachial 137                                       +---------+------------------+-----+----------+-------+  PTA     80                0.58 monophasic         +---------+------------------+-----+----------+-------+  DP      162               1.18 monophasic         +---------+------------------+-----+----------+-------+  Lamont Dowdy  0.61                     +---------+------------------+-----+----------+-------+   +-------+-----------+-----------+------------+------------+  ABI/TBIToday's ABIToday's TBIPrevious ABIPrevious TBI  +-------+-----------+-----------+------------+------------+  Right 1.14       0.87                                 +-------+-----------+-----------+------------+------------+  Left  1.18       0.61                                 +-------+-----------+-----------+------------+------------+        No previous ABI.  Pedal pressures may be falsely elevated due to medial calcification.    Summary:  Right: Resting right ankle-brachial index is within normal range. The  right toe-brachial index is normal.   Left: Resting left ankle-brachial index is within normal range. The left  toe-brachial index is abnormal.       Assessment/Plan:    88 year old male with bilateral lower extremity swelling and abdominal aortic aneurysm.  He is max assist and confined to a wheelchair from history of stroke and would be exceedingly high risk for any intervention.  As such patient does not need any further vascular evaluation.  I have recommended compression as tolerated and elevation of his legs is much as possible.  He and his caregiver demonstrate good understanding.     Maeola Harman MD [facility]

## 2023-04-17 ENCOUNTER — Ambulatory Visit (INDEPENDENT_AMBULATORY_CARE_PROVIDER_SITE_OTHER): Payer: Medicare Other

## 2023-04-17 DIAGNOSIS — R55 Syncope and collapse: Secondary | ICD-10-CM

## 2023-04-18 LAB — CUP PACEART REMOTE DEVICE CHECK
Date Time Interrogation Session: 20240913230536
Implantable Pulse Generator Implant Date: 20211027

## 2023-05-03 NOTE — Progress Notes (Signed)
Carelink Summary Report / Loop Recorder 

## 2023-05-22 ENCOUNTER — Ambulatory Visit: Payer: Medicare Other

## 2023-05-22 DIAGNOSIS — R55 Syncope and collapse: Secondary | ICD-10-CM

## 2023-05-22 LAB — CUP PACEART REMOTE DEVICE CHECK
Date Time Interrogation Session: 20241020230433
Implantable Pulse Generator Implant Date: 20211027

## 2023-06-08 NOTE — Progress Notes (Signed)
Carelink Summary Report / Loop Recorder 

## 2023-06-26 ENCOUNTER — Ambulatory Visit (INDEPENDENT_AMBULATORY_CARE_PROVIDER_SITE_OTHER): Payer: Medicare Other

## 2023-06-26 DIAGNOSIS — R55 Syncope and collapse: Secondary | ICD-10-CM

## 2023-06-26 LAB — CUP PACEART REMOTE DEVICE CHECK
Date Time Interrogation Session: 20241122230553
Implantable Pulse Generator Implant Date: 20211027

## 2023-07-17 ENCOUNTER — Telehealth: Payer: Self-pay

## 2023-07-17 NOTE — Telephone Encounter (Signed)
Alert received from [employer] for Device alert for pause. Event occurred 12/14 @ 17:46, 3sec in duration, ventricular ectopy prior to pause.  Attempted to call patient. No answer, LMTCB.

## 2023-07-18 NOTE — Telephone Encounter (Signed)
Called and spoke w/ staff member. She asked pt if he was symptomatic during time of event and pt does not report anything abnormal.

## 2023-07-24 NOTE — Progress Notes (Signed)
Carelink Summary Report / Loop Recorder 

## 2023-07-31 ENCOUNTER — Ambulatory Visit (INDEPENDENT_AMBULATORY_CARE_PROVIDER_SITE_OTHER): Payer: Medicare Other

## 2023-07-31 DIAGNOSIS — R55 Syncope and collapse: Secondary | ICD-10-CM

## 2023-07-31 LAB — CUP PACEART REMOTE DEVICE CHECK
Date Time Interrogation Session: 20241227230413
Implantable Pulse Generator Implant Date: 20211027

## 2023-09-04 ENCOUNTER — Ambulatory Visit (INDEPENDENT_AMBULATORY_CARE_PROVIDER_SITE_OTHER): Payer: Medicare Other

## 2023-09-04 DIAGNOSIS — R55 Syncope and collapse: Secondary | ICD-10-CM

## 2023-09-04 LAB — CUP PACEART REMOTE DEVICE CHECK
Date Time Interrogation Session: 20250131230500
Implantable Pulse Generator Implant Date: 20211027

## 2023-10-09 ENCOUNTER — Ambulatory Visit (INDEPENDENT_AMBULATORY_CARE_PROVIDER_SITE_OTHER): Payer: Medicare Other

## 2023-10-09 DIAGNOSIS — R55 Syncope and collapse: Secondary | ICD-10-CM

## 2023-10-09 LAB — CUP PACEART REMOTE DEVICE CHECK
Date Time Interrogation Session: 20250309230430
Implantable Pulse Generator Implant Date: 20211027

## 2023-10-11 NOTE — Progress Notes (Signed)
 [facility] Summary Report / Loop Recorder

## 2023-11-13 ENCOUNTER — Ambulatory Visit (INDEPENDENT_AMBULATORY_CARE_PROVIDER_SITE_OTHER): Payer: Medicare Other

## 2023-11-13 DIAGNOSIS — R55 Syncope and collapse: Secondary | ICD-10-CM | POA: Diagnosis not present

## 2023-11-13 LAB — CUP PACEART REMOTE DEVICE CHECK
Date Time Interrogation Session: 20250413230445
Implantable Pulse Generator Implant Date: 20211027

## 2023-11-20 NOTE — Progress Notes (Signed)
 [facility] Summary Report / Loop Recorder

## 2023-12-09 ENCOUNTER — Emergency Department (HOSPITAL_COMMUNITY)

## 2023-12-09 ENCOUNTER — Inpatient Hospital Stay (HOSPITAL_COMMUNITY)
Admission: EM | Admit: 2023-12-09 | Discharge: 2023-12-12 | DRG: 177 | Disposition: A | Discharge status: Skilled Nursing Facility | Source: Skilled Nursing Facility | Attending: Internal Medicine | Admitting: Internal Medicine

## 2023-12-09 ENCOUNTER — Other Ambulatory Visit: Payer: Self-pay

## 2023-12-09 ENCOUNTER — Encounter (HOSPITAL_COMMUNITY): Payer: Self-pay | Admitting: Family Medicine

## 2023-12-09 DIAGNOSIS — I714 Abdominal aortic aneurysm, without rupture, unspecified: Secondary | ICD-10-CM | POA: Diagnosis present

## 2023-12-09 DIAGNOSIS — Z7901 Long term (current) use of anticoagulants: Secondary | ICD-10-CM

## 2023-12-09 DIAGNOSIS — E876 Hypokalemia: Secondary | ICD-10-CM | POA: Diagnosis not present

## 2023-12-09 DIAGNOSIS — Z9842 Cataract extraction status, left eye: Secondary | ICD-10-CM

## 2023-12-09 DIAGNOSIS — I2510 Atherosclerotic heart disease of native coronary artery without angina pectoris: Secondary | ICD-10-CM | POA: Diagnosis present

## 2023-12-09 DIAGNOSIS — I480 Paroxysmal atrial fibrillation: Secondary | ICD-10-CM | POA: Diagnosis not present

## 2023-12-09 DIAGNOSIS — I10 Essential (primary) hypertension: Secondary | ICD-10-CM | POA: Diagnosis present

## 2023-12-09 DIAGNOSIS — J690 Pneumonitis due to inhalation of food and vomit: Principal | ICD-10-CM | POA: Diagnosis present

## 2023-12-09 DIAGNOSIS — N179 Acute kidney failure, unspecified: Principal | ICD-10-CM | POA: Diagnosis present

## 2023-12-09 DIAGNOSIS — R4189 Other symptoms and signs involving cognitive functions and awareness: Secondary | ICD-10-CM | POA: Diagnosis present

## 2023-12-09 DIAGNOSIS — I69351 Hemiplegia and hemiparesis following cerebral infarction affecting right dominant side: Secondary | ICD-10-CM | POA: Diagnosis not present

## 2023-12-09 DIAGNOSIS — G9340 Encephalopathy, unspecified: Secondary | ICD-10-CM | POA: Diagnosis not present

## 2023-12-09 DIAGNOSIS — Z87891 Personal history of nicotine dependence: Secondary | ICD-10-CM

## 2023-12-09 DIAGNOSIS — G9341 Metabolic encephalopathy: Secondary | ICD-10-CM | POA: Diagnosis present

## 2023-12-09 DIAGNOSIS — E860 Dehydration: Secondary | ICD-10-CM | POA: Diagnosis present

## 2023-12-09 DIAGNOSIS — Z9841 Cataract extraction status, right eye: Secondary | ICD-10-CM

## 2023-12-09 DIAGNOSIS — Z79899 Other long term (current) drug therapy: Secondary | ICD-10-CM

## 2023-12-09 DIAGNOSIS — Z886 Allergy status to analgesic agent status: Secondary | ICD-10-CM

## 2023-12-09 DIAGNOSIS — F0390 Unspecified dementia without behavioral disturbance: Secondary | ICD-10-CM | POA: Diagnosis present

## 2023-12-09 LAB — ACETAMINOPHEN LEVEL: Acetaminophen (Tylenol), Serum: 10 ug/mL (ref 10–30)

## 2023-12-09 LAB — CBC WITH DIFFERENTIAL/PLATELET
Abs Immature Granulocytes: 0.03 10*3/uL (ref 0.00–0.07)
Basophils Absolute: 0 10*3/uL (ref 0.0–0.1)
Basophils Relative: 0 %
Eosinophils Absolute: 0.3 10*3/uL (ref 0.0–0.5)
Eosinophils Relative: 3 %
HCT: 46.7 % (ref 39.0–52.0)
Hemoglobin: 14.9 g/dL (ref 13.0–17.0)
Immature Granulocytes: 0 %
Lymphocytes Relative: 24 %
Lymphs Abs: 2 10*3/uL (ref 0.7–4.0)
MCH: 32.1 pg (ref 26.0–34.0)
MCHC: 31.9 g/dL (ref 30.0–36.0)
MCV: 100.6 fL — ABNORMAL HIGH (ref 80.0–100.0)
Monocytes Absolute: 0.7 10*3/uL (ref 0.1–1.0)
Monocytes Relative: 8 %
Neutro Abs: 5.4 10*3/uL (ref 1.7–7.7)
Neutrophils Relative %: 65 %
Platelets: 190 10*3/uL (ref 150–400)
RBC: 4.64 MIL/uL (ref 4.22–5.81)
RDW: 14.2 % (ref 11.5–15.5)
WBC: 8.4 10*3/uL (ref 4.0–10.5)
nRBC: 0 % (ref 0.0–0.2)

## 2023-12-09 LAB — URINALYSIS, W/ REFLEX TO CULTURE (INFECTION SUSPECTED)
Bacteria, UA: NONE SEEN
Bilirubin Urine: NEGATIVE
Glucose, UA: NEGATIVE mg/dL
Hgb urine dipstick: NEGATIVE
Ketones, ur: NEGATIVE mg/dL
Leukocytes,Ua: NEGATIVE
Nitrite: NEGATIVE
Protein, ur: NEGATIVE mg/dL
Specific Gravity, Urine: 1.012 (ref 1.005–1.030)
pH: 5 (ref 5.0–8.0)

## 2023-12-09 LAB — COMPREHENSIVE METABOLIC PANEL WITH GFR
ALT: 9 U/L (ref 0–44)
AST: 20 U/L (ref 15–41)
Albumin: 3.8 g/dL (ref 3.5–5.0)
Alkaline Phosphatase: 57 U/L (ref 38–126)
Anion gap: 13 (ref 5–15)
BUN: 45 mg/dL — ABNORMAL HIGH (ref 8–23)
CO2: 30 mmol/L (ref 22–32)
Calcium: 9.2 mg/dL (ref 8.9–10.3)
Chloride: 102 mmol/L (ref 98–111)
Creatinine, Ser: 1.67 mg/dL — ABNORMAL HIGH (ref 0.61–1.24)
GFR, Estimated: 40 mL/min — ABNORMAL LOW (ref >60)
Glucose, Bld: 115 mg/dL — ABNORMAL HIGH (ref 70–99)
Potassium: 3.4 mmol/L — ABNORMAL LOW (ref 3.5–5.1)
Sodium: 145 mmol/L (ref 135–145)
Total Bilirubin: 1.4 mg/dL — ABNORMAL HIGH (ref 0.0–1.2)
Total Protein: 8.4 g/dL — ABNORMAL HIGH (ref 6.5–8.1)

## 2023-12-09 LAB — ETHANOL: Alcohol, Ethyl (B): <15 mg/dL (ref <15)

## 2023-12-09 LAB — AMMONIA: Ammonia: <13 umol/L (ref 9–35)

## 2023-12-09 LAB — MAGNESIUM: Magnesium: 2.5 mg/dL — ABNORMAL HIGH (ref 1.7–2.4)

## 2023-12-09 LAB — SALICYLATE LEVEL: Salicylate Lvl: <7 mg/dL — ABNORMAL LOW (ref 7.0–30.0)

## 2023-12-09 MED ORDER — SODIUM CHLORIDE 0.9 % IV BOLUS
1000.0000 mL | Freq: Once | INTRAVENOUS | Status: AC
  Administered 2023-12-09: 1000 mL via INTRAVENOUS

## 2023-12-09 MED ORDER — DORZOLAMIDE HCL-TIMOLOL MAL 2-0.5 % OP SOLN
1.0000 [drp] | Freq: Two times a day (BID) | OPHTHALMIC | Status: DC
  Administered 2023-12-10 – 2023-12-12 (×6): 1 [drp] via OPHTHALMIC
  Filled 2023-12-09: qty 10

## 2023-12-09 MED ORDER — GABAPENTIN 800 MG PO TABS: 800.0000 mg | ORAL_TABLET | Freq: Three times a day (TID) | ORAL | Status: DC

## 2023-12-09 MED ORDER — ACETAMINOPHEN 650 MG RE SUPP: 650.0000 mg | Freq: Four times a day (QID) | RECTAL | Status: DC | PRN

## 2023-12-09 MED ORDER — ATORVASTATIN CALCIUM 40 MG PO TABS
40.0000 mg | ORAL_TABLET | Freq: Every day | ORAL | Status: DC
  Administered 2023-12-10 – 2023-12-11 (×3): 40 mg via ORAL
  Filled 2023-12-09 (×3): qty 1

## 2023-12-09 MED ORDER — GABAPENTIN 300 MG PO CAPS
300.0000 mg | ORAL_CAPSULE | Freq: Three times a day (TID) | ORAL | Status: DC
  Administered 2023-12-10 – 2023-12-12 (×8): 300 mg via ORAL
  Filled 2023-12-09 (×8): qty 1

## 2023-12-09 MED ORDER — LACTATED RINGERS IV SOLN: INTRAVENOUS | Status: AC

## 2023-12-09 MED ORDER — POTASSIUM CHLORIDE CRYS ER 20 MEQ PO TBCR
20.0000 meq | EXTENDED_RELEASE_TABLET | ORAL | Status: DC
  Administered 2023-12-10: 20 meq via ORAL
  Filled 2023-12-09: qty 1

## 2023-12-09 MED ORDER — LATANOPROST 0.005 % OP SOLN
1.0000 [drp] | Freq: Every day | OPHTHALMIC | Status: DC
  Administered 2023-12-10 – 2023-12-11 (×3): 1 [drp] via OPHTHALMIC
  Filled 2023-12-09: qty 2.5

## 2023-12-09 MED ORDER — SODIUM CHLORIDE 0.9 % IV SOLN: INTRAVENOUS | Status: DC

## 2023-12-09 MED ORDER — BACLOFEN 10 MG PO TABS
5.0000 mg | ORAL_TABLET | Freq: Two times a day (BID) | ORAL | Status: DC
  Administered 2023-12-10 – 2023-12-12 (×6): 5 mg via ORAL
  Filled 2023-12-09 (×6): qty 1

## 2023-12-09 MED ORDER — RIVAROXABAN 20 MG PO TABS
20.0000 mg | ORAL_TABLET | Freq: Every day | ORAL | Status: DC
  Administered 2023-12-10: 20 mg via ORAL
  Filled 2023-12-09: qty 1

## 2023-12-09 MED ORDER — OXYBUTYNIN CHLORIDE ER 10 MG PO TB24
10.0000 mg | ORAL_TABLET | Freq: Every day | ORAL | Status: DC
  Administered 2023-12-10 – 2023-12-12 (×3): 10 mg via ORAL
  Filled 2023-12-09 (×3): qty 1

## 2023-12-09 MED ORDER — SENNOSIDES-DOCUSATE SODIUM 8.6-50 MG PO TABS: 1.0000 | ORAL_TABLET | Freq: Every evening | ORAL | Status: DC | PRN

## 2023-12-09 MED ORDER — PROCHLORPERAZINE EDISYLATE 10 MG/2ML IJ SOLN: 5.0000 mg | Freq: Four times a day (QID) | INTRAMUSCULAR | Status: DC | PRN

## 2023-12-09 MED ORDER — BACLOFEN 10 MG PO TABS: 10.0000 mg | ORAL_TABLET | Freq: Two times a day (BID) | ORAL | Status: DC

## 2023-12-09 MED ORDER — DONEPEZIL HCL 10 MG PO TABS
10.0000 mg | ORAL_TABLET | Freq: Every day | ORAL | Status: DC
  Administered 2023-12-10 – 2023-12-11 (×3): 10 mg via ORAL
  Filled 2023-12-09 (×3): qty 1

## 2023-12-09 MED ORDER — BRIMONIDINE TARTRATE 0.15 % OP SOLN
1.0000 [drp] | Freq: Two times a day (BID) | OPHTHALMIC | Status: DC
  Administered 2023-12-10 – 2023-12-12 (×6): 1 [drp] via OPHTHALMIC
  Filled 2023-12-09: qty 5

## 2023-12-09 MED ORDER — MELATONIN 5 MG PO TABS
5.0000 mg | ORAL_TABLET | Freq: Every day | ORAL | Status: DC
  Administered 2023-12-10 – 2023-12-11 (×3): 5 mg via ORAL
  Filled 2023-12-09 (×3): qty 1

## 2023-12-09 MED ORDER — ACETAMINOPHEN 325 MG PO TABS
650.0000 mg | ORAL_TABLET | Freq: Four times a day (QID) | ORAL | Status: DC | PRN
  Administered 2023-12-10 – 2023-12-11 (×3): 650 mg via ORAL
  Filled 2023-12-09 (×2): qty 2

## 2023-12-09 NOTE — ED Provider Notes (Signed)
 Steven Kent EMERGENCY DEPARTMENT AT [facility] Provider Note   CSN: [id number] Arrival date & time: 12/09/23  1627     History  Chief Complaint  Patient presents with   Fatigue    Steven Kent is a 89 y.o. male.  89 year old male presents today from [place] nursing facility for concern of lethargy for the past 3 days.  However patient arrives alert and oriented and denies any complaints.  He states he would prefer not to be here and he is unsure why he was sent here.  He denies any pain or other complaints.  I spoke with the nurse from the facility.  He states he only typically takes care of him on the weekends and today is his first day with him since last weekend.  He states patient has been lethargic which she describes as somnolent and not cohesive.  He adds that this was not "stroke like".  He states his roommate also added that the patient has been like this for the past 3 days.  It appears that patient's symptoms are waxing and waning.  Ultimately patient was agreeable for workup in the emergency department to rule out infection.  He is alert and oriented and at his usual baseline that was described to me by the nursing home facility.  He does have history of CVA with right-sided deficits.  The history is provided by the patient and the nursing home. No language interpreter was used.       Home Medications Prior to Admission medications   Medication Sig Start Date End Date Taking? Authorizing Provider  atorvastatin  (LIPITOR) 40 MG tablet TAKE 1 TABLET(40 MG) BY MOUTH EVERY EVENING Patient taking differently: Take 40 mg by mouth at bedtime. 09/07/20   Rodney Clamp, MD  baclofen (LIORESAL) 10 MG tablet Take 10 mg by mouth 2 (two) times daily.    [provider]  brimonidine  (ALPHAGAN ) 0.15 % ophthalmic solution Place 1 drop into both eyes daily.    [provider]  chlorhexidine (PERIDEX) 0.12 % solution 10 mLs by Mouth Rinse route See  admin instructions. Swish 10 ml's as directed in the mouth on Mon/Wed/Fri- rinse and spit    [provider]  donepezil (ARICEPT) 10 MG tablet Take 10 mg by mouth at bedtime.    [provider]  dorzolamide  (TRUSOPT ) 2 % ophthalmic solution Place 1 drop into both eyes 2 (two) times daily.    [provider]  gabapentin  (NEURONTIN ) 800 MG tablet Take 800 mg by mouth 3 (three) times daily.    [provider]  latanoprost  (XALATAN ) 0.005 % ophthalmic solution Place 1 drop into both eyes at bedtime.    [provider]  lidocaine 4 % Place 1 patch onto the skin See admin instructions. Apply 1 patch to 3 sites (right arm, right forearm, and right lateral ankle) every morning and remove per schedule    [provider]  losartan (COZAAR) 25 MG tablet Take 12.5 mg by mouth in the morning.    [provider]  melatonin 5 MG TABS Take 5 mg by mouth at bedtime.    [provider]  mineral oil-hydrophilic petrolatum (AQUAPHOR) ointment Apply topically as needed for dry skin (bilateral legs and feet).    [provider]  oxybutynin  (DITROPAN -XL) 5 MG 24 hr tablet Take 5 mg by mouth at bedtime.    [provider]  torsemide  (DEMADEX ) 20 MG tablet Take 20 mg by mouth daily. 05/01/21  [provider]  TYLENOL  500 MG tablet Take 1,000 mg by mouth 2 (two) times daily.    [provider]  Vitamin D , Ergocalciferol , (DRISDOL ) 1.25 MG (50000 UNIT) CAPS capsule TAKE 1 CAPSULE BY MOUTH EVERY 7 DAYS Patient taking differently: Take 50,000 Units by mouth every Sunday. 12/21/20   Rodney Clamp, MD  XARELTO  20 MG TABS tablet TAKE 1 TABLET(20 MG) BY MOUTH EVERY EVENING Patient taking differently: Take 20 mg by mouth at bedtime. 09/07/20   Rodney Clamp, MD      Allergies    Aspirin    Review of Systems   Review of Systems  Constitutional:  Negative for fever.  Respiratory:  Negative for shortness of breath.    Cardiovascular:  Negative for chest pain.  Gastrointestinal:  Negative for abdominal pain.  Neurological:  Negative for weakness.  All other systems reviewed and are negative.   Physical Exam Updated Vital Signs BP (!) 142/76   Pulse 61   Temp (!) 97.5 F (36.4 C)   Resp 13   SpO2 98%  Physical Exam Vitals and nursing note reviewed.  Constitutional:      General: He is not in acute distress.    Appearance: Normal appearance. He is not ill-appearing.  HENT:     Head: Normocephalic and atraumatic.     Nose: Nose normal.  Eyes:     Conjunctiva/sclera: Conjunctivae normal.  Cardiovascular:     Rate and Rhythm: Normal rate and regular rhythm.  Pulmonary:     Effort: Pulmonary effort is normal. No respiratory distress.  Abdominal:     General: There is no distension.     Palpations: Abdomen is soft.     Tenderness: There is no abdominal tenderness. There is no guarding.  Musculoskeletal:        General: No deformity.     Right lower leg: No edema.     Left lower leg: No edema.  Skin:    Findings: No rash.  Neurological:     Mental Status: He is alert.     ED Results / Procedures / Treatments   Labs (all labs ordered are listed, but only abnormal results are displayed) Labs Reviewed  CBC WITH DIFFERENTIAL/PLATELET - Abnormal; Notable for the following components:      Result Value   MCV 100.6 (*)    All other components within normal limits  URINALYSIS, W/ REFLEX TO CULTURE (INFECTION SUSPECTED)  ACETAMINOPHEN  LEVEL  AMMONIA  COMPREHENSIVE METABOLIC PANEL WITH GFR  ETHANOL  MAGNESIUM  SALICYLATE LEVEL    EKG EKG Interpretation Date/Time:  Saturday Dec 09 2023 16:38:09 EDT Ventricular Rate:  60 PR Interval:  248 QRS Duration:  115 QT Interval:  473 QTC Calculation: 473 R Axis:   -38  Text Interpretation: Sinus rhythm Prolonged PR interval Nonspecific IVCD with LAD Left ventricular hypertrophy Anterior Q waves, possibly due to LVH Confirmed by Abner Hoffman [phone]) on 12/09/2023 5:39:13 PM  Radiology No results found.  Procedures Procedures    Medications Ordered in ED Medications - No data to display  ED Course/ Medical Decision Making/ A&P Clinical Course as of 12/09/23 2017  Sat Dec 09, 2023  2011 Creatinine(!): 1.67 [AA]    Clinical Course User Index [AA] Lucina Sabal, PA-C                                 Medical Decision Making Amount and/or Complexity  of Data Reviewed Labs: ordered. Decision-making details documented in ED Course. Radiology: ordered.  Risk Decision regarding hospitalization.   Medical Decision Making / ED Course   This patient presents to the ED for concern of confusion, this involves an extensive number of treatment options, and is a complaint that carries with it a high risk of complications and morbidity.  The differential diagnosis includes CVA, TIA, metabolic encephalopathy, viral URI  MDM: 89 year old male presents today from [facility] nursing home facility for concern of lethargy for the past 3 days.  Symptoms appear to be waxing and waning from our conversation with the nursing home facility.  Patient on arrival was alert and oriented and clearly did not want to be here but was ultimately in agreement to have some lab work and imaging done to rule out any obvious infection or metabolic derangement.  CBC without leukocytosis or anemia.  CMP with creatinine of 1.67 which is new and consistent with AKI.  Will give fluids.  Ethanol, salicylate, acetaminophen  levels within normal.  Ammonia within normal.  Magnesium 2.5.  UA without evidence of UTI.  CT head with remote infarct of left thalamus otherwise without any acute concerns. Chest x-ray without acute cardiopulmonary process.  Discussed with hospitalist will evaluate patient for admission.   Additional history obtained: -Additional history obtained from nursing home -External records from outside source obtained and reviewed  including: Chart review including previous notes, labs, imaging, consultation notes   Lab Tests: -I ordered, reviewed, and interpreted labs.   The pertinent results include:   Labs Reviewed  CBC WITH DIFFERENTIAL/PLATELET - Abnormal; Notable for the following components:      Result Value   MCV 100.6 (*)    All other components within normal limits  URINALYSIS, W/ REFLEX TO CULTURE (INFECTION SUSPECTED)  ACETAMINOPHEN  LEVEL  AMMONIA  COMPREHENSIVE METABOLIC PANEL WITH GFR  ETHANOL  MAGNESIUM  SALICYLATE LEVEL      EKG  EKG Interpretation Date/Time:  Saturday Dec 09 2023 16:38:09 EDT Ventricular Rate:  60 PR Interval:  248 QRS Duration:  115 QT Interval:  473 QTC Calculation: 473 R Axis:   -38  Text Interpretation: Sinus rhythm Prolonged PR interval Nonspecific IVCD with LAD Left ventricular hypertrophy Anterior Q waves, possibly due to LVH Confirmed by Abner Hoffman [phone]) on 12/09/2023 5:39:13 PM         Imaging Studies ordered: I ordered imaging studies including chest x-ray, CT head I independently visualized and interpreted imaging. I agree with the radiologist interpretation   Medicines ordered and prescription drug management: No orders of the defined types were placed in this encounter.   -I have reviewed the patients home medicines and have made adjustments as needed  Reevaluation: After the interventions noted above, I reevaluated the patient and found that they have :improved  Co morbidities that complicate the patient evaluation  Past Medical History:  Diagnosis Date   Abdominal aortic aneurysm (AAA) (HCC)    Hypertension    Stroke [facility])       Dispostion: Discussed with hospitalist who will evaluate patient for admission.   Final Clinical Impression(s) / ED Diagnoses Final diagnoses:  AKI (acute kidney injury) [facility])    Rx / DC Orders ED Discharge Orders     None         Lucina Sabal, PA-C 12/09/23 2018    Carin Charleston,  MD 12/09/23 2046

## 2023-12-09 NOTE — ED Notes (Signed)
 Pt given meal and drink. Able to feed self.

## 2023-12-09 NOTE — H&P (Signed)
 History and Physical    KOJO CINDRICH DGU:[id number] DOB: 1935-03-04 DOA: 12/09/2023  PCP: Stasia Edelman   Patient coming from: SNF   Chief Complaint: Lethargy, confusion   HPI: Steven Kent is a 89 y.o. male with medical history significant for hypertension, AAA, CVA with right-sided motor deficits, CAD, PAF on Xarelto , and chronic cognitive impairment who presents for evaluation of lethargy and confusion.  Staff from the patient's nursing facility became concerned that the patient has been lethargic and has had waxing and waning confusion for the past 3 days.  Patient reports feeling generally poor but has difficulty characterizing his symptoms any further though he denies chest pain, cough, shortness of breath, abdominal pain, vomiting, diarrhea, or dysuria.  He reports that he has been eating and drinking normally.  ED Course: Upon arrival to the ED, patient is found to be afebrile and saturating well on room air with normal RR, normal HR, and stable BP.  Labs are most notable for BUN 45, creatinine 1.67, normal WBC, and unremarkable urinalysis.  There are no acute findings on head CT and chest x-ray is notable for low volumes with mild left basilar infiltrate.  Patient was given a liter of NS in the ED.  Review of Systems:  All other systems reviewed and apart from HPI, are negative.  Past Medical History:  Diagnosis Date   Abdominal aortic aneurysm (AAA) (HCC)    Hypertension    Stroke [facility])     Past Surgical History:  Procedure Laterality Date   CATARACT EXTRACTION, BILATERAL     rt eye 10/09/2014 lt eye 11/06/2014   DENTAL SURGERY     REFRACTIVE SURGERY  01/2015    Social History:   reports that he has quit smoking. He has never used smokeless tobacco. He reports that he does not drink alcohol and does not use drugs.  Allergies  Allergen Reactions   Aspirin Other (See Comments)    Full Strength Asprin-325 mg causes upset stomach. Can take 81 mg.      Family History  Problem Relation Age of Onset   Prostate cancer Neg Hx      Prior to Admission medications   Medication Sig Start Date End Date Taking? Authorizing Provider  acetaminophen  (TYLENOL ) 500 MG tablet Take 1,000 mg by mouth 2 (two) times daily.   Yes [provider]  Amino Acids-Protein Hydrolys (PRO-STAT) LIQD Take 30 mLs by mouth See admin instructions. Give 30 mL by mouth twice daily at 1000 and 1400.   Yes [provider]  atorvastatin  (LIPITOR) 40 MG tablet TAKE 1 TABLET(40 MG) BY MOUTH EVERY EVENING Patient taking differently: Take 40 mg by mouth at bedtime. 09/07/20  Yes Rodney Clamp, MD  baclofen (LIORESAL) 10 MG tablet Take 10 mg by mouth 2 (two) times daily.   Yes [provider]  barrier cream (NON-SPECIFIED) CREA Apply 1 Application topically See admin instructions. Apply topically every shift to maintain skin integrity. Incontinence care.   Yes [provider]  brimonidine  (ALPHAGAN ) 0.15 % ophthalmic solution Place 1 drop into both eyes 2 (two) times daily.   Yes [provider]  chlorhexidine (PERIDEX) 0.12 % solution 10 mLs by Mouth Rinse route every Monday, Wednesday, and Friday. Rinse and spit.   Yes [provider]  donepezil (ARICEPT) 10 MG tablet Take 10 mg by mouth at bedtime.   Yes [provider]  dorzolamide -timolol (COSOPT) 2-0.5 % ophthalmic solution Place 1 drop into both eyes 2 (two) times daily.  Yes [provider]  gabapentin  (NEURONTIN ) 800 MG tablet Take 800 mg by mouth 3 (three) times daily.   Yes [provider]  latanoprost  (XALATAN ) 0.005 % ophthalmic solution Place 1 drop into both eyes at bedtime.   Yes [provider]  lidocaine 4 % Place 1 patch onto the skin See admin instructions. Apply 1 patch topically once every 24 hours as needed for pain.   Yes [provider]  losartan (COZAAR) 25 MG tablet Take 25 mg by mouth daily.   Yes  [provider]  melatonin 5 MG TABS Take 5 mg by mouth at bedtime.   Yes [provider]  Menthol, Topical Analgesic, 5 % GEL Apply 1 application  topically every 12 (twelve) hours as needed (pain).   Yes [provider]  mineral oil-hydrophilic petrolatum (AQUAPHOR) ointment Apply 1 Application topically See admin instructions. Apply topically to legs and feet every other day, may also apply daily as needed for dry skin.   Yes [provider]  oxybutynin  (DITROPAN -XL) 10 MG 24 hr tablet Take 10 mg by mouth daily.   Yes [provider]  potassium chloride  SA (KLOR-CON  M) 20 MEQ tablet Take 20 mEq by mouth every other day.   Yes [provider]  Sodium Fluoride 1.1 % PSTE Place 1 application  onto teeth at bedtime.   Yes [provider]  torsemide  (DEMADEX ) 20 MG tablet Take 20 mg by mouth daily. 05/01/21  Yes [provider]  UNABLE TO FIND 1 Application by Mouth Rinse route daily. "Antiseptic oral rinse"   Yes [provider]  Vitamin D , Ergocalciferol , (DRISDOL ) 1.25 MG (50000 UNIT) CAPS capsule TAKE 1 CAPSULE BY MOUTH EVERY 7 DAYS Patient taking differently: Take 50,000 Units by mouth every Sunday. 12/21/20  Yes Rodney Clamp, MD  XARELTO  20 MG TABS tablet TAKE 1 TABLET(20 MG) BY MOUTH EVERY EVENING Patient taking differently: Take 20 mg by mouth at bedtime. 09/07/20  Yes Rodney Clamp, MD    Physical Exam: Vitals:   12/09/23 1638 12/09/23 1638 12/09/23 2016  BP: (!) 142/76  (!) 140/65  Pulse:  61 (!) 58  Resp: 13  14  Temp:  (!) 97.5 F (36.4 C) 97.9 F (36.6 C)  SpO2:  98% 100%    Constitutional: NAD, no pallor or diaphoresis   Eyes: PERTLA, lids and conjunctivae normal ENMT: Mucous membranes are moist. Posterior pharynx clear of any exudate or lesions.   Neck: supple, no masses  Respiratory: no wheezing, no crackles. No accessory muscle use.  Cardiovascular: S1 & S2 heard, regular rate and rhythm.  Lower leg edema bilaterally.   Abdomen: No distension, no tenderness, soft. Bowel sounds active.  Musculoskeletal: no clubbing / cyanosis. No joint deformity upper and lower extremities.   Skin: no significant rashes, lesions, ulcers. Warm, dry, well-perfused. Neurologic: No aphasia or dysarthria, PERRL, EOMI. Right-sided weakness. Alert and oriented to person, place, and situation.  Psychiatric: Calm. Cooperative.    Labs and Imaging on Admission: I have personally reviewed following labs and imaging studies  CBC: Recent Labs  Lab 12/09/23 1715  WBC 8.4  NEUTROABS 5.4  HGB 14.9  HCT 46.7  MCV 100.6*  PLT 190   Basic Metabolic Panel: Recent Labs  Lab 12/09/23 1715  NA 145  K 3.4*  CL 102  CO2 30  GLUCOSE 115*  BUN 45*  CREATININE 1.67*  CALCIUM  9.2  MG 2.5*   GFR: CrCl cannot be calculated (Unknown ideal  weight.). Liver Function Tests: Recent Labs  Lab 12/09/23 1715  AST 20  ALT 9  ALKPHOS 57  BILITOT 1.4*  PROT 8.4*  ALBUMIN 3.8   No results for input(s): "LIPASE", "AMYLASE" in the last 168 hours. Recent Labs  Lab 12/09/23 1715  AMMONIA <13   Coagulation Profile: No results for input(s): "INR", "PROTIME" in the last 168 hours. Cardiac Enzymes: No results for input(s): "CKTOTAL", "CKMB", "CKMBINDEX", "TROPONINI" in the last 168 hours. BNP (last 3 results) No results for input(s): "PROBNP" in the last 8760 hours. HbA1C: No results for input(s): "HGBA1C" in the last 72 hours. CBG: No results for input(s): "GLUCAP" in the last 168 hours. Lipid Profile: No results for input(s): "CHOL", "HDL", "LDLCALC", "TRIG", "CHOLHDL", "LDLDIRECT" in the last 72 hours. Thyroid  Function Tests: No results for input(s): "TSH", "T4TOTAL", "FREET4", "T3FREE", "THYROIDAB" in the last 72 hours. Anemia Panel: No results for input(s): "VITAMINB12", "FOLATE", "FERRITIN", "TIBC", "IRON", "RETICCTPCT" in the last 72 hours. Urine analysis:    Component Value Date/Time    COLORURINE YELLOW 12/09/2023 1709   APPEARANCEUR CLEAR 12/09/2023 1709   LABSPEC 1.012 12/09/2023 1709   PHURINE 5.0 12/09/2023 1709   GLUCOSEU NEGATIVE 12/09/2023 1709   HGBUR NEGATIVE 12/09/2023 1709   BILIRUBINUR NEGATIVE 12/09/2023 1709   KETONESUR NEGATIVE 12/09/2023 1709   PROTEINUR NEGATIVE 12/09/2023 1709   NITRITE NEGATIVE 12/09/2023 1709   LEUKOCYTESUR NEGATIVE 12/09/2023 1709   Sepsis Labs: @LABRCNTIP (procalcitonin:4,lacticidven:4) )No results found for this or any previous visit (from the past 240 hours).   Radiological Exams on Admission: DG Chest Port 1 View Result Date: 12/09/2023 CLINICAL DATA:  Altered mental status. EXAM: PORTABLE CHEST 1 VIEW COMPARISON:  August 02, 2022 FINDINGS: The cardiac silhouette is mildly enlarged and unchanged in size. A radiopaque loop recorder device is seen. There is marked severity calcification of the aortic arch. Low lung volumes are noted with mild infiltrate seen within the left lung base. No pleural effusion or pneumothorax is identified. Multilevel degenerative changes are present throughout the thoracic spine. IMPRESSION: Low lung volumes with mild left basilar infiltrate. Electronically Signed   By: Virgle Grime M.D.   On: 12/09/2023 19:55   CT HEAD WO CONTRAST ( ) Result Date: 12/09/2023 CLINICAL DATA:  Mental status change, fatigue. EXAM: CT HEAD WITHOUT CONTRAST TECHNIQUE: Contiguous axial images were obtained from the base of the skull through the vertex without intravenous contrast. RADIATION DOSE REDUCTION: This exam was performed according to the departmental dose-optimization program which includes automated exposure control, adjustment of the mA and/or kV according to patient size and/or use of iterative reconstruction technique. COMPARISON:  MRI head 12/12/2020. FINDINGS: Brain: No acute intracranial hemorrhage. Hypoattenuation in the periventricular and subcortical white matter suggestive of extensive chronic  microvascular ischemic changes. Remote lacunar infarcts in the bilateral basal ganglia. Remote infarct in the left thalamus which is new since 2022 MRI. No edema, mass effect, or midline shift. The basilar cisterns are patent. Ventricles: Prominence of the ventricles suggesting underlying parenchymal volume loss. Vascular: No hyperdense vessel or unexpected calcification. Skull: No acute or aggressive finding. Orbits: Orbits are symmetric. Sinuses: Mucosal thickening in the bilateral ethmoid sinuses. Complete opacification of the right frontal sinus. Additional scattered mucosal thickening and secretions in the bilateral maxillary and sphenoid sinuses. Other: Mastoid air cells are clear. IMPRESSION: No CT evidence of acute intracranial abnormality. Extensive chronic microvascular ischemic changes. Mild parenchymal volume loss. Remote infarct in the left thalamus is new since MRI in 2022. Paranasal sinus disease as above. Electronically  Signed   By: Denny Flack M.D.   On: 12/09/2023 19:44    EKG: Independently reviewed. Sinus rhythm, 1st degree AV block, non-specific IVCD with LAD, LVH.   Assessment/Plan   1. Acute encephalopathy  - Presents with 3 days of waxing/waning confusion  - No acute findings on head CT in ED and no infectious symptoms - Potentially d/t dehydration with AKI and decreased clearance of baclofen and gabapentin  - Continue IVF hydration, reduce baclofen and gabapentin  dosing for now, use delirium precautions    2. AKI  - SCr is 1.67 on admission, up from apparent baseline of <1  - Check FEUrea and renal US , hold torsemide  and losartan, renally-dose medications, repeat chem panel in am    3. Hx of CVA  - Continue Lipitor and Xarelto     4. PAF  - Xarelto     5. Cognitive impairment  - Use delirium precautions, continue Aricept     DVT prophylaxis: Xarelto   Code Status: Full  Level of Care: Level of care: Med-Surg Family Communication: None present  Disposition Plan:   Patient is from: SNF  Anticipated d/c is to: SNF  Anticipated d/c date is: 5/11 or 12/11/23  Patient currently: Pending repeat chemistry panel, renal US , clinical stability  Consults called: None  Admission status: Observation     Walton Guppy, MD [facility]  12/09/2023, 8:31 PM

## 2023-12-09 NOTE — ED Notes (Signed)
 Floor made aware that patient is coming up.

## 2023-12-09 NOTE — ED Triage Notes (Addendum)
 Patient BIB [facility] for lethargy x 3 days.  Patient A&O. Facility called for evaluation.  124/76 70 NSR, 98%RA 123CBG

## 2023-12-10 ENCOUNTER — Observation Stay (HOSPITAL_COMMUNITY)

## 2023-12-10 DIAGNOSIS — N179 Acute kidney failure, unspecified: Secondary | ICD-10-CM | POA: Diagnosis present

## 2023-12-10 DIAGNOSIS — Z79899 Other long term (current) drug therapy: Secondary | ICD-10-CM | POA: Diagnosis not present

## 2023-12-10 DIAGNOSIS — E876 Hypokalemia: Secondary | ICD-10-CM | POA: Diagnosis not present

## 2023-12-10 DIAGNOSIS — Z886 Allergy status to analgesic agent status: Secondary | ICD-10-CM | POA: Diagnosis not present

## 2023-12-10 DIAGNOSIS — Z9842 Cataract extraction status, left eye: Secondary | ICD-10-CM | POA: Diagnosis not present

## 2023-12-10 DIAGNOSIS — I10 Essential (primary) hypertension: Secondary | ICD-10-CM | POA: Diagnosis present

## 2023-12-10 DIAGNOSIS — Z87891 Personal history of nicotine dependence: Secondary | ICD-10-CM | POA: Diagnosis not present

## 2023-12-10 DIAGNOSIS — I2510 Atherosclerotic heart disease of native coronary artery without angina pectoris: Secondary | ICD-10-CM | POA: Diagnosis present

## 2023-12-10 DIAGNOSIS — F0390 Unspecified dementia without behavioral disturbance: Secondary | ICD-10-CM | POA: Diagnosis present

## 2023-12-10 DIAGNOSIS — I480 Paroxysmal atrial fibrillation: Secondary | ICD-10-CM | POA: Diagnosis present

## 2023-12-10 DIAGNOSIS — J690 Pneumonitis due to inhalation of food and vomit: Secondary | ICD-10-CM | POA: Diagnosis present

## 2023-12-10 DIAGNOSIS — E860 Dehydration: Secondary | ICD-10-CM | POA: Diagnosis present

## 2023-12-10 DIAGNOSIS — G9341 Metabolic encephalopathy: Secondary | ICD-10-CM | POA: Diagnosis present

## 2023-12-10 DIAGNOSIS — G9340 Encephalopathy, unspecified: Secondary | ICD-10-CM | POA: Diagnosis present

## 2023-12-10 DIAGNOSIS — Z7901 Long term (current) use of anticoagulants: Secondary | ICD-10-CM | POA: Diagnosis not present

## 2023-12-10 DIAGNOSIS — I69351 Hemiplegia and hemiparesis following cerebral infarction affecting right dominant side: Secondary | ICD-10-CM | POA: Diagnosis not present

## 2023-12-10 DIAGNOSIS — I714 Abdominal aortic aneurysm, without rupture, unspecified: Secondary | ICD-10-CM | POA: Diagnosis present

## 2023-12-10 DIAGNOSIS — Z9841 Cataract extraction status, right eye: Secondary | ICD-10-CM | POA: Diagnosis not present

## 2023-12-10 LAB — HEPATIC FUNCTION PANEL
ALT: 9 U/L (ref 0–44)
AST: 18 U/L (ref 15–41)
Albumin: 3.2 g/dL — ABNORMAL LOW (ref 3.5–5.0)
Alkaline Phosphatase: 47 U/L (ref 38–126)
Bilirubin, Direct: 0.3 mg/dL — ABNORMAL HIGH (ref 0.0–0.2)
Indirect Bilirubin: 1.1 mg/dL — ABNORMAL HIGH (ref 0.3–0.9)
Total Bilirubin: 1.4 mg/dL — ABNORMAL HIGH (ref 0.0–1.2)
Total Protein: 7.4 g/dL (ref 6.5–8.1)

## 2023-12-10 LAB — BASIC METABOLIC PANEL WITH GFR
Anion gap: 11 (ref 5–15)
BUN: 40 mg/dL — ABNORMAL HIGH (ref 8–23)
CO2: 31 mmol/L (ref 22–32)
Calcium: 9.2 mg/dL (ref 8.9–10.3)
Chloride: 103 mmol/L (ref 98–111)
Creatinine, Ser: 1.53 mg/dL — ABNORMAL HIGH (ref 0.61–1.24)
GFR, Estimated: 44 mL/min — ABNORMAL LOW (ref >60)
Glucose, Bld: 106 mg/dL — ABNORMAL HIGH (ref 70–99)
Potassium: 3.4 mmol/L — ABNORMAL LOW (ref 3.5–5.1)
Sodium: 145 mmol/L (ref 135–145)

## 2023-12-10 LAB — CBC
HCT: 42.7 % (ref 39.0–52.0)
Hemoglobin: 13.7 g/dL (ref 13.0–17.0)
MCH: 32.4 pg (ref 26.0–34.0)
MCHC: 32.1 g/dL (ref 30.0–36.0)
MCV: 100.9 fL — ABNORMAL HIGH (ref 80.0–100.0)
Platelets: 176 10*3/uL (ref 150–400)
RBC: 4.23 MIL/uL (ref 4.22–5.81)
RDW: 14 % (ref 11.5–15.5)
WBC: 8.1 10*3/uL (ref 4.0–10.5)
nRBC: 0 % (ref 0.0–0.2)

## 2023-12-10 LAB — MRSA NEXT GEN BY PCR, NASAL: MRSA by PCR Next Gen: NOT DETECTED

## 2023-12-10 MED ORDER — SODIUM CHLORIDE 0.9 % IV SOLN: INTRAVENOUS | Status: DC

## 2023-12-10 MED ORDER — RIVAROXABAN 15 MG PO TABS
15.0000 mg | ORAL_TABLET | Freq: Every day | ORAL | Status: DC
  Administered 2023-12-10: 15 mg via ORAL
  Filled 2023-12-10: qty 1

## 2023-12-10 MED ORDER — LACTATED RINGERS IV SOLN: INTRAVENOUS | Status: DC

## 2023-12-10 MED ORDER — PNEUMOCOCCAL 20-VAL CONJ VACC 0.5 ML IM SUSY
0.5000 mL | PREFILLED_SYRINGE | INTRAMUSCULAR | Status: DC
  Filled 2023-12-10: qty 0.5

## 2023-12-10 MED ORDER — SODIUM CHLORIDE 0.9 % IV SOLN
3.0000 g | Freq: Three times a day (TID) | INTRAVENOUS | Status: DC
  Administered 2023-12-10 – 2023-12-11 (×3): 3 g via INTRAVENOUS
  Filled 2023-12-10 (×3): qty 8

## 2023-12-10 NOTE — Evaluation (Signed)
 Physical Therapy Evaluation & Discharge Patient Details Name: Steven Kent MRN: [id number] DOB: [birth year] Today's Date: 12/10/2023  History of Present Illness  89 y.o. male admitted from SNF on 12/09/23 with ongoing lethargy, confusion, cough. Workup for acute metabolic encephalopathy, AKI, PNA with concern for aspiration. PMH includes CVA (R-side deficits), CAD, AAA, HTN, PAF on Xarelto , chronic memory impairment.   Clinical Impression  Patient evaluated by Physical Therapy with no further acute PT needs identified. PTA, pt resident at [facility] LTC with staff assist for hoyer lift transfers to wheelchair, bed-level ADL tasks. Today, pt pleasant and cooperative, demonstrates some awareness of current deficits and able to explain PLOF. Pt requiring modA for bed-level mobility and repositioning; pt with chronic R-side weakness from prior stroke and BLE flexion contractures, very poor tolerance for PROM and repositioning secondary to pain. All education has been completed and the patient has no further questions. Will require maximove for OOB transfers with nursing staff. Acute PT is signing off. Thank you for this referral.      If plan is discharge home, recommend the following: Two people to help with walking and/or transfers;Two people to help with bathing/dressing/bathroom   Can travel by private vehicle   No    Equipment Recommendations None recommended by PT  Recommendations for Other Services       Functional Status Assessment       Precautions / Restrictions Precautions Precautions: Fall;Other (comment) Recall of Precautions/Restrictions: Impaired Precaution/Restrictions Comments: h/o CVA with residual R-side weakness Restrictions Weight Bearing Restrictions Per Provider Order: No      Mobility  Bed Mobility Overal bed mobility: Needs Assistance Bed Mobility: Rolling Rolling: Mod assist         General bed mobility comments: significant increased time to reposition  hips/trunk to initiate rolling to R-side, modA to fully rotate hips/trunk, pt keeping BLEs flexed. pt able to scoot up in flat bed (increased time to flatten bed due to stiffness/pain) with use of LUE on bed rail, modA for scooting hips with pad; modA for LUE HHA to reposition shoulders; deferred attempt to sit EOB secondary to pain and need for +2 assist    Transfers                   General transfer comment: baseline hoyer lift for OOB transfer to w/c    Ambulation/Gait                  Stairs            Wheelchair Mobility     Tilt Bed    Modified Rankin (Stroke Patients Only)       Balance Overall balance assessment: Needs assistance                                           Pertinent Vitals/Pain Pain Assessment Pain Assessment: Faces Faces Pain Scale: Hurts whole lot Pain Location: RLE to touch and attempts at BLE knee extension Pain Descriptors / Indicators: Discomfort, Grimacing, Guarding, Moaning Pain Intervention(s): Monitored during session, Limited activity within patient's tolerance, Repositioned    Home Living Family/patient expects to be discharged to:: Skilled nursing facility                   Additional Comments: reports being at [facility] for 2 years    Prior Function Prior Level of Function : Needs assist;Patient  poor historian/Family not available             Mobility Comments: reports staff uses lift to transfer to w/c, able to self-propel manual with use of R-side; has worked with rehab at [facility]. notable R-side and BLE flexion contractures ADLs Comments: bed-level ADLs with assist from staff, wears briefs     Extremity/Trunk Assessment   Upper Extremity Assessment Upper Extremity Assessment: RUE deficits/detail;LUE deficits/detail RUE Deficits / Details: chronic R-side weakness from prior stroke (2014) with minimal AROM noted; pt using LUE to flex R shoulder to ~45' limited by pain, lacking  full passive extension, fingers flexed into fist with DIP ext and pt with very poor tolerance for attempts to open fingers to clean palm, poor tolerance for any RUE PROM RUE Coordination: decreased gross motor;decreased fine motor LUE Deficits / Details: functional strength >/ 3/5 able to flex/abd overhead to reach top bed rail, using LUE to move RUE LUE Coordination: decreased gross motor;decreased fine motor    Lower Extremity Assessment Lower Extremity Assessment: RLE deficits/detail;LLE deficits/detail RLE Deficits / Details: chronic R-side weakness from prior stroke (2014) with some AROM noted, able to partially extend hip/knee though grimacing/moaning in pain with attempts; BLE hip/knee flexion contracture; noted flaky skin bilateral lower legs; poor tolerance to touch and attempts at RLE PROM RLE Coordination: decreased gross motor;decreased fine motor LLE Deficits / Details: BLE hip/knee flexion contracture; functional strength </ 3/5; noted flaky skin bilateral lower legs LLE Coordination: decreased gross motor;decreased fine motor       Communication   Communication Communication: No apparent difficulties    Cognition Arousal: Alert Behavior During Therapy: WFL for tasks assessed/performed   PT - Cognitive impairments: History of cognitive impairments                       PT - Cognition Comments: h/o memory impairment. following simple commands and seemingly answering majority of questions appropriately with intermittent disorientation. when asked about pain, pt states, "My mind hurts from not being at home with my wife and grandbaby" Following commands: Intact       Cueing Cueing Techniques: Verbal cues, Tactile cues     General Comments General comments (skin integrity, edema, etc.): pt preference for partial L-sidelying secondary to R-side pain, repositioned for partial pressure relief to L-sacrum, pillows between knees/medial malleoli for pressure relief as  pt resting with BLE flexion contracture. productive cough during session, able to clear secretions with increased time    Exercises     Assessment/Plan    PT Assessment Patient does not need any further PT services  PT Problem List         PT Treatment Interventions      PT Goals (Current goals can be found in the Care Plan section)  Acute Rehab PT Goals PT Goal Formulation: All assessment and education complete, DC therapy    Frequency       Co-evaluation               AM-PAC PT "6 Clicks" Mobility  Outcome Measure Help needed turning from your back to your side while in a flat bed without using bedrails?: Total Help needed moving from lying on your back to sitting on the side of a flat bed without using bedrails?: Total Help needed moving to and from a bed to a chair (including a wheelchair)?: Total Help needed standing up from a chair using your arms (e.g., wheelchair or bedside chair)?: Total Help needed to  walk in hospital room?: Total Help needed climbing 3-5 steps with a railing? : Total 6 Click Score: 6    End of Session   Activity Tolerance: Patient tolerated treatment well;Patient limited by pain Patient left: in bed;with call bell/phone within reach;with bed alarm set Nurse Communication: Mobility status PT Visit Diagnosis: Other abnormalities of gait and mobility (R26.89)    Time: 1610-9604 PT Time Calculation (min) (ACUTE ONLY): 25 min   Charges:   PT Evaluation $PT Eval Low Complexity: 1 Low PT Treatments $Therapeutic Activity: 8-22 mins PT General Charges $$ ACUTE PT VISIT: 1 Visit       Blase Bur, PT, DPT Acute Rehabilitation Services  Personal: Secure Chat Rehab Office: [phone]  Berthel Bagnall L Dabid [place] 12/10/2023, 3:15 PM

## 2023-12-10 NOTE — Care Management Obs Status (Addendum)
 MEDICARE OBSERVATION STATUS NOTIFICATION   Patient Details  Name: Steven Kent MRN: [id number] Date of Birth: 04/25/1935   Medicare Observation Status Notification Given:   MOON delivery attempt made, patient sleeping. Did not awake to voice or touch.    Jannine Meo, RN 12/10/2023, 8:32 AM

## 2023-12-10 NOTE — Plan of Care (Signed)

## 2023-12-10 NOTE — Progress Notes (Signed)
 PROGRESS NOTE  [name] MWU:[id number] DOB: 02/25/1935 DOA: 12/09/2023 PCP: Stasia Edelman   LOS: 0 days   Brief Narrative / Interim history: Steven Kent is a 89 y.o. male with medical history significant for hypertension, AAA, CVA with right-sided motor deficits, CAD, PAF on Xarelto , and chronic cognitive impairment who presents for evaluation of lethargy and confusion.  I spoke with his wife over the phone, she visits him regularly and he is alert, conversant, and pleasant at baseline.  He has memory issues.  Apparently the day when he was brought to the hospital he was very lethargic and poorly responsive  Subjective / 24h Interval events: Complains of weakness, nothing specific.  He was also complaining of a cough which tells me it is been going on for 1-2 weeks.  Every time he coughs his right leg contracts and he has pain.  He appears confused as to why he is in the hospital and where he is at  Assesement and Plan: Principal Problem:   Acute encephalopathy Active Problems:   Hemiparesis affecting right side as late effect of cerebrovascular accident [facility])   Essential hypertension   Coronary artery disease involving native heart without angina pectoris   Paroxysmal atrial fibrillation (HCC)   Cognitive impairment   AKI (acute kidney injury) (HCC)  Principal problem Acute kidney injury-apparent baseline less than 1, creatinine 1.6 on admission, slightly better at 1.5 but clinically appears dehydrated still, continue IV fluids -bladder scan to ensure no retention  Active problems Pneumonia, concern for aspiration-patient with a productive cough worsening over the last week or 2.  Wife has noticed it.  Suspect a degree of aspiration especially given his unresponsive episode.  Placed on Unasyn for total of 5 days  Acute metabolic encephalopathy-remains confused, possibly due to dehydration acute kidney injury with decreased clearance of baclofen and gabapentin .  These  medications are continued at a lower dose -continue IV fluids  Dementia-continue donepezil.  Unclear baseline  PAF-continue anticoagulation with Xarelto .  He does not seem to be on rate controlling agents, heart rate in the 50s-60s  Hypokalemia-replace potassium as indicated  History of stroke-with right-sided weakness.  Continue statin, Xarelto   Essential hypertension-losartan on hold due to AKI.  He is normotensive  Scheduled Meds:  atorvastatin   40 mg Oral QHS   baclofen  5 mg Oral BID   brimonidine   1 drop Both Eyes BID   donepezil  10 mg Oral QHS   dorzolamide -timolol  1 drop Both Eyes BID   gabapentin   300 mg Oral TID   latanoprost   1 drop Both Eyes QHS   melatonin  5 mg Oral QHS   oxybutynin   10 mg Oral Daily   [START ON 12/11/2023] pneumococcal 20-valent conjugate vaccine  0.5 mL Intramuscular Tomorrow-1000   potassium chloride  SA  20 mEq Oral QODAY   rivaroxaban   15 mg Oral QHS   Continuous Infusions:  lactated ringers      PRN Meds:.acetaminophen  **OR** acetaminophen , prochlorperazine, senna-docusate  Current Outpatient Medications  Medication Instructions   acetaminophen  (TYLENOL ) 1,000 mg, Oral, 2 times daily   Amino Acids-Protein Hydrolys (PRO-STAT) LIQD 30 mLs, Oral, See admin instructions, Give 30 mL by mouth twice daily at 1000 and 1400.   atorvastatin  (LIPITOR) 40 MG tablet TAKE 1 TABLET(40 MG) BY MOUTH EVERY EVENING   baclofen (LIORESAL) 10 mg, 2 times daily   barrier cream (NON-SPECIFIED) CREA 1 Application, Topical, See admin instructions, Apply topically every shift to maintain skin integrity. Incontinence care.   brimonidine  (  ALPHAGAN ) 0.15 % ophthalmic solution 1 drop, 2 times daily   chlorhexidine (PERIDEX) 0.12 % solution 10 mLs, Every M-W-F   donepezil (ARICEPT) 10 mg, Daily at bedtime   dorzolamide -timolol (COSOPT) 2-0.5 % ophthalmic solution 1 drop, Both Eyes, 2 times daily   gabapentin  (NEURONTIN ) 800 mg, 3 times daily   latanoprost  (XALATAN )  0.005 % ophthalmic solution 1 drop, Daily at bedtime   lidocaine 4 % 1 patch, See admin instructions   losartan (COZAAR) 25 mg, Daily   melatonin 5 mg, Daily at bedtime   Menthol, Topical Analgesic, 5 % GEL 1 application , Every 12 hours PRN   mineral oil-hydrophilic petrolatum (AQUAPHOR) ointment 1 Application, Topical, See admin instructions, Apply topically to legs and feet every other day, may also apply daily as needed for dry skin.   oxybutynin  (DITROPAN -XL) 10 mg, Oral, Daily   potassium chloride  SA (KLOR-CON  M) 20 MEQ tablet 20 mEq, Oral, Every other day   Sodium Fluoride 1.1 % PSTE 1 application , dental, Daily at bedtime   torsemide  (DEMADEX ) 20 mg, Daily   UNABLE TO FIND 1 Application, Mouth Rinse, Daily, "Antiseptic oral rinse"   Vitamin D , Ergocalciferol , (DRISDOL ) 1.25 MG (50000 UNIT) CAPS capsule TAKE 1 CAPSULE BY MOUTH EVERY 7 DAYS   XARELTO  20 MG TABS tablet TAKE 1 TABLET(20 MG) BY MOUTH EVERY EVENING    Diet Orders (From admission, onward)     Start     Ordered   12/09/23 2029  Diet regular Room service appropriate? Yes; Fluid consistency: Thin  Diet effective now       Question Answer Comment  Room service appropriate? Yes   Fluid consistency: Thin      12/09/23 2030            DVT prophylaxis:  Rivaroxaban  (XARELTO ) tablet 15 mg   Lab Results  Component Value Date   PLT 176 12/10/2023      Code Status: Full Code  Family Communication: wife over the phone  Status is: Observation The patient will require care spanning > 2 midnights and should be moved to inpatient because: AKI, IV fluids  Level of care: Med-Surg  Consultants:  none  Objective: Vitals:   12/09/23 2016 12/09/23 2100 12/10/23 0447 12/10/23 0734  BP: (!) 140/65 (!) 176/82 130/80 123/71  Pulse: (!) 58 77 60 (!) 57  Resp: 14 15 18    Temp: 97.9 F (36.6 C) 97.9 F (36.6 C) 98 F (36.7 C) 98 F (36.7 C)  TempSrc:  Oral  Oral  SpO2: 100% 96% 93% 97%    Intake/Output Summary  (Last 24 hours) at 12/10/2023 1012 Last data filed at 12/10/2023 0600 Gross per 24 hour  Intake 581.37 ml  Output --  Net 581.37 ml   Wt Readings from Last 3 Encounters:  04/05/23 81.6 kg  08/03/22 82 kg  07/29/21 81.6 kg    Examination:  Constitutional: NAD Eyes: no scleral icterus ENMT: Mucous membranes are moist.  Neck: normal, supple Respiratory: clear to auscultation bilaterally, no wheezing, no crackles.  Cardiovascular: Regular rate and rhythm, no murmurs / rubs / gallops.  Abdomen: non distended, no tenderness. Bowel sounds positive.  Musculoskeletal: no clubbing / cyanosis.    Data Reviewed: I have independently reviewed following labs and imaging studies   CBC Recent Labs  Lab 12/09/23 1715 12/10/23 0501  WBC 8.4 8.1  HGB 14.9 13.7  HCT 46.7 42.7  PLT 190 176  MCV 100.6* 100.9*  MCH 32.1 32.4  MCHC 31.9  32.1  RDW 14.2 14.0  LYMPHSABS 2.0  --   MONOABS 0.7  --   EOSABS 0.3  --   BASOSABS 0.0  --     Recent Labs  Lab 12/09/23 1715 12/10/23 0501  NA 145 145  K 3.4* 3.4*  CL 102 103  CO2 30 31  GLUCOSE 115* 106*  BUN 45* 40*  CREATININE 1.67* 1.53*  CALCIUM  9.2 9.2  AST 20 18  ALT 9 9  ALKPHOS 57 47  BILITOT 1.4* 1.4*  ALBUMIN 3.8 3.2*  MG 2.5*  --   AMMONIA <13  --     ------------------------------------------------------------------------------------------------------------------ No results for input(s): "CHOL", "HDL", "LDLCALC", "TRIG", "CHOLHDL", "LDLDIRECT" in the last 72 hours.  Lab Results  Component Value Date   HGBA1C 5.9 (H) 12/12/2020   ------------------------------------------------------------------------------------------------------------------ No results for input(s): "TSH", "T4TOTAL", "T3FREE", "THYROIDAB" in the last 72 hours.  Invalid input(s): "FREET3"  Cardiac Enzymes No results for input(s): "CKMB", "TROPONINI", "MYOGLOBIN" in the last 168 hours.  Invalid input(s):  "CK" ------------------------------------------------------------------------------------------------------------------    Component Value Date/Time   BNP 41.3 08/23/2019 1655    CBG: No results for input(s): "GLUCAP" in the last 168 hours.  No results found for this or any previous visit (from the past 240 hours).   Radiology Studies: US  RENAL Result Date: 12/10/2023 CLINICAL DATA:  Acute kidney injury. EXAM: RENAL / URINARY TRACT ULTRASOUND COMPLETE COMPARISON:  None Available. FINDINGS: Right Kidney: Renal measurements: 9.8 x 6.5 x 5.5 cm = volume: 184.5 mL. Echogenicity within normal limits. No mass or hydronephrosis visualized. Left Kidney: Renal measurements: 9.9 x 5.1 x 5.0 cm = volume: 134.2 mL. Echogenicity within normal limits. No mass or hydronephrosis visualized. Bladder: Appears normal for degree of bladder distention. Bilateral ureteral jets visualized. Other: Exam detail is limited by patient body habitus and contractions involving the right upper extremity. IMPRESSION: No acute findings. No hydronephrosis. Electronically Signed   By: Kimberley Penman M.D.   On: 12/10/2023 05:50   DG Chest Port 1 View Result Date: 12/09/2023 CLINICAL DATA:  Altered mental status. EXAM: PORTABLE CHEST 1 VIEW COMPARISON:  August 02, 2022 FINDINGS: The cardiac silhouette is mildly enlarged and unchanged in size. A radiopaque loop recorder device is seen. There is marked severity calcification of the aortic arch. Low lung volumes are noted with mild infiltrate seen within the left lung base. No pleural effusion or pneumothorax is identified. Multilevel degenerative changes are present throughout the thoracic spine. IMPRESSION: Low lung volumes with mild left basilar infiltrate. Electronically Signed   By: Virgle Grime M.D.   On: 12/09/2023 19:55   CT HEAD WO CONTRAST ( ) Result Date: 12/09/2023 CLINICAL DATA:  Mental status change, fatigue. EXAM: CT HEAD WITHOUT CONTRAST TECHNIQUE: Contiguous  axial images were obtained from the base of the skull through the vertex without intravenous contrast. RADIATION DOSE REDUCTION: This exam was performed according to the departmental dose-optimization program which includes automated exposure control, adjustment of the mA and/or kV according to patient size and/or use of iterative reconstruction technique. COMPARISON:  MRI head 12/12/2020. FINDINGS: Brain: No acute intracranial hemorrhage. Hypoattenuation in the periventricular and subcortical white matter suggestive of extensive chronic microvascular ischemic changes. Remote lacunar infarcts in the bilateral basal ganglia. Remote infarct in the left thalamus which is new since 2022 MRI. No edema, mass effect, or midline shift. The basilar cisterns are patent. Ventricles: Prominence of the ventricles suggesting underlying parenchymal volume loss. Vascular: No hyperdense vessel or unexpected calcification. Skull: No acute or aggressive  finding. Orbits: Orbits are symmetric. Sinuses: Mucosal thickening in the bilateral ethmoid sinuses. Complete opacification of the right frontal sinus. Additional scattered mucosal thickening and secretions in the bilateral maxillary and sphenoid sinuses. Other: Mastoid air cells are clear. IMPRESSION: No CT evidence of acute intracranial abnormality. Extensive chronic microvascular ischemic changes. Mild parenchymal volume loss. Remote infarct in the left thalamus is new since MRI in 2022. Paranasal sinus disease as above. Electronically Signed   By: Denny Flack M.D.   On: 12/09/2023 19:44     Kathlen Para, MD, PhD [facility]  Between 7 am - 7 pm I am available, please contact me via Amion (for emergencies) or Securechat (non urgent messages)  Between 7 pm - 7 am I am not available, please contact night coverage MD/APP via Amion

## 2023-12-10 NOTE — Care Management Obs Status (Signed)
 MEDICARE OBSERVATION STATUS NOTIFICATION   Patient Details  Name: Steven Kent MRN: [id number] Date of Birth: 06-26-1935   Medicare Observation Status Notification Given:  Yes  Patient reports visually impaired and gave verbal consent for RNCM to provide signature.  Jannine Meo, RN 12/10/2023, 9:36 AM

## 2023-12-10 NOTE — Progress Notes (Signed)
 Pharmacy Antibiotic Note  Steven Kent is a 89 y.o. male admitted on 12/09/2023 with pneumonia.  Pharmacy has been consulted for unasyn dosing.  Plan: Unasyn 3 grams iv q8h  Height: 6\' 2"  (188 cm) Weight: 81.6 kg (179 lb 14.3 oz) (need an updated weight. This is from september 2024) IBW/kg (Calculated) : 82.2  Temp (24hrs), Avg:97.9 F (36.6 C), Min:97.5 F (36.4 C), Max:98 F (36.7 C)  Recent Labs  Lab 12/09/23 1715 12/10/23 0501  WBC 8.4 8.1  CREATININE 1.67* 1.53*    Estimated Creatinine Clearance: 40 mL/min (A) (by C-G formula based on SCr of 1.53 mg/dL (H)).    Allergies  Allergen Reactions   Aspirin Other (See Comments)    Full Strength Asprin-325 mg causes upset stomach. Can take 81 mg.       Thank you for allowing pharmacy to be a part of this patient's care.  Marleta Simmer BS, PharmD, BCPS Clinical Pharmacist 12/10/2023 10:33 AM  Contact: [phone] after 3 PM  "Be curious, not judgmental..." -Rumalda Counter

## 2023-12-11 DIAGNOSIS — G9340 Encephalopathy, unspecified: Secondary | ICD-10-CM | POA: Diagnosis not present

## 2023-12-11 LAB — COMPREHENSIVE METABOLIC PANEL WITH GFR
ALT: 10 U/L (ref 0–44)
AST: 20 U/L (ref 15–41)
Albumin: 2.8 g/dL — ABNORMAL LOW (ref 3.5–5.0)
Alkaline Phosphatase: 45 U/L (ref 38–126)
Anion gap: 9 (ref 5–15)
BUN: 20 mg/dL (ref 8–23)
CO2: 27 mmol/L (ref 22–32)
Calcium: 8.5 mg/dL — ABNORMAL LOW (ref 8.9–10.3)
Chloride: 106 mmol/L (ref 98–111)
Creatinine, Ser: 1.05 mg/dL (ref 0.61–1.24)
GFR, Estimated: >60 mL/min (ref >60)
Glucose, Bld: 99 mg/dL (ref 70–99)
Potassium: 3.1 mmol/L — ABNORMAL LOW (ref 3.5–5.1)
Sodium: 142 mmol/L (ref 135–145)
Total Bilirubin: 1 mg/dL (ref 0.0–1.2)
Total Protein: 6.6 g/dL (ref 6.5–8.1)

## 2023-12-11 LAB — CBC
HCT: 40.6 % (ref 39.0–52.0)
Hemoglobin: 13.2 g/dL (ref 13.0–17.0)
MCH: 32.4 pg (ref 26.0–34.0)
MCHC: 32.5 g/dL (ref 30.0–36.0)
MCV: 99.5 fL (ref 80.0–100.0)
Platelets: 185 10*3/uL (ref 150–400)
RBC: 4.08 MIL/uL — ABNORMAL LOW (ref 4.22–5.81)
RDW: 13.6 % (ref 11.5–15.5)
WBC: 7.7 10*3/uL (ref 4.0–10.5)
nRBC: 0 % (ref 0.0–0.2)

## 2023-12-11 LAB — MAGNESIUM: Magnesium: 2 mg/dL (ref 1.7–2.4)

## 2023-12-11 MED ORDER — SODIUM CHLORIDE 0.9 % IV SOLN
3.0000 g | Freq: Four times a day (QID) | INTRAVENOUS | Status: DC
  Administered 2023-12-11 – 2023-12-12 (×5): 3 g via INTRAVENOUS
  Filled 2023-12-11 (×5): qty 8

## 2023-12-11 MED ORDER — POTASSIUM CHLORIDE 20 MEQ PO PACK
40.0000 meq | PACK | Freq: Once | ORAL | Status: AC
  Administered 2023-12-11: 40 meq via ORAL
  Filled 2023-12-11: qty 2

## 2023-12-11 MED ORDER — RIVAROXABAN 20 MG PO TABS
20.0000 mg | ORAL_TABLET | Freq: Every day | ORAL | Status: DC
  Administered 2023-12-11: 20 mg via ORAL
  Filled 2023-12-11: qty 1

## 2023-12-11 NOTE — TOC Initial Note (Addendum)
 Transition of Care [facility]) - Initial/Assessment Note    Patient Details  Name: Steven Kent MRN: [id number] Date of Birth: 09-10-1934  [facility] CM/SW Contact:    Arron Big, LCSWA Phone Number: 12/11/2023, 2:00 PM  Clinical Narrative:     Patient is from [facility]. CSW left VM for patients spouse to discuss plans for potential dc tomorrow. CSW spoke with facility admissions and they can accept patient tomorrow.   3:58 PM CSW spoke with patients spouse to update her on plans for discharge back to [facility] tomorrow. She is agreeable with this plan.   TOC will continue to follow.                Expected Discharge Plan: Skilled Nursing Facility Barriers to Discharge: Continued Medical Work up   Patient Goals and CMS Choice Patient states their goals for this hospitalization and ongoing recovery are:: Return to [facility]          Expected Discharge Plan and Services In-house Referral: Clinical Social Work     Living arrangements for the past 2 months: Skilled Holiday representative                                      Prior Living Arrangements/Services Living arrangements for the past 2 months: Skilled Nursing Facility Lives with:: Facility Resident Patient language and need for interpreter reviewed:: No Do you feel safe going back to the place where you live?: Yes      Need for Family Participation in Patient Care: Yes (Comment) Care giver support system in place?: Yes (comment)   Criminal Activity/Legal Involvement Pertinent to Current Situation/Hospitalization: No - Comment as needed  Activities of Daily Living   ADL Screening (condition at time of admission) Independently performs ADLs?: No Does the patient have a NEW difficulty with bathing/dressing/toileting/self-feeding that is expected to last >3 days?: No Does the patient have a NEW difficulty with getting in/out of bed, walking, or climbing stairs that is expected to  last >3 days?: No Does the patient have a NEW difficulty with communication that is expected to last >3 days?: No Is the patient deaf or have difficulty hearing?: Yes Does the patient have difficulty seeing, even when wearing glasses/contacts?: Yes Does the patient have difficulty concentrating, remembering, or making decisions?: Yes  Permission Sought/Granted Permission sought to share information with : Facility Medical sales representative, Family Supports Permission granted to share information with : No (Pt is from [facility], family contacts in chart)  Share Information with NAME: Chachere,Carolyn  Permission granted to share info w AGENCY: [facility]  Permission granted to share info w Relationship: (Spouse)  Permission granted to share info w Contact Information: [phone]  Emotional Assessment Appearance:: Appears stated age Attitude/Demeanor/Rapport: Unable to Assess Affect (typically observed): Unable to Assess Orientation: : Oriented to Self Alcohol / Substance Use: Not Applicable Psych Involvement: No (comment)  Admission diagnosis:  Acute encephalopathy [G93.40] AKI (acute kidney injury) (HCC) [N17.9] Patient Active Problem List   Diagnosis Date Noted   Acute encephalopathy 12/09/2023   COVID-19 08/03/2022   Influenza A 08/02/2022   COVID-19 virus infection 08/02/2022   Paroxysmal atrial fibrillation (HCC) 08/02/2022   Cognitive impairment 08/02/2022   AKI (acute kidney injury) (HCC) 08/02/2022   History of stroke 07/27/2021   History of loop recorder 07/27/2021   Wound of right leg 12/29/2020   Pressure injury of skin 12/13/2020  Cellulitis of leg, right 12/11/2020   Pulmonary vascular congestion 12/11/2020   Rhabdomyolysis 12/11/2020   Lactic acid acidosis 12/11/2020   Essential hypertension 08/16/2018   Hypertensive kidney disease with chronic kidney disease stage III 08/16/2018   VTE (venous thromboembolism) 08/16/2018   Peripheral neuropathy 08/16/2018    Glaucoma 08/16/2018   Onychomycosis 08/16/2018   Leg edema 08/16/2018   Hyperlipidemia 08/16/2018   Overactive bladder 08/16/2018   Coronary artery disease involving native heart without angina pectoris 08/16/2018   Hemiparesis affecting right side as late effect of cerebrovascular accident [facility])    Abdominal aortic aneurysm (AAA) (HCC)    PCP:  Stasia Edelman Pharmacy:   [facility] - [place], [place] - [address] [place] [address] Phone: [phone] Fax: [phone]     Social Drivers of Health (SDOH) Social History: SDOH Screenings   Food Insecurity: No Food Insecurity (12/10/2023)  Housing: Patient Unable To Answer (12/10/2023)  Transportation Needs: No Transportation Needs (12/10/2023)  Utilities: Patient Declined (12/10/2023)  Depression (PHQ2-9): Low Risk  (02/28/2020)  Financial Resource Strain: Low Risk  (02/28/2020)  Physical Activity: Inactive (02/28/2020)  Social Connections: Moderately Integrated (12/10/2023)  Stress: No Stress Concern Present (02/28/2020)  Tobacco Use: Medium Risk (12/09/2023)   SDOH Interventions:     Readmission Risk Interventions    08/04/2022   11:18 AM  Readmission Risk Prevention Plan  Post Dischage Appt Complete  Medication Screening Complete  Transportation Screening Complete

## 2023-12-11 NOTE — Progress Notes (Signed)
 PROGRESS NOTE  Steven Kent ZOX:[id number] DOB: March 12, 1935 DOA: 12/09/2023 PCP: Stasia Edelman   LOS: 1 day   Brief Narrative / Interim history: Steven Kent is a 89 y.o. male with medical history significant for hypertension, AAA, CVA with right-sided motor deficits, CAD, PAF on Xarelto , and chronic cognitive impairment who presents for evaluation of lethargy and confusion.  I spoke with his wife over the phone, she visits him regularly and he is alert, conversant, and pleasant at baseline.  He has memory issues.  Apparently the day when he was brought to the hospital he was very lethargic and poorly responsive  Subjective / 24h Interval events: He is doing well this morning, about to eat breakfast.  Denies any shortness of breath, no chest pain, no chest congestion  Assesement and Plan: Principal Problem:   Acute encephalopathy Active Problems:   Hemiparesis affecting right side as late effect of cerebrovascular accident [facility])   Essential hypertension   Coronary artery disease involving native heart without angina pectoris   Paroxysmal atrial fibrillation (HCC)   Cognitive impairment   AKI (acute kidney injury) (HCC)  Principal problem Acute kidney injury-apparent baseline less than 1, creatinine 1.6 on admission, return to normal at 1.0 this morning after fluids -Encourage p.o. intake  Active problems Pneumonia, concern for aspiration-patient with a productive cough worsening over the last week or 2.  Wife has noticed it.  Suspect a degree of aspiration especially given his unresponsive episode.  Placed on Unasyn for total of 5 days, today is day #2, will switch to Augmentin  tomorrow and plan to discharge back to SNF  Acute metabolic encephalopathy-remains confused, possibly due to dehydration acute kidney injury with decreased clearance of baclofen and gabapentin .  These medications are continued at a lower dose - Mental status close to baseline per  wife.  Dementia-continue donepezil.  Unclear baseline  PAF-continue anticoagulation with Xarelto .  He does not seem to be on rate controlling agents, heart rate in the 50s-60s  Hypokalemia-replace potassium as indicated  History of stroke-with right-sided weakness.  Continue statin, Xarelto   Essential hypertension-losartan on hold due to AKI.  He is normotensive  Scheduled Meds:  atorvastatin   40 mg Oral QHS   baclofen  5 mg Oral BID   brimonidine   1 drop Both Eyes BID   donepezil  10 mg Oral QHS   dorzolamide -timolol  1 drop Both Eyes BID   gabapentin   300 mg Oral TID   latanoprost   1 drop Both Eyes QHS   melatonin  5 mg Oral QHS   oxybutynin   10 mg Oral Daily   pneumococcal 20-valent conjugate vaccine  0.5 mL Intramuscular Tomorrow-1000   rivaroxaban   20 mg Oral QHS   Continuous Infusions:  sodium chloride  100 mL/hr at 12/10/23 2221   ampicillin-sulbactam (UNASYN) IV 3 g (12/11/23 0853)   PRN Meds:.acetaminophen  **OR** acetaminophen , prochlorperazine, senna-docusate  Current Outpatient Medications  Medication Instructions   acetaminophen  (TYLENOL ) 1,000 mg, Oral, 2 times daily   Amino Acids-Protein Hydrolys (PRO-STAT) LIQD 30 mLs, Oral, See admin instructions, Give 30 mL by mouth twice daily at 1000 and 1400.   atorvastatin  (LIPITOR) 40 MG tablet TAKE 1 TABLET(40 MG) BY MOUTH EVERY EVENING   baclofen (LIORESAL) 10 mg, 2 times daily   barrier cream (NON-SPECIFIED) CREA 1 Application, Topical, See admin instructions, Apply topically every shift to maintain skin integrity. Incontinence care.   brimonidine  (ALPHAGAN ) 0.15 % ophthalmic solution 1 drop, 2 times daily   chlorhexidine (PERIDEX) 0.12 % solution 10  mLs, Every M-W-F   donepezil (ARICEPT) 10 mg, Daily at bedtime   dorzolamide -timolol (COSOPT) 2-0.5 % ophthalmic solution 1 drop, Both Eyes, 2 times daily   gabapentin  (NEURONTIN ) 800 mg, 3 times daily   latanoprost  (XALATAN ) 0.005 % ophthalmic solution 1 drop, Daily at  bedtime   lidocaine 4 % 1 patch, See admin instructions   losartan (COZAAR) 25 mg, Daily   melatonin 5 mg, Daily at bedtime   Menthol, Topical Analgesic, 5 % GEL 1 application , Every 12 hours PRN   mineral oil-hydrophilic petrolatum (AQUAPHOR) ointment 1 Application, Topical, See admin instructions, Apply topically to legs and feet every other day, may also apply daily as needed for dry skin.   oxybutynin  (DITROPAN -XL) 10 mg, Oral, Daily   potassium chloride  SA (KLOR-CON  M) 20 MEQ tablet 20 mEq, Oral, Every other day   Sodium Fluoride 1.1 % PSTE 1 application , dental, Daily at bedtime   torsemide  (DEMADEX ) 20 mg, Daily   UNABLE TO FIND 1 Application, Mouth Rinse, Daily, "Antiseptic oral rinse"   Vitamin D , Ergocalciferol , (DRISDOL ) 1.25 MG (50000 UNIT) CAPS capsule TAKE 1 CAPSULE BY MOUTH EVERY 7 DAYS   XARELTO  20 MG TABS tablet TAKE 1 TABLET(20 MG) BY MOUTH EVERY EVENING    Diet Orders (From admission, onward)     Start     Ordered   12/09/23 2029  Diet regular Room service appropriate? Yes; Fluid consistency: Thin  Diet effective now       Question Answer Comment  Room service appropriate? Yes   Fluid consistency: Thin      12/09/23 2030            DVT prophylaxis:  rivaroxaban  (XARELTO ) tablet 20 mg   Lab Results  Component Value Date   PLT 185 12/11/2023      Code Status: Full Code  Family Communication: wife over the phone  Status is: Inpatient  Level of care: Med-Surg  Consultants:  none  Objective: Vitals:   12/10/23 0900 12/10/23 1129 12/10/23 1543 12/11/23 0331  BP:   129/66 (!) 147/77  Pulse:   (!) 50 (!) 58  Resp:   18 18  Temp:   98.5 F (36.9 C) 98.6 F (37 C)  TempSrc:   Oral   SpO2:   98% 95%  Weight: 81.6 kg 93.2 kg    Height: 6\' 2"  (1.88 m) 6\' 1"  (1.854 m)      Intake/Output Summary (Last 24 hours) at 12/11/2023 4782 Last data filed at 12/11/2023 0400 Gross per 24 hour  Intake 2640.8 ml  Output 2050 ml  Net 590.8 ml   Wt  Readings from Last 3 Encounters:  12/10/23 93.2 kg  04/05/23 81.6 kg  08/03/22 82 kg    Examination: Constitutional: NAD Eyes: lids and conjunctivae normal, no scleral icterus ENMT: mmm Neck: normal, supple Respiratory: clear to auscultation bilaterally, no wheezing, no crackles. Normal respiratory effort.  Cardiovascular: Regular rate and rhythm, no murmurs / rubs / gallops. No LE edema. Abdomen: soft, no distention, no tenderness. Bowel sounds positive.    Data Reviewed: I have independently reviewed following labs and imaging studies   CBC Recent Labs  Lab 12/09/23 1715 12/10/23 0501 12/11/23 0514  WBC 8.4 8.1 7.7  HGB 14.9 13.7 13.2  HCT 46.7 42.7 40.6  PLT 190 176 185  MCV 100.6* 100.9* 99.5  MCH 32.1 32.4 32.4  MCHC 31.9 32.1 32.5  RDW 14.2 14.0 13.6  LYMPHSABS 2.0  --   --  MONOABS 0.7  --   --   EOSABS 0.3  --   --   BASOSABS 0.0  --   --     Recent Labs  Lab 12/09/23 1715 12/10/23 0501 12/11/23 0514  NA 145 145 142  K 3.4* 3.4* 3.1*  CL 102 103 106  CO2 30 31 27   GLUCOSE 115* 106* 99  BUN 45* 40* 20  CREATININE 1.67* 1.53* 1.05  CALCIUM  9.2 9.2 8.5*  AST 20 18 20   ALT 9 9 10   ALKPHOS 57 47 45  BILITOT 1.4* 1.4* 1.0  ALBUMIN 3.8 3.2* 2.8*  MG 2.5*  --  2.0  AMMONIA <13  --   --     ------------------------------------------------------------------------------------------------------------------ No results for input(s): "CHOL", "HDL", "LDLCALC", "TRIG", "CHOLHDL", "LDLDIRECT" in the last 72 hours.  Lab Results  Component Value Date   HGBA1C 5.9 (H) 12/12/2020   ------------------------------------------------------------------------------------------------------------------ No results for input(s): "TSH", "T4TOTAL", "T3FREE", "THYROIDAB" in the last 72 hours.  Invalid input(s): "FREET3"  Cardiac Enzymes No results for input(s): "CKMB", "TROPONINI", "MYOGLOBIN" in the last 168 hours.  Invalid input(s):  "CK" ------------------------------------------------------------------------------------------------------------------    Component Value Date/Time   BNP 41.3 08/23/2019 1655    CBG: No results for input(s): "GLUCAP" in the last 168 hours.  Recent Results (from the past 240 hours)  MRSA Next Gen by PCR, Nasal     Status: None   Collection Time: 12/10/23 11:46 AM   Specimen: Nasal Mucosa; Nasal Swab  Result Value Ref Range Status   MRSA by PCR Next Gen NOT DETECTED NOT DETECTED Final    Comment: (NOTE) The GeneXpert MRSA Assay (FDA approved for NASAL specimens only), is one component of a comprehensive MRSA colonization surveillance program. It is not intended to diagnose MRSA infection nor to guide or monitor treatment for MRSA infections. Test performance is not FDA approved in patients less than 20 years old. Performed at [facility], [address], [place], [place] [address]      Radiology Studies: No results found.    Kathlen Para, MD, PhD [facility]  Between 7 am - 7 pm I am available, please contact me via Amion (for emergencies) or Securechat (non urgent messages)  Between 7 pm - 7 am I am not available, please contact night coverage MD/APP via Amion

## 2023-12-11 NOTE — Plan of Care (Signed)
   Problem: Clinical Measurements: Goal: Ability to maintain clinical measurements within normal limits will improve Outcome: Progressing Goal: Will remain free from infection Outcome: Progressing Goal: Respiratory complications will improve Outcome: Progressing Goal: Cardiovascular complication will be avoided Outcome: Progressing   Problem: Activity: Goal: Risk for activity intolerance will decrease Outcome: Progressing

## 2023-12-12 DIAGNOSIS — G9340 Encephalopathy, unspecified: Secondary | ICD-10-CM | POA: Diagnosis not present

## 2023-12-12 LAB — CBC
HCT: 44.1 % (ref 39.0–52.0)
Hemoglobin: 14.2 g/dL (ref 13.0–17.0)
MCH: 32.1 pg (ref 26.0–34.0)
MCHC: 32.2 g/dL (ref 30.0–36.0)
MCV: 99.5 fL (ref 80.0–100.0)
Platelets: 176 10*3/uL (ref 150–400)
RBC: 4.43 MIL/uL (ref 4.22–5.81)
RDW: 13.4 % (ref 11.5–15.5)
WBC: 6.4 10*3/uL (ref 4.0–10.5)
nRBC: 0 % (ref 0.0–0.2)

## 2023-12-12 LAB — BASIC METABOLIC PANEL WITH GFR
Anion gap: 10 (ref 5–15)
BUN: 15 mg/dL (ref 8–23)
CO2: 23 mmol/L (ref 22–32)
Calcium: 9 mg/dL (ref 8.9–10.3)
Chloride: 108 mmol/L (ref 98–111)
Creatinine, Ser: 1.05 mg/dL (ref 0.61–1.24)
GFR, Estimated: >60 mL/min (ref >60)
Glucose, Bld: 88 mg/dL (ref 70–99)
Potassium: 4.2 mmol/L (ref 3.5–5.1)
Sodium: 141 mmol/L (ref 135–145)

## 2023-12-12 MED ORDER — TORSEMIDE 20 MG PO TABS: 20.0000 mg | ORAL_TABLET | Freq: Every day | ORAL | Status: AC | PRN

## 2023-12-12 MED ORDER — AMOXICILLIN-POT CLAVULANATE 875-125 MG PO TABS
1.0000 | ORAL_TABLET | Freq: Two times a day (BID) | ORAL | Status: AC
Start: 2023-12-12 — End: 2023-12-15

## 2023-12-12 MED ORDER — BACLOFEN 10 MG PO TABS: 10.0000 mg | ORAL_TABLET | Freq: Two times a day (BID) | ORAL | Status: AC

## 2023-12-12 MED ORDER — GABAPENTIN 300 MG PO CAPS: 300.0000 mg | ORAL_CAPSULE | Freq: Three times a day (TID) | ORAL | Status: DC

## 2023-12-12 NOTE — TOC Transition Note (Signed)
 Transition of Care [facility]) - Discharge Note   Patient Details  Name: Steven Kent MRN: [id number] Date of Birth: 05/15/1935  Transition of Care [facility]) CM/SW Contact:  Tandy Fam, LCSW Phone Number: 12/12/2023, 10:08 AM   Clinical Narrative:   CSW sent discharge information to [facility], confirmed receipt and they are ready for patient to return. CSW notified spouse, Steven Kent, and she is in agreement. Transport arranged with [facility] for next available.  Nurse to call report to [phone], Room 409B.    Final next level of care: Skilled Nursing Facility Barriers to Discharge: Barriers Resolved   Patient Goals and CMS Choice Patient states their goals for this hospitalization and ongoing recovery are:: Return to [facility]          Discharge Placement              Patient chooses bed at: [facility] Patient to be transferred to facility by: [facility] Name of family member notified: Steven Kent Patient and family notified of of transfer: 12/12/23  Discharge Plan and Services Additional resources added to the After Visit Summary for   In-house Referral: Clinical Social Work                                   Social Drivers of Health (SDOH) Interventions SDOH Screenings   Food Insecurity: No Food Insecurity (12/10/2023)  Housing: Patient Unable To Answer (12/10/2023)  Transportation Needs: No Transportation Needs (12/10/2023)  Utilities: Patient Declined (12/10/2023)  Depression (PHQ2-9): Low Risk  (02/28/2020)  Financial Resource Strain: Low Risk  (02/28/2020)  Physical Activity: Inactive (02/28/2020)  Social Connections: Moderately Integrated (12/10/2023)  Stress: No Stress Concern Present (02/28/2020)  Tobacco Use: Medium Risk (12/09/2023)     Readmission Risk Interventions    08/04/2022   11:18 AM  Readmission Risk Prevention Plan  Post Dischage Appt Complete  Medication Screening Complete  Transportation Screening Complete

## 2023-12-12 NOTE — Discharge Summary (Signed)
 Physician Discharge Summary  Steven Kent:[id number] DOB: 1934/12/05 DOA: 12/09/2023  PCP: Stasia Edelman  Admit date: 12/09/2023 Discharge date: 12/12/2023  Admitted From: SNF Disposition:  SNF  Recommendations for Outpatient Follow-up:  Follow up with PCP in 1-2 weeks Please obtain BMP/CBC in one week Please ensure patient stays hydrated Losartan on hold due to renal failure, please monitor blood pressure reintroduce as able if blood pressure requires and kidney function is stable Diuretic changed from daily to PRN, please monitor volume status  Home Health: none Equipment/Devices: none  Discharge Condition: stable CODE STATUS: Full code Diet Orders (From admission, onward)     Start     Ordered   12/09/23 2029  Diet regular Room service appropriate? Yes; Fluid consistency: Thin  Diet effective now       Question Answer Comment  Room service appropriate? Yes   Fluid consistency: Thin      12/09/23 2030            Brief Narrative / Interim history: [name] is a 89 y.o. male with medical history significant for hypertension, AAA, CVA with right-sided motor deficits, CAD, PAF on Xarelto , and chronic cognitive impairment who presents for evaluation of lethargy and confusion.  I spoke with his wife over the phone, she visits him regularly and he is alert, conversant, and pleasant at baseline.  He has memory issues.  Apparently the day when he was brought to the hospital he was very lethargic and poorly responsive  Hospital Course / Discharge diagnoses: Principal Problem:   Acute encephalopathy Active Problems:   Hemiparesis affecting right side as late effect of cerebrovascular accident [facility])   Essential hypertension   Coronary artery disease involving native heart without angina pectoris   Paroxysmal atrial fibrillation (HCC)   Cognitive impairment   AKI (acute kidney injury) (HCC)   Principal problem Acute kidney injury-apparent baseline less  than 1, creatinine 1.6 on admission, this is likely in the setting of poor p.o. intake, ongoing diuretic use as well as ARB.  He received IV fluids, volume status appears better, he is euvolemic and creatinine has returned to 1.0.  He was maintained off IV fluids with stability in his renal function.  Would recommend holding ARB for now, and changing his diuretic from scheduled daily to PRN.  Will recommend renal function to be rechecked in the next 3 to 4 days  Active problems Pneumonia, concern for aspiration-patient with a productive cough worsening over the last week or 2.  Wife has noticed it.  Suspect a degree of aspiration especially given his unresponsive episode.  Placed on Unasyn while hospitalized, convert to Augmentin  upon discharge.  Acute metabolic encephalopathy-this is likely in the setting of underlying dementia, acute kidney injury as well as decreased renal clearance of baclofen and gabapentin .  His baclofen and gabapentin  dosing has been decreased, encephalopathy has now resolved and he is back to his baseline.  He did not seem to complain of any significant muscle spasms or neuropathic type pain, consider discontinuation as they may be unnecessary in the outpatient setting Dementia-continue donepezil.  PAF-continue anticoagulation with Xarelto .  He does not seem to be on rate controlling agents, heart rate in the 50s-60s Hypokalemia-replace potassium as indicated History of stroke-with right-sided weakness.  Continue statin, Xarelto  Essential hypertension-losartan on hold due to AKI.  He is normotensive  Sepsis ruled out   Discharge Instructions   Allergies as of 12/12/2023       Reactions   Aspirin Other (  See Comments)   Full Strength Asprin-325 mg causes upset stomach. Can take 81 mg.        Medication List     STOP taking these medications    gabapentin  800 MG tablet Commonly known as: NEURONTIN  Replaced by: gabapentin  300 MG capsule   losartan 25 MG  tablet Commonly known as: COZAAR       TAKE these medications    acetaminophen  500 MG tablet Commonly known as: TYLENOL  Take 1,000 mg by mouth 2 (two) times daily.   amoxicillin -clavulanate 875-125 MG tablet Commonly known as: AUGMENTIN  Take 1 tablet by mouth 2 (two) times daily for 3 days.   atorvastatin  40 MG tablet Commonly known as: LIPITOR TAKE 1 TABLET(40 MG) BY MOUTH EVERY EVENING What changed: See the new instructions.   baclofen 10 MG tablet Commonly known as: LIORESAL Take 1 tablet (10 mg total) by mouth 2 (two) times daily.   barrier cream Crea Commonly known as: non-specified Apply 1 Application topically See admin instructions. Apply topically every shift to maintain skin integrity. Incontinence care.   brimonidine  0.15 % ophthalmic solution Commonly known as: ALPHAGAN  Place 1 drop into both eyes 2 (two) times daily.   chlorhexidine 0.12 % solution Commonly known as: PERIDEX 10 mLs by Mouth Rinse route every Monday, Wednesday, and Friday. Rinse and spit.   donepezil 10 MG tablet Commonly known as: ARICEPT Take 10 mg by mouth at bedtime.   dorzolamide -timolol 2-0.5 % ophthalmic solution Commonly known as: COSOPT Place 1 drop into both eyes 2 (two) times daily.   gabapentin  300 MG capsule Commonly known as: NEURONTIN  Take 1 capsule (300 mg total) by mouth 3 (three) times daily. Replaces: gabapentin  800 MG tablet   latanoprost  0.005 % ophthalmic solution Commonly known as: XALATAN  Place 1 drop into both eyes at bedtime.   lidocaine 4 % Place 1 patch onto the skin See admin instructions. Apply 1 patch topically once every 24 hours as needed for pain.   melatonin 5 MG Tabs Take 5 mg by mouth at bedtime.   Menthol (Topical Analgesic) 5 % Gel Apply 1 application  topically every 12 (twelve) hours as needed (pain).   mineral oil-hydrophilic petrolatum ointment Apply 1 Application topically See admin instructions. Apply topically to legs and feet  every other day, may also apply daily as needed for dry skin.   oxybutynin  10 MG 24 hr tablet Commonly known as: DITROPAN -XL Take 10 mg by mouth daily.   potassium chloride  SA 20 MEQ tablet Commonly known as: KLOR-CON  M Take 20 mEq by mouth every other day.   Pro-Stat Liqd Take 30 mLs by mouth See admin instructions. Give 30 mL by mouth twice daily at 1000 and 1400.   Sodium Fluoride 1.1 % Pste Place 1 application  onto teeth at bedtime.   torsemide  20 MG tablet Commonly known as: DEMADEX  Take 1 tablet (20 mg total) by mouth daily as needed. What changed:  when to take this reasons to take this   UNABLE TO FIND 1 Application by Mouth Rinse route daily. "Antiseptic oral rinse"   Vitamin D  (Ergocalciferol ) 1.25 MG (50000 UNIT) Caps capsule Commonly known as: DRISDOL  TAKE 1 CAPSULE BY MOUTH EVERY 7 DAYS What changed: when to take this   Xarelto  20 MG Tabs tablet Generic drug: rivaroxaban  TAKE 1 TABLET(20 MG) BY MOUTH EVERY EVENING What changed: See the new instructions.       Consultations: none  Procedures/Studies:  US  RENAL Result Date: 12/10/2023 CLINICAL DATA:  Acute  kidney injury. EXAM: RENAL / URINARY TRACT ULTRASOUND COMPLETE COMPARISON:  None Available. FINDINGS: Right Kidney: Renal measurements: 9.8 x 6.5 x 5.5 cm = volume: 184.5 mL. Echogenicity within normal limits. No mass or hydronephrosis visualized. Left Kidney: Renal measurements: 9.9 x 5.1 x 5.0 cm = volume: 134.2 mL. Echogenicity within normal limits. No mass or hydronephrosis visualized. Bladder: Appears normal for degree of bladder distention. Bilateral ureteral jets visualized. Other: Exam detail is limited by patient body habitus and contractions involving the right upper extremity. IMPRESSION: No acute findings. No hydronephrosis. Electronically Signed   By: Kimberley Penman M.D.   On: 12/10/2023 05:50   DG Chest Port 1 View Result Date: 12/09/2023 CLINICAL DATA:  Altered mental status. EXAM: PORTABLE  CHEST 1 VIEW COMPARISON:  August 02, 2022 FINDINGS: The cardiac silhouette is mildly enlarged and unchanged in size. A radiopaque loop recorder device is seen. There is marked severity calcification of the aortic arch. Low lung volumes are noted with mild infiltrate seen within the left lung base. No pleural effusion or pneumothorax is identified. Multilevel degenerative changes are present throughout the thoracic spine. IMPRESSION: Low lung volumes with mild left basilar infiltrate. Electronically Signed   By: Virgle Grime M.D.   On: 12/09/2023 19:55   CT HEAD WO CONTRAST ( ) Result Date: 12/09/2023 CLINICAL DATA:  Mental status change, fatigue. EXAM: CT HEAD WITHOUT CONTRAST TECHNIQUE: Contiguous axial images were obtained from the base of the skull through the vertex without intravenous contrast. RADIATION DOSE REDUCTION: This exam was performed according to the departmental dose-optimization program which includes automated exposure control, adjustment of the mA and/or kV according to patient size and/or use of iterative reconstruction technique. COMPARISON:  MRI head 12/12/2020. FINDINGS: Brain: No acute intracranial hemorrhage. Hypoattenuation in the periventricular and subcortical white matter suggestive of extensive chronic microvascular ischemic changes. Remote lacunar infarcts in the bilateral basal ganglia. Remote infarct in the left thalamus which is new since 2022 MRI. No edema, mass effect, or midline shift. The basilar cisterns are patent. Ventricles: Prominence of the ventricles suggesting underlying parenchymal volume loss. Vascular: No hyperdense vessel or unexpected calcification. Skull: No acute or aggressive finding. Orbits: Orbits are symmetric. Sinuses: Mucosal thickening in the bilateral ethmoid sinuses. Complete opacification of the right frontal sinus. Additional scattered mucosal thickening and secretions in the bilateral maxillary and sphenoid sinuses. Other: Mastoid air cells  are clear. IMPRESSION: No CT evidence of acute intracranial abnormality. Extensive chronic microvascular ischemic changes. Mild parenchymal volume loss. Remote infarct in the left thalamus is new since MRI in 2022. Paranasal sinus disease as above. Electronically Signed   By: Denny Flack M.D.   On: 12/09/2023 19:44   CUP PACEART REMOTE DEVICE CHECK Result Date: 11/13/2023 ILR summary report received. Battery status OK. Normal device function. No new symptom, tachy, brady, or pause episodes. No new AF episodes. Monthly summary reports and ROV/PRN. MC, CVRS    Subjective: - no chest pain, shortness of breath, no abdominal pain, nausea or vomiting.   Discharge Exam: BP (!) 130/95 (BP Location: Right Wrist)   Pulse (!) 47   Temp 98 F (36.7 C)   Resp 18   Ht 6\' 1"  (1.854 m)   Wt 93.2 kg   SpO2 98%   BMI 27.11 kg/m   General: Pt is alert, awake, not in acute distress Cardiovascular: RRR, S1/S2 +, no rubs, no gallops Respiratory: CTA bilaterally, no wheezing, no rhonchi Abdominal: Soft, NT, ND, bowel sounds + Extremities: no edema, no cyanosis  The results of significant diagnostics from this hospitalization (including imaging, microbiology, ancillary and laboratory) are listed below for reference.     Microbiology: Recent Results (from the past 240 hours)  MRSA Next Gen by PCR, Nasal     Status: None   Collection Time: 12/10/23 11:46 AM   Specimen: Nasal Mucosa; Nasal Swab  Result Value Ref Range Status   MRSA by PCR Next Gen NOT DETECTED NOT DETECTED Final    Comment: (NOTE) The GeneXpert MRSA Assay (FDA approved for NASAL specimens only), is one component of a comprehensive MRSA colonization surveillance program. It is not intended to diagnose MRSA infection nor to guide or monitor treatment for MRSA infections. Test performance is not FDA approved in patients less than 32 years old. Performed at [facility], [address], [place], [place] [address]       Labs: Basic Metabolic Panel: Recent Labs  Lab 12/09/23 1715 12/10/23 0501 12/11/23 0514 12/12/23 0524  NA 145 145 142 141  K 3.4* 3.4* 3.1* 4.2  CL 102 103 106 108  CO2 30 31 27 23   GLUCOSE 115* 106* 99 88  BUN 45* 40* 20 15  CREATININE 1.67* 1.53* 1.05 1.05  CALCIUM  9.2 9.2 8.5* 9.0  MG 2.5*  --  2.0  --    Liver Function Tests: Recent Labs  Lab 12/09/23 1715 12/10/23 0501 12/11/23 0514  AST 20 18 20   ALT 9 9 10   ALKPHOS 57 47 45  BILITOT 1.4* 1.4* 1.0  PROT 8.4* 7.4 6.6  ALBUMIN 3.8 3.2* 2.8*   CBC: Recent Labs  Lab 12/09/23 1715 12/10/23 0501 12/11/23 0514 12/12/23 0524  WBC 8.4 8.1 7.7 6.4  NEUTROABS 5.4  --   --   --   HGB 14.9 13.7 13.2 14.2  HCT 46.7 42.7 40.6 44.1  MCV 100.6* 100.9* 99.5 99.5  PLT 190 176 185 176   CBG: No results for input(s): "GLUCAP" in the last 168 hours. Hgb A1c No results for input(s): "HGBA1C" in the last 72 hours. Lipid Profile No results for input(s): "CHOL", "HDL", "LDLCALC", "TRIG", "CHOLHDL", "LDLDIRECT" in the last 72 hours. Thyroid  function studies No results for input(s): "TSH", "T4TOTAL", "T3FREE", "THYROIDAB" in the last 72 hours.  Invalid input(s): "FREET3" Urinalysis    Component Value Date/Time   COLORURINE YELLOW 12/09/2023 1709   APPEARANCEUR CLEAR 12/09/2023 1709   LABSPEC 1.012 12/09/2023 1709   PHURINE 5.0 12/09/2023 1709   GLUCOSEU NEGATIVE 12/09/2023 1709   HGBUR NEGATIVE 12/09/2023 1709   BILIRUBINUR NEGATIVE 12/09/2023 1709   KETONESUR NEGATIVE 12/09/2023 1709   PROTEINUR NEGATIVE 12/09/2023 1709   NITRITE NEGATIVE 12/09/2023 1709   LEUKOCYTESUR NEGATIVE 12/09/2023 1709    FURTHER DISCHARGE INSTRUCTIONS:   Get Medicines reviewed and adjusted: Please take all your medications with you for your next visit with your Primary MD   Laboratory/radiological data: Please request your Primary MD to go over all hospital tests and procedure/radiological results at the follow up, please ask your  Primary MD to get all Hospital records sent to his/her office.   In some cases, they will be blood work, cultures and biopsy results pending at the time of your discharge. Please request that your primary care M.D. goes through all the records of your hospital data and follows up on these results.   Also Note the following: If you experience worsening of your admission symptoms, develop shortness of breath, life threatening emergency, suicidal or homicidal thoughts you must seek medical attention immediately  by calling 911 or calling your MD immediately  if symptoms less severe.   You must read complete instructions/literature along with all the possible adverse reactions/side effects for all the Medicines you take and that have been prescribed to you. Take any new Medicines after you have completely understood and accpet all the possible adverse reactions/side effects.    Do not drive when taking Pain medications or sleeping medications (Benzodaizepines)   Do not take more than prescribed Pain, Sleep and Anxiety Medications. It is not advisable to combine anxiety,sleep and pain medications without talking with your primary care practitioner   Special Instructions: If you have smoked or chewed Tobacco  in the last 2 yrs please stop smoking, stop any regular Alcohol  and or any Recreational drug use.   Wear Seat belts while driving.   Please note: You were cared for by a hospitalist during your hospital stay. Once you are discharged, your primary care physician will handle any further medical issues. Please note that NO REFILLS for any discharge medications will be authorized once you are discharged, as it is imperative that you return to your primary care physician (or establish a relationship with a primary care physician if you do not have one) for your post hospital discharge needs so that they can reassess your need for medications and monitor your lab values.  Time coordinating discharge: 40  minutes  SIGNED:  Kathlen Para, MD, PhD 12/12/2023, 9:00 AM

## 2023-12-12 NOTE — Progress Notes (Signed)
 AVS in packet for facility

## 2023-12-12 NOTE — NC FL2 (Signed)
 [place]  MEDICAID FL2 LEVEL OF CARE FORM     IDENTIFICATION  Patient Name: Steven Kent Birthdate: 1934/12/15 Sex: male Admission Date (Current Location): 12/09/2023  [facility] and [place] Number:  Producer, television/film/video and Address:  [facility], [address] [place], [place] [address]      Provider Number: [id number]  Attending Physician Name and Address:  Osborn Blaze, MD  Relative Name and Phone Number:       Current Level of Care: Hospital Recommended Level of Care: Skilled Nursing Facility Prior Approval Number:    Date Approved/Denied:   PASRR Number:    Discharge Plan: SNF    Current Diagnoses: Patient Active Problem List   Diagnosis Date Noted   Acute encephalopathy 12/09/2023   COVID-19 08/03/2022   Influenza A 08/02/2022   COVID-19 virus infection 08/02/2022   Paroxysmal atrial fibrillation (HCC) 08/02/2022   Cognitive impairment 08/02/2022   AKI (acute kidney injury) (HCC) 08/02/2022   History of stroke 07/27/2021   History of loop recorder 07/27/2021   Wound of right leg 12/29/2020   Pressure injury of skin 12/13/2020   Cellulitis of leg, right 12/11/2020   Pulmonary vascular congestion 12/11/2020   Rhabdomyolysis 12/11/2020   Lactic acid acidosis 12/11/2020   Essential hypertension 08/16/2018   Hypertensive kidney disease with chronic kidney disease stage III 08/16/2018   VTE (venous thromboembolism) 08/16/2018   Peripheral neuropathy 08/16/2018   Glaucoma 08/16/2018   Onychomycosis 08/16/2018   Leg edema 08/16/2018   Hyperlipidemia 08/16/2018   Overactive bladder 08/16/2018   Coronary artery disease involving native heart without angina pectoris 08/16/2018   Hemiparesis affecting right side as late effect of cerebrovascular accident [facility])    Abdominal aortic aneurysm (AAA) (HCC)     Orientation RESPIRATION BLADDER Height & Weight     Self, Place  Normal Incontinent Weight: 205 lb 7.5 oz (93.2  kg) Height:  6\' 1"  (185.4 cm)  BEHAVIORAL SYMPTOMS/MOOD NEUROLOGICAL BOWEL NUTRITION STATUS      Incontinent Diet (regular)  AMBULATORY STATUS COMMUNICATION OF NEEDS Skin   Extensive Assist Verbally Normal                       Personal Care Assistance Level of Assistance  Bathing, Feeding, Dressing Bathing Assistance: Maximum assistance Feeding assistance: Limited assistance Dressing Assistance: Maximum assistance     Functional Limitations Info  Sight, Hearing Sight Info: Impaired (blind right eye) Hearing Info: Impaired      SPECIAL CARE FACTORS FREQUENCY                       Contractures Contractures Info: Not present    Additional Factors Info  Code Status, Allergies, Psychotropic Code Status Info: Full Allergies Info: Aspirin Psychotropic Info: Aricept 10mg  daily at bed         Current Medications (12/12/2023):  This is the current hospital active medication list Current Facility-Administered Medications  Medication Dose Route Frequency Provider Last Rate Last Admin   acetaminophen  (TYLENOL ) tablet 650 mg  650 mg Oral Q6H PRN Opyd, Timothy S, MD   650 mg at 12/11/23 2138   Or   acetaminophen  (TYLENOL ) suppository 650 mg  650 mg Rectal Q6H PRN Opyd, Timothy S, MD       Ampicillin-Sulbactam (UNASYN) 3 g in sodium chloride  0.9 % 100 mL IVPB  3 g Intravenous Q6H Gherghe, Costin M, MD 200 mL/hr at 12/12/23 0852 3 g at 12/12/23  2952   atorvastatin  (LIPITOR) tablet 40 mg  40 mg Oral QHS Opyd, Timothy S, MD   40 mg at 12/11/23 2136   baclofen (LIORESAL) tablet 5 mg  5 mg Oral BID Opyd, Timothy S, MD   5 mg at 12/12/23 0847   brimonidine  (ALPHAGAN ) 0.15 % ophthalmic solution 1 drop  1 drop Both Eyes BID Opyd, Santana Cue, MD   1 drop at 12/12/23 0848   donepezil (ARICEPT) tablet 10 mg  10 mg Oral QHS Opyd, Timothy S, MD   10 mg at 12/11/23 2137   dorzolamide -timolol (COSOPT) 2-0.5 % ophthalmic solution 1 drop  1 drop Both Eyes BID Opyd, Timothy S, MD   1 drop at  12/12/23 0848   gabapentin  (NEURONTIN ) capsule 300 mg  300 mg Oral TID Opyd, Timothy S, MD   300 mg at 12/12/23 0848   latanoprost  (XALATAN ) 0.005 % ophthalmic solution 1 drop  1 drop Both Eyes QHS Opyd, Timothy S, MD   1 drop at 12/11/23 2140   melatonin tablet 5 mg  5 mg Oral QHS Opyd, Timothy S, MD   5 mg at 12/11/23 2136   oxybutynin  (DITROPAN -XL) 24 hr tablet 10 mg  10 mg Oral Daily Opyd, Timothy S, MD   10 mg at 12/12/23 0847   pneumococcal 20-valent conjugate vaccine (PREVNAR 20) injection 0.5 mL  0.5 mL Intramuscular Tomorrow-1000 Gherghe, Costin M, MD       prochlorperazine (COMPAZINE) injection 5 mg  5 mg Intravenous Q6H PRN Opyd, Santana Cue, MD       rivaroxaban  (XARELTO ) tablet 20 mg  20 mg Oral QHS Gherghe, Costin M, MD   20 mg at 12/11/23 2136   senna-docusate (Senokot-S) tablet 1 tablet  1 tablet Oral QHS PRN Opyd, Timothy S, MD         Discharge Medications: Please see discharge summary for a list of discharge medications.  Relevant Imaging Results:  Relevant Lab Results:   Additional Information SS#: [id number]  Tandy Fam, LCSW

## 2023-12-12 NOTE — Plan of Care (Signed)

## 2023-12-18 ENCOUNTER — Ambulatory Visit (INDEPENDENT_AMBULATORY_CARE_PROVIDER_SITE_OTHER): Payer: Medicare Other

## 2023-12-18 DIAGNOSIS — R55 Syncope and collapse: Secondary | ICD-10-CM | POA: Diagnosis not present

## 2023-12-18 LAB — CUP PACEART REMOTE DEVICE CHECK
Date Time Interrogation Session: 20250518230955
Implantable Pulse Generator Implant Date: 20211027

## 2023-12-24 ENCOUNTER — Emergency Department (HOSPITAL_COMMUNITY)

## 2023-12-24 ENCOUNTER — Inpatient Hospital Stay (HOSPITAL_COMMUNITY)
Admission: EM | Admit: 2023-12-24 | Discharge: 2023-12-29 | DRG: 871 | Disposition: A | Discharge status: Skilled Nursing Facility | Source: Skilled Nursing Facility | Attending: Family Medicine | Admitting: Family Medicine

## 2023-12-24 ENCOUNTER — Encounter (HOSPITAL_COMMUNITY): Payer: Self-pay

## 2023-12-24 ENCOUNTER — Other Ambulatory Visit: Payer: Self-pay

## 2023-12-24 DIAGNOSIS — I480 Paroxysmal atrial fibrillation: Secondary | ICD-10-CM | POA: Diagnosis present

## 2023-12-24 DIAGNOSIS — Z79899 Other long term (current) drug therapy: Secondary | ICD-10-CM

## 2023-12-24 DIAGNOSIS — Z7901 Long term (current) use of anticoagulants: Secondary | ICD-10-CM | POA: Diagnosis not present

## 2023-12-24 DIAGNOSIS — F0390 Unspecified dementia without behavioral disturbance: Secondary | ICD-10-CM | POA: Diagnosis present

## 2023-12-24 DIAGNOSIS — N3091 Cystitis, unspecified with hematuria: Secondary | ICD-10-CM | POA: Diagnosis present

## 2023-12-24 DIAGNOSIS — G9341 Metabolic encephalopathy: Secondary | ICD-10-CM | POA: Diagnosis present

## 2023-12-24 DIAGNOSIS — L89616 Pressure-induced deep tissue damage of right heel: Secondary | ICD-10-CM | POA: Diagnosis present

## 2023-12-24 DIAGNOSIS — R319 Hematuria, unspecified: Secondary | ICD-10-CM | POA: Diagnosis not present

## 2023-12-24 DIAGNOSIS — R6510 Systemic inflammatory response syndrome (SIRS) of non-infectious origin without acute organ dysfunction: Secondary | ICD-10-CM | POA: Diagnosis present

## 2023-12-24 DIAGNOSIS — Z1152 Encounter for screening for COVID-19: Secondary | ICD-10-CM

## 2023-12-24 DIAGNOSIS — N5089 Other specified disorders of the male genital organs: Secondary | ICD-10-CM | POA: Diagnosis present

## 2023-12-24 DIAGNOSIS — H409 Unspecified glaucoma: Secondary | ICD-10-CM | POA: Diagnosis present

## 2023-12-24 DIAGNOSIS — N12 Tubulo-interstitial nephritis, not specified as acute or chronic: Secondary | ICD-10-CM | POA: Diagnosis not present

## 2023-12-24 DIAGNOSIS — I2510 Atherosclerotic heart disease of native coronary artery without angina pectoris: Secondary | ICD-10-CM | POA: Diagnosis present

## 2023-12-24 DIAGNOSIS — F0280 Dementia in other diseases classified elsewhere without behavioral disturbance: Secondary | ICD-10-CM | POA: Diagnosis not present

## 2023-12-24 DIAGNOSIS — R6520 Severe sepsis without septic shock: Secondary | ICD-10-CM | POA: Diagnosis present

## 2023-12-24 DIAGNOSIS — I10 Essential (primary) hypertension: Secondary | ICD-10-CM | POA: Diagnosis present

## 2023-12-24 DIAGNOSIS — G9340 Encephalopathy, unspecified: Secondary | ICD-10-CM | POA: Diagnosis not present

## 2023-12-24 DIAGNOSIS — N390 Urinary tract infection, site not specified: Secondary | ICD-10-CM | POA: Diagnosis present

## 2023-12-24 DIAGNOSIS — Z87891 Personal history of nicotine dependence: Secondary | ICD-10-CM | POA: Diagnosis not present

## 2023-12-24 DIAGNOSIS — Z8042 Family history of malignant neoplasm of prostate: Secondary | ICD-10-CM | POA: Diagnosis not present

## 2023-12-24 DIAGNOSIS — I69351 Hemiplegia and hemiparesis following cerebral infarction affecting right dominant side: Secondary | ICD-10-CM

## 2023-12-24 DIAGNOSIS — Z886 Allergy status to analgesic agent status: Secondary | ICD-10-CM | POA: Diagnosis not present

## 2023-12-24 DIAGNOSIS — A419 Sepsis, unspecified organism: Principal | ICD-10-CM | POA: Diagnosis present

## 2023-12-24 DIAGNOSIS — I7143 Infrarenal abdominal aortic aneurysm, without rupture: Secondary | ICD-10-CM | POA: Diagnosis present

## 2023-12-24 DIAGNOSIS — L8990 Pressure ulcer of unspecified site, unspecified stage: Secondary | ICD-10-CM | POA: Diagnosis present

## 2023-12-24 DIAGNOSIS — R008 Other abnormalities of heart beat: Secondary | ICD-10-CM | POA: Diagnosis not present

## 2023-12-24 LAB — URINALYSIS, W/ REFLEX TO CULTURE (INFECTION SUSPECTED)
Bilirubin Urine: NEGATIVE
Glucose, UA: NEGATIVE mg/dL
Ketones, ur: 5 mg/dL — AB
Nitrite: NEGATIVE
Protein, ur: 100 mg/dL — AB
RBC / HPF: >50 RBC/hpf (ref 0–5)
Specific Gravity, Urine: 1.023 (ref 1.005–1.030)
pH: 6 (ref 5.0–8.0)

## 2023-12-24 LAB — CBC WITH DIFFERENTIAL/PLATELET
Abs Immature Granulocytes: 0.06 10*3/uL (ref 0.00–0.07)
Basophils Absolute: 0 10*3/uL (ref 0.0–0.1)
Basophils Relative: 0 %
Eosinophils Absolute: 0.1 10*3/uL (ref 0.0–0.5)
Eosinophils Relative: 1 %
HCT: 45.3 % (ref 39.0–52.0)
Hemoglobin: 14.8 g/dL (ref 13.0–17.0)
Immature Granulocytes: 1 %
Lymphocytes Relative: 13 %
Lymphs Abs: 1.6 10*3/uL (ref 0.7–4.0)
MCH: 32.2 pg (ref 26.0–34.0)
MCHC: 32.7 g/dL (ref 30.0–36.0)
MCV: 98.7 fL (ref 80.0–100.0)
Monocytes Absolute: 0.8 10*3/uL (ref 0.1–1.0)
Monocytes Relative: 7 %
Neutro Abs: 9.1 10*3/uL — ABNORMAL HIGH (ref 1.7–7.7)
Neutrophils Relative %: 78 %
Platelets: 171 10*3/uL (ref 150–400)
RBC: 4.59 MIL/uL (ref 4.22–5.81)
RDW: 14.1 % (ref 11.5–15.5)
WBC: 11.7 10*3/uL — ABNORMAL HIGH (ref 4.0–10.5)
nRBC: 0 % (ref 0.0–0.2)

## 2023-12-24 LAB — COMPREHENSIVE METABOLIC PANEL WITH GFR
ALT: 13 U/L (ref 0–44)
AST: 21 U/L (ref 15–41)
Albumin: 3.6 g/dL (ref 3.5–5.0)
Alkaline Phosphatase: 67 U/L (ref 38–126)
Anion gap: 13 (ref 5–15)
BUN: 15 mg/dL (ref 8–23)
CO2: 21 mmol/L — ABNORMAL LOW (ref 22–32)
Calcium: 9.1 mg/dL (ref 8.9–10.3)
Chloride: 106 mmol/L (ref 98–111)
Creatinine, Ser: 1.04 mg/dL (ref 0.61–1.24)
GFR, Estimated: >60 mL/min (ref >60)
Glucose, Bld: 113 mg/dL — ABNORMAL HIGH (ref 70–99)
Potassium: 3.8 mmol/L (ref 3.5–5.1)
Sodium: 140 mmol/L (ref 135–145)
Total Bilirubin: 2 mg/dL — ABNORMAL HIGH (ref 0.0–1.2)
Total Protein: 7.8 g/dL (ref 6.5–8.1)

## 2023-12-24 LAB — CK: Total CK: 109 U/L (ref 49–397)

## 2023-12-24 LAB — PROTIME-INR
INR: 3 — ABNORMAL HIGH (ref 0.8–1.2)
Prothrombin Time: 31.3 s — ABNORMAL HIGH (ref 11.4–15.2)

## 2023-12-24 LAB — RESP PANEL BY RT-PCR (RSV, FLU A&B, COVID)  RVPGX2
Influenza A by PCR: NEGATIVE
Influenza B by PCR: NEGATIVE
Resp Syncytial Virus by PCR: NEGATIVE
SARS Coronavirus 2 by RT PCR: NEGATIVE

## 2023-12-24 LAB — I-STAT CG4 LACTIC ACID, ED
Lactic Acid, Venous: 1.8 mmol/L (ref 0.5–1.9)
Lactic Acid, Venous: 1.5 mmol/L (ref 0.5–1.9)

## 2023-12-24 MED ORDER — BACLOFEN 10 MG PO TABS
10.0000 mg | ORAL_TABLET | Freq: Two times a day (BID) | ORAL | Status: DC
  Administered 2023-12-24 – 2023-12-29 (×10): 10 mg via ORAL
  Filled 2023-12-24 (×10): qty 1

## 2023-12-24 MED ORDER — SODIUM CHLORIDE 0.9 % IV SOLN
1.0000 g | Freq: Once | INTRAVENOUS | Status: AC
  Administered 2023-12-24: 1 g via INTRAVENOUS
  Filled 2023-12-24: qty 10

## 2023-12-24 MED ORDER — POLYETHYLENE GLYCOL 3350 17 G PO PACK: 17.0000 g | PACK | Freq: Every day | ORAL | Status: DC | PRN

## 2023-12-24 MED ORDER — SODIUM CHLORIDE 0.9 % IV SOLN: Freq: Once | INTRAVENOUS | Status: AC

## 2023-12-24 MED ORDER — MELATONIN 5 MG PO TABS
5.0000 mg | ORAL_TABLET | Freq: Every day | ORAL | Status: DC
  Administered 2023-12-24 – 2023-12-28 (×5): 5 mg via ORAL
  Filled 2023-12-24 (×5): qty 1

## 2023-12-24 MED ORDER — ONDANSETRON HCL 4 MG PO TABS: 4.0000 mg | ORAL_TABLET | Freq: Four times a day (QID) | ORAL | Status: DC | PRN

## 2023-12-24 MED ORDER — SODIUM CHLORIDE 0.9 % IV BOLUS
1000.0000 mL | Freq: Once | INTRAVENOUS | Status: AC
  Administered 2023-12-24: 1000 mL via INTRAVENOUS

## 2023-12-24 MED ORDER — RIVAROXABAN 20 MG PO TABS: 20.0000 mg | ORAL_TABLET | Freq: Every day | ORAL | Status: DC

## 2023-12-24 MED ORDER — ACETAMINOPHEN 325 MG PO TABS
650.0000 mg | ORAL_TABLET | Freq: Four times a day (QID) | ORAL | Status: DC | PRN
  Administered 2023-12-25 – 2023-12-29 (×5): 650 mg via ORAL
  Filled 2023-12-24 (×5): qty 2

## 2023-12-24 MED ORDER — SENNA 8.6 MG PO TABS
1.0000 | ORAL_TABLET | Freq: Two times a day (BID) | ORAL | Status: DC
  Administered 2023-12-24 – 2023-12-26 (×5): 8.6 mg via ORAL
  Filled 2023-12-24 (×6): qty 1

## 2023-12-24 MED ORDER — SODIUM CHLORIDE 0.9 % IV SOLN
1.0000 g | INTRAVENOUS | Status: DC
  Administered 2023-12-25 – 2023-12-29 (×5): 1 g via INTRAVENOUS
  Filled 2023-12-24 (×5): qty 10

## 2023-12-24 MED ORDER — ACETAMINOPHEN 650 MG RE SUPP: 650.0000 mg | Freq: Four times a day (QID) | RECTAL | Status: DC | PRN

## 2023-12-24 MED ORDER — VITAMIN D (ERGOCALCIFEROL) 1.25 MG (50000 UNIT) PO CAPS: 50000.0000 [IU] | ORAL_CAPSULE | ORAL | Status: DC

## 2023-12-24 MED ORDER — BRIMONIDINE TARTRATE 0.15 % OP SOLN
1.0000 [drp] | Freq: Two times a day (BID) | OPHTHALMIC | Status: DC
  Administered 2023-12-24 – 2023-12-29 (×10): 1 [drp] via OPHTHALMIC
  Filled 2023-12-24: qty 5

## 2023-12-24 MED ORDER — ATORVASTATIN CALCIUM 40 MG PO TABS
40.0000 mg | ORAL_TABLET | Freq: Every day | ORAL | Status: DC
  Administered 2023-12-24 – 2023-12-28 (×5): 40 mg via ORAL
  Filled 2023-12-24 (×4): qty 1

## 2023-12-24 MED ORDER — DONEPEZIL HCL 10 MG PO TABS
10.0000 mg | ORAL_TABLET | Freq: Every day | ORAL | Status: DC
  Administered 2023-12-24 – 2023-12-25 (×2): 10 mg via ORAL
  Filled 2023-12-24 (×2): qty 1

## 2023-12-24 MED ORDER — LACTATED RINGERS IV SOLN: INTRAVENOUS | Status: AC

## 2023-12-24 MED ORDER — DORZOLAMIDE HCL-TIMOLOL MAL 2-0.5 % OP SOLN
1.0000 [drp] | Freq: Two times a day (BID) | OPHTHALMIC | Status: DC
  Administered 2023-12-24 – 2023-12-29 (×10): 1 [drp] via OPHTHALMIC
  Filled 2023-12-24: qty 10

## 2023-12-24 MED ORDER — GABAPENTIN 300 MG PO CAPS
300.0000 mg | ORAL_CAPSULE | Freq: Three times a day (TID) | ORAL | Status: DC
  Administered 2023-12-24 – 2023-12-28 (×11): 300 mg via ORAL
  Filled 2023-12-24 (×11): qty 1

## 2023-12-24 MED ORDER — LATANOPROST 0.005 % OP SOLN
1.0000 [drp] | Freq: Every day | OPHTHALMIC | Status: DC
  Administered 2023-12-24 – 2023-12-28 (×5): 1 [drp] via OPHTHALMIC
  Filled 2023-12-24: qty 2.5

## 2023-12-24 MED ORDER — ONDANSETRON HCL 4 MG/2ML IJ SOLN: 4.0000 mg | Freq: Four times a day (QID) | INTRAMUSCULAR | Status: DC | PRN

## 2023-12-24 NOTE — Consult Note (Signed)
 WOC Nurse Consult Note: Reason for Consult: R scrotal wound  Wound type: 1  Full thickness ? R/t moisture and friction vs pressure  2.  Deep tissue Pressure Injury R heel  Pressure Injury POA: Yes Measurement: see nursing flowsheet  Wound bed: 1.  Two separate areas to scrotum separated by intact skin; appear 50% pink 50% yellow  2.  R medial heel purple maroon discoloration with some dry peeling skin on top  Drainage (amount, consistency, odor) see nursing flowsheet  Periwound: hyperkeratotic skin to heel  Dressing procedure/placement/frequency:  Clean scrotum with soap and water, dry and apply small piece of Xeroform gauze to open areas daily, cover with silicone foam or dry gauze/ABD pad and attempt to secure with mesh underwear. Cleanse R heel with soap and water, dry and apply Xeroform gauze [employer]m Foot [phone]) to wound bed daily, cover with silicone foam or dry gauze and Kerlix roll guaze. Place R foot in Prevalon boot to offload pressure [employer]m Foot [phone]).  POC discussed with bedside nurse. WOC team will not follow. Re-consult if further needs arise.   Thank you,     Ronni Colace MSN, RN-BC, [employer] [phone]

## 2023-12-24 NOTE — Progress Notes (Signed)
 Patient admitted to room 1424. Patient had a large, incontinent BM. Urine is very bloody, with large clots also- MD Rai made aware. Order to hold Xarelto .

## 2023-12-24 NOTE — ED Provider Notes (Signed)
  EMERGENCY DEPARTMENT AT [facility] Provider Note   CSN: [id number] Arrival date & time: 12/24/23  1016     History Chief Complaint  Patient presents with   Fever    Steven Kent is a 89 y.o. male with h/o AAA, HTN, stroke with right side deficit, dementia presents to the ER from [place] [facility] facility for fever. The patient reports that his body hurts, but no other reported symptoms. He denies cough, dysuria, abdominal pain, chest pain, or SOB. When asking where in his body, he reports "every where". He is unable to quantify how long this has been going on for. He does not know why he is here.   I have called [facility] and spoke with his nurse, Genia Kettering. She reports that his temp was 102.73F around 0730 today. She gave him Tylenol  650mg . Carolin Chyle was he was ok, but today was too tired to sit up and eat breakfast. She has not noted a cough or other symptoms. She reports that he was like this the last time when he was in the hospital.    Fever      Home Medications Prior to Admission medications   Medication Sig Start Date End Date Taking? Authorizing Provider  acetaminophen  (TYLENOL ) 500 MG tablet Take 1,000 mg by mouth 2 (two) times daily.    [provider]  Amino Acids-Protein Hydrolys (PRO-STAT) LIQD Take 30 mLs by mouth See admin instructions. Give 30 mL by mouth twice daily at 1000 and 1400.    [provider]  atorvastatin  (LIPITOR) 40 MG tablet TAKE 1 TABLET(40 MG) BY MOUTH EVERY EVENING Patient taking differently: Take 40 mg by mouth at bedtime. 09/07/20   Rodney Clamp, MD  baclofen  (LIORESAL ) 10 MG tablet Take 1 tablet (10 mg total) by mouth 2 (two) times daily. 12/12/23   Gherghe, Costin M, MD  barrier cream (NON-SPECIFIED) CREA Apply 1 Application topically See admin instructions. Apply topically every shift to maintain skin integrity. Incontinence care.    [provider]  brimonidine  (ALPHAGAN )  0.15 % ophthalmic solution Place 1 drop into both eyes 2 (two) times daily.    [provider]  chlorhexidine (PERIDEX) 0.12 % solution 10 mLs by Mouth Rinse route every Monday, Wednesday, and Friday. Rinse and spit.    [provider]  donepezil  (ARICEPT ) 10 MG tablet Take 10 mg by mouth at bedtime.    [provider]  dorzolamide -timolol  (COSOPT ) 2-0.5 % ophthalmic solution Place 1 drop into both eyes 2 (two) times daily.    [provider]  gabapentin  (NEURONTIN ) 300 MG capsule Take 1 capsule (300 mg total) by mouth 3 (three) times daily. 12/12/23   Gherghe, Costin M, MD  latanoprost  (XALATAN ) 0.005 % ophthalmic solution Place 1 drop into both eyes at bedtime.    [provider]  lidocaine 4 % Place 1 patch onto the skin See admin instructions. Apply 1 patch topically once every 24 hours as needed for pain.    [provider]  melatonin 5 MG TABS Take 5 mg by mouth at bedtime.    [provider]  Menthol, Topical Analgesic, 5 % GEL Apply 1 application  topically every 12 (twelve) hours as needed (pain).    [provider]  mineral oil-hydrophilic petrolatum (AQUAPHOR) ointment Apply 1 Application topically See admin instructions. Apply topically to legs and feet every other day, may also apply daily as needed for dry skin.    [provider]  oxybutynin  (DITROPAN -XL) 10 MG 24 hr tablet Take 10 mg by mouth daily.    [provider]  potassium chloride  SA (KLOR-CON  M) 20 MEQ tablet Take 20 mEq by mouth every other day.    [provider]  Sodium Fluoride 1.1 % PSTE Place 1 application  onto teeth at bedtime.    [provider]  torsemide  (DEMADEX ) 20 MG tablet Take 1 tablet (20 mg total) by mouth daily as needed. 12/12/23   Gherghe, Costin M, MD  UNABLE TO FIND 1 Application by Mouth Rinse route daily. "Antiseptic oral rinse"    [provider]  Vitamin D , Ergocalciferol , (DRISDOL ) 1.25  MG (50000 UNIT) CAPS capsule TAKE 1 CAPSULE BY MOUTH EVERY 7 DAYS Patient taking differently: Take 50,000 Units by mouth every Sunday. 12/21/20   Rodney Clamp, MD  XARELTO  20 MG TABS tablet TAKE 1 TABLET(20 MG) BY MOUTH EVERY EVENING Patient taking differently: Take 20 mg by mouth at bedtime. 09/07/20   Rodney Clamp, MD      Allergies    Aspirin    Review of Systems   Review of Systems  Unable to perform ROS: Dementia  Constitutional:  Positive for fever.    Physical Exam Updated Vital Signs BP (!) 161/86   Pulse 86   Temp 99.1 F (37.3 C) (Rectal)   Resp 15   SpO2 98%  Physical Exam Vitals and nursing note reviewed. Exam conducted with a chaperone present.  Constitutional:      General: He is not in acute distress.    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 Left pupil 3-74mm and reactive to light.  Cardiovascular:     Rate and Rhythm: Normal rate.  Pulmonary:     Effort: Pulmonary effort is normal. No respiratory distress.  Abdominal:     Palpations: Abdomen is soft.     Tenderness: There is no abdominal tenderness. There is no guarding or rebound.  Skin:    General: Skin is warm and dry.     Comments: Significant dry, flaky skin, mainly to the lower bilateral extremities and feet. No wound or ulcerations not to the feet, toes, or web spaces. No increase in warmth or erythema noted. The patient does have two small stage 1 areas of skin breakdown in the right inguinal fold and the scrotal sack. No tenderness, induration, or fluctuance noted to the perineum. No other ulceration or sores noted.   Neurological:     Mental Status: He is alert. Mental status is at baseline.     Comments: Right sided upper and lower extremity contracture and weakness.      ED Results / Procedures / Treatments   Labs (all labs ordered are listed, but only abnormal  results are displayed) Labs Reviewed  COMPREHENSIVE METABOLIC PANEL WITH GFR - Abnormal; Notable for the following components:      Result Value   CO2 21 (*)    Glucose, Bld 113 (*)    Total Bilirubin 2.0 (*)    All other components within normal limits  CBC WITH DIFFERENTIAL/PLATELET - Abnormal; Notable for the following components:   WBC 11.7 (*)    Neutro Abs 9.1 (*)    All other components within normal limits  PROTIME-INR - Abnormal; Notable for the following components:   Prothrombin Time 31.3 (*)    INR 3.0 (*)  All other components within normal limits  URINALYSIS, W/ REFLEX TO CULTURE (INFECTION SUSPECTED) - Abnormal; Notable for the following components:   APPearance HAZY (*)    Hgb urine dipstick LARGE (*)    Ketones, ur 5 (*)    Protein, ur 100 (*)    Leukocytes,Ua MODERATE (*)    Bacteria, UA RARE (*)    Non Squamous Epithelial 0-5 (*)    All other components within normal limits  RESP PANEL BY RT-PCR (RSV, FLU A&B, COVID)  RVPGX2  CULTURE, BLOOD (ROUTINE X 2)  URINE CULTURE  CULTURE, BLOOD (ROUTINE X 2) W REFLEX TO ID PANEL  CK  COMPREHENSIVE METABOLIC PANEL WITH GFR  CBC  PROCALCITONIN  I-STAT CG4 LACTIC ACID, ED  I-STAT CG4 LACTIC ACID, ED    EKG None  Radiology DG Chest 2 View Result Date: 12/24/2023 CLINICAL DATA:  Suspected sepsis. EXAM: CHEST - 2 VIEW COMPARISON:  12/09/2023 FINDINGS: Patient's arms overlie the lateral film. Lungs are hypoinflated without focal airspace consolidation or effusion. Mild stable cardiomegaly. Loop recorder projects over the left heart. Remainder of the exam is unchanged. IMPRESSION: 1. No active cardiopulmonary disease. 2. Mild stable cardiomegaly. Electronically Signed   By: Roda Cirri M.D.   On: 12/24/2023 11:48    Procedures Procedures   Medications Ordered in ED Medications  sodium chloride  0.9 % bolus 1,000 mL (has no administration in time range)    ED Course/ Medical Decision Making/ A&P                                Medical Decision Making Amount and/or Complexity of Data Reviewed Labs: ordered. Radiology: ordered.  Risk Decision regarding hospitalization.   89 y.o. male presents to the ER for evaluation of fever. Differential diagnosis includes but is not limited to viral illness, pneumonia, UTI, sepsis, SIRS. Vital signs blood pressure 151/86, temperature 99.1, pulse 86, respiratory rate 15 with 98% on room air. Physical exam as noted above.   Tylenol  was given to him at the facility prior to arrival.  Will order sepsis labs with chest x-ray for source of infection.  Viral panel ordered as well.  The patient does not appear in any acute distress.  Have ordered some fluids.  I independently reviewed and interpreted the patient's labs.  CBC shows white count 11.7 with slight left shift.  No anemia.  PT/INR 31.8 and 3.0 respectively.  CMP shows bicarb of 21, glucose of 113, total bili at 2.0 otherwise no other electrolyte or LFT abnormalities.  CK within normal limits.  COVID, flu, RSV negative.'s lactic acid within normal limits.  Urinalysis was consistent with infection with hazy urine with large methemoglobin present.  5 ketones, 100 protein, moderate leuks present with greater than 50 red blood cells, 21-50 white blood cells and red bacteria present.  Urine culture pending.  Blood cultures pending.  CXR  1. No active cardiopulmonary disease. 2. Mild stable cardiomegaly. Per radiologist's interpretation.    I have ordered the patient Rocephin  given his likely source being urine.  He is afebrile here however did receive Tylenol .  He has borderline SIRS criteria with a white count of 11.7, INR 3.0 as well as a total bili of 2.0.  Given his age as well as risk factors, will admit him for observation for antibiotics and continued trend of labs and vital signs.  Admit to [facility] hospitalist.  I discussed this case with my attending  physician who cosigned this note including patient's presenting  symptoms, physical exam, and planned diagnostics and interventions. Attending physician stated agreement with plan or made changes to plan which were implemented.   Portions of this report may have been transcribed using voice recognition software. Every effort was made to ensure accuracy; however, inadvertent computerized transcription errors may be present.    Final Clinical Impression(s) / ED Diagnoses Final diagnoses:  Pyelonephritis    Rx / DC Orders ED Discharge Orders     None         Spence Dux, Kirby Peoples 12/24/23 2204    Iva Mariner, MD 12/25/23 [phone]

## 2023-12-24 NOTE — H&P (Addendum)
 History and Physical  Patient: Steven Kent ZOX:[id number] DOB: 06/20/1935 DOA: 12/24/2023 DOS: the patient was seen and examined on 12/24/2023 Patient coming from: SNF  Chief Complaint:  Chief Complaint  Patient presents with   Fever   HPI: Steven Kent is a 89 y.o. male with PMH significant of HTN, AAA, CVA with right-sided weakness, CAD, paroxysmal A-fib on Xarelto , dementia chronic cognitive impairment with recent hospitalization 12/09/2023-12/12/2023 for AKI, acute metabolic encephalopathy (concern for aspiration pneumonia, was discharged to SNF on Augmentin ).   Patient presented from the [facility] facility with fevers, 102 F at 7:30 AM this morning and received Tylenol .  Patient had reported that his" whole body" hurts but no other symptoms including cough, dysuria, abdominal pain, chest pain or shortness of breath.  Per the nursing staff at the facility, he was too tired to sit up and eat breakfast otherwise no acute symptoms.  Patient has dementia and unable to provide review of systems he does not know why he is in the hospital.   ED course:  In ED, temp 99.1 F, RR 11-15, BP 160/95, O2 sats 98% on room air UA positive for UTI CMET unremarkable, lactic acid 1.5.  WBCs 11.7, hemoglobin 14.8, INR 3.0  Chest x-ray showed no active cardiopulmonary disease, mild stable cardiomegaly  RSV, flu, COVID-negative  Review of Systems: As mentioned in the history of present illness. All other systems reviewed and are negative.  Difficult to obtain from the patient due to his mental status    Past Medical History:  Diagnosis Date   Abdominal aortic aneurysm (AAA) (HCC)    Hypertension    Stroke [facility])    Past Surgical History:  Procedure Laterality Date   CATARACT EXTRACTION, BILATERAL     rt eye 10/09/2014 lt eye 11/06/2014   DENTAL SURGERY     REFRACTIVE SURGERY  01/2015   Social History:  reports that he has quit smoking. He has never used smokeless tobacco. He reports  that he does not drink alcohol and does not use drugs. Allergies  Allergen Reactions   Aspirin Other (See Comments)    Full Strength Asprin-325 mg causes upset stomach. Can take 81 mg.    Family History  Problem Relation Age of Onset   Prostate cancer Neg Hx    Prior to Admission medications   Medication Sig Start Date End Date Taking? Authorizing Provider  acetaminophen  (TYLENOL ) 500 MG tablet Take 1,000 mg by mouth 2 (two) times daily.    [provider]  Amino Acids-Protein Hydrolys (PRO-STAT) LIQD Take 30 mLs by mouth See admin instructions. Give 30 mL by mouth twice daily at 1000 and 1400.    [provider]  atorvastatin  (LIPITOR) 40 MG tablet TAKE 1 TABLET(40 MG) BY MOUTH EVERY EVENING Patient taking differently: Take 40 mg by mouth at bedtime. 09/07/20   Rodney Clamp, MD  baclofen  (LIORESAL ) 10 MG tablet Take 1 tablet (10 mg total) by mouth 2 (two) times daily. 12/12/23   Gherghe, Costin M, MD  barrier cream (NON-SPECIFIED) CREA Apply 1 Application topically See admin instructions. Apply topically every shift to maintain skin integrity. Incontinence care.    [provider]  brimonidine  (ALPHAGAN ) 0.15 % ophthalmic solution Place 1 drop into both eyes 2 (two) times daily.    [provider]  chlorhexidine (PERIDEX) 0.12 % solution 10 mLs by Mouth Rinse route every Monday, Wednesday, and Friday. Rinse and spit.    [provider]  donepezil  (ARICEPT ) 10 MG tablet  Take 10 mg by mouth at bedtime.    [provider]  dorzolamide -timolol  (COSOPT ) 2-0.5 % ophthalmic solution Place 1 drop into both eyes 2 (two) times daily.    [provider]  gabapentin  (NEURONTIN ) 300 MG capsule Take 1 capsule (300 mg total) by mouth 3 (three) times daily. 12/12/23   Gherghe, Costin M, MD  latanoprost  (XALATAN ) 0.005 % ophthalmic solution Place 1 drop into both eyes at bedtime.    [provider]  lidocaine 4 % Place 1 patch onto the  skin See admin instructions. Apply 1 patch topically once every 24 hours as needed for pain.    [provider]  melatonin 5 MG TABS Take 5 mg by mouth at bedtime.    [provider]  Menthol, Topical Analgesic, 5 % GEL Apply 1 application  topically every 12 (twelve) hours as needed (pain).    [provider]  mineral oil-hydrophilic petrolatum (AQUAPHOR) ointment Apply 1 Application topically See admin instructions. Apply topically to legs and feet every other day, may also apply daily as needed for dry skin.    [provider]  oxybutynin  (DITROPAN -XL) 10 MG 24 hr tablet Take 10 mg by mouth daily.    [provider]  potassium chloride  SA (KLOR-CON  M) 20 MEQ tablet Take 20 mEq by mouth every other day.    [provider]  Sodium Fluoride 1.1 % PSTE Place 1 application  onto teeth at bedtime.    [provider]  torsemide  (DEMADEX ) 20 MG tablet Take 1 tablet (20 mg total) by mouth daily as needed. 12/12/23   Gherghe, Costin M, MD  UNABLE TO FIND 1 Application by Mouth Rinse route daily. "Antiseptic oral rinse"    [provider]  Vitamin D , Ergocalciferol , (DRISDOL ) 1.25 MG (50000 UNIT) CAPS capsule TAKE 1 CAPSULE BY MOUTH EVERY 7 DAYS Patient taking differently: Take 50,000 Units by mouth every Sunday. 12/21/20   Rodney Clamp, MD  XARELTO  20 MG TABS tablet TAKE 1 TABLET(20 MG) BY MOUTH EVERY EVENING Patient taking differently: Take 20 mg by mouth at bedtime. 09/07/20   Rodney Clamp, MD   Physical Exam: Vitals:   12/24/23 1100 12/24/23 1129 12/24/23 1200 12/24/23 1640  BP: (!) 160/95  (!) 161/78 (!) 148/98  Pulse: 86  68 76  Resp: 11  12 18   Temp:    98.6 F (37 C)  TempSrc:    Oral  SpO2: 98%  99% 99%  Weight:  93.2 kg    Height:  6\' 1"  (1.854 m)       General: Alert, awake, oriented x to self, NAD, not oriented to place or time, chronically ill-appearing Eyes: pink conjunctiva, anicteric sclera, PERLA HEENT:  normocephalic, atraumatic, oropharynx clear Neck: supple, no masses or lymphadenopathy, no JVD CVS: Regular rate and rhythm, no murmurs, rubs or gallops. Resp : Clear to auscultation bilaterally, no wheezing, rales or rhonchi. GI : Soft, nontender, nondistended, positive bowel sounds. No hepatomegaly.  Ext: No lower extremity edema  Musculoskeletal: No clubbing or cyanosis, positive pedal pulses. No contracture. ROM intact  Neuro: chronic right-sided deficits from previous stroke Psych: alert and oriented x 3, normal mood and affect Skin: Stage I areas of skin breakdown in the right inguinal fold and scrotum   Data Reviewed: I have reviewed ED notes, Vitals, Lab results and outpatient records.   Recent Labs  Lab 12/24/23 1030  NA 140  K 3.8  CL 106  CO2 21*  GLUCOSE  113*  BUN 15  CREATININE 1.04  CALCIUM  9.1   Recent Labs  Lab 12/24/23 1030  WBC 11.7*  NEUTROABS 9.1*  HGB 14.8  HCT 45.3  MCV 98.7  PLT 171    Assessment and Plan Principal Problem:   SIRS (systemic inflammatory response syndrome) (HCC) secondary to UTI, acute metabolic encephalopathy - Patient met SIRS criteria with fevers, mild tachypnea, leukocytosis, acute encephalopathy (superimposed on dementia), secondary to UTI - Obtain blood cultures, urine culture -Placed on IV Rocephin , gentle hydration   Active Problems: Acute metabolic encephalopathy superimposed on dementia - Continue donepezil , treat UTI as above - Delirium precautions, fall precautions, aspiration precautions - During previous admission gabapentin  was decreased to 300 mg 3 times daily, will continue    Hemiparesis affecting right side as late effect of cerebrovascular accident (HCC) -Continue Lipitor - Hold xarelto  due to hematuria   History of glaucoma - Continue eyedrops including Alphagan , Cosopt , Xalatan     Essential hypertension -BP currently stable, hold torsemide  for now    Pressure injury of skin -Right inguinal  fold, scrotum -Wound care consult    Paroxysmal atrial fibrillation (HCC) - Rate controlled, - hold xarelto    Hematuria - Likely due to UTI and anticoagulation - HOLD xarelto      Advance Care Planning:   Code Status: Full Code  Consults: None Family Communication: No family member at the bedside Severity of Illness:      The appropriate patient status for this patient is INPATIENT. Inpatient status is judged to be reasonable and necessary in order to provide the required intensity of service to ensure the patient's safety. The patient's presenting symptoms, physical exam findings, and initial radiographic and laboratory data in the context of their chronic comorbidities is felt to place them at high risk for further clinical deterioration. Furthermore, it is not anticipated that the patient will be medically stable for discharge from the hospital within 2 midnights of admission.   * I certify that at the point of admission it is my clinical judgment that the patient will require inpatient hospital care spanning beyond 2 midnights from the point of admission due to high intensity of service, high risk for further deterioration and high frequency of surveillance required.*    Author: Bertram Brocks, MD 12/24/2023 4:45 PM For on call review [email or web address].

## 2023-12-24 NOTE — ED Triage Notes (Addendum)
 Pt BIBA from facility for Fever of unknown source.  Per EMS/Staff pt has fever of 102 oral.    Pt has Hx of pneumonia,cognitive impairment  and right sided weakness related to hx of stroke  BP 170/100   97% room air Rr 20 CBG 101 HR 86

## 2023-12-24 NOTE — ED Notes (Addendum)
 On arrival pt had BM, when cleaning pt.3 areas of stage 1 areas of breakdown on scrotum, `1cm diameter. Applied barrier cream to prevent further skin breakdown

## 2023-12-25 ENCOUNTER — Ambulatory Visit: Payer: Self-pay | Admitting: Cardiovascular Disease

## 2023-12-25 DIAGNOSIS — R6510 Systemic inflammatory response syndrome (SIRS) of non-infectious origin without acute organ dysfunction: Secondary | ICD-10-CM

## 2023-12-25 DIAGNOSIS — I10 Essential (primary) hypertension: Secondary | ICD-10-CM | POA: Diagnosis not present

## 2023-12-25 DIAGNOSIS — N12 Tubulo-interstitial nephritis, not specified as acute or chronic: Secondary | ICD-10-CM | POA: Diagnosis not present

## 2023-12-25 DIAGNOSIS — G9340 Encephalopathy, unspecified: Secondary | ICD-10-CM

## 2023-12-25 DIAGNOSIS — I69351 Hemiplegia and hemiparesis following cerebral infarction affecting right dominant side: Secondary | ICD-10-CM

## 2023-12-25 LAB — COMPREHENSIVE METABOLIC PANEL WITH GFR
ALT: 9 U/L (ref 0–44)
AST: 18 U/L (ref 15–41)
Albumin: 3.1 g/dL — ABNORMAL LOW (ref 3.5–5.0)
Alkaline Phosphatase: 59 U/L (ref 38–126)
Anion gap: 8 (ref 5–15)
BUN: 10 mg/dL (ref 8–23)
CO2: 24 mmol/L (ref 22–32)
Calcium: 8.4 mg/dL — ABNORMAL LOW (ref 8.9–10.3)
Chloride: 105 mmol/L (ref 98–111)
Creatinine, Ser: 0.81 mg/dL (ref 0.61–1.24)
GFR, Estimated: >60 mL/min (ref >60)
Glucose, Bld: 98 mg/dL (ref 70–99)
Potassium: 3.5 mmol/L (ref 3.5–5.1)
Sodium: 137 mmol/L (ref 135–145)
Total Bilirubin: 1.5 mg/dL — ABNORMAL HIGH (ref 0.0–1.2)
Total Protein: 6.9 g/dL (ref 6.5–8.1)

## 2023-12-25 LAB — BLOOD CULTURE ID PANEL (REFLEXED) - BCID2

## 2023-12-25 LAB — CBC
HCT: 42.5 % (ref 39.0–52.0)
Hemoglobin: 13.3 g/dL (ref 13.0–17.0)
MCH: 31.8 pg (ref 26.0–34.0)
MCHC: 31.3 g/dL (ref 30.0–36.0)
MCV: 101.7 fL — ABNORMAL HIGH (ref 80.0–100.0)
Platelets: 154 10*3/uL (ref 150–400)
RBC: 4.18 MIL/uL — ABNORMAL LOW (ref 4.22–5.81)
RDW: 13.9 % (ref 11.5–15.5)
WBC: 10.1 10*3/uL (ref 4.0–10.5)
nRBC: 0 % (ref 0.0–0.2)

## 2023-12-25 LAB — URINE CULTURE: Culture: NO GROWTH

## 2023-12-25 LAB — PROCALCITONIN: Procalcitonin: 0.31 ng/mL

## 2023-12-25 NOTE — Progress Notes (Signed)
 PHARMACY - PHYSICIAN COMMUNICATION CRITICAL VALUE ALERT - BLOOD CULTURE IDENTIFICATION (BCID)  Steven Kent is an 89 y.o. male who presented to [facility] on 12/24/2023 with a chief complaint of  Chief Complaint  Patient presents with   Fever     Assessment:  1/2 anaerobic GPC - staph epi per BCID, mecA+   Name of physician (or Provider) Contacted:  Dr. Thelma Fire   Current antibiotics: ceftriaxone   Changes to prescribed antibiotics recommended:  - Culture likely contaminant - Continue ceftriaxone  for UTI coverage   Results for orders placed or performed during the hospital encounter of 12/24/23  Blood Culture ID Panel (Reflexed) (Collected: 12/24/2023 11:22 AM)  Result Value Ref Range   Enterococcus faecalis NOT DETECTED NOT DETECTED   Enterococcus Faecium NOT DETECTED NOT DETECTED   Listeria monocytogenes NOT DETECTED NOT DETECTED   Staphylococcus species DETECTED (A) NOT DETECTED   Staphylococcus aureus (BCID) NOT DETECTED NOT DETECTED   Staphylococcus epidermidis DETECTED (A) NOT DETECTED   Staphylococcus lugdunensis NOT DETECTED NOT DETECTED   Streptococcus species NOT DETECTED NOT DETECTED   Streptococcus agalactiae NOT DETECTED NOT DETECTED   Streptococcus pneumoniae NOT DETECTED NOT DETECTED   Streptococcus pyogenes NOT DETECTED NOT DETECTED   A.calcoaceticus-baumannii NOT DETECTED NOT DETECTED   Bacteroides fragilis NOT DETECTED NOT DETECTED   Enterobacterales NOT DETECTED NOT DETECTED   Enterobacter cloacae complex NOT DETECTED NOT DETECTED   Escherichia coli NOT DETECTED NOT DETECTED   Klebsiella aerogenes NOT DETECTED NOT DETECTED   Klebsiella oxytoca NOT DETECTED NOT DETECTED   Klebsiella pneumoniae NOT DETECTED NOT DETECTED   Proteus species NOT DETECTED NOT DETECTED   Salmonella species NOT DETECTED NOT DETECTED   Serratia marcescens NOT DETECTED NOT DETECTED   Haemophilus influenzae NOT DETECTED NOT DETECTED   Neisseria meningitidis NOT DETECTED NOT  DETECTED   Pseudomonas aeruginosa NOT DETECTED NOT DETECTED   Stenotrophomonas maltophilia NOT DETECTED NOT DETECTED   Candida albicans NOT DETECTED NOT DETECTED   Candida auris NOT DETECTED NOT DETECTED   Candida glabrata NOT DETECTED NOT DETECTED   Candida krusei NOT DETECTED NOT DETECTED   Candida parapsilosis NOT DETECTED NOT DETECTED   Candida tropicalis NOT DETECTED NOT DETECTED   Cryptococcus neoformans/gattii NOT DETECTED NOT DETECTED   Methicillin resistance mecA/C DETECTED (A) NOT DETECTED     Van Gelinas, PharmD, BCPS 12/25/2023 8:28 AM

## 2023-12-25 NOTE — Progress Notes (Addendum)
 [facility]                                                                              Steven Kent, is a 89 y.o. male, DOB - 01-21-1935, XBJ:[id number] Admit date - 12/24/2023    Outpatient Primary MD for the patient is [facility]  LOS - 1  days  Chief Complaint  Patient presents with   Fever       Brief summary   Patient is a 89 y.o. male with PMH significant of HTN, AAA, CVA with right-sided weakness, CAD, paroxysmal A-fib on Xarelto , dementia chronic cognitive impairment with recent hospitalization 12/09/2023-12/12/2023 for AKI, acute metabolic encephalopathy (concern for aspiration pneumonia, was discharged to SNF on Augmentin ).    Patient presented from the [place] facility with fevers, 102 F at 7:30 AM and received Tylenol .  Patient had reported that his" whole body" hurts but no other symptoms including cough, dysuria, abdominal pain, chest pain or shortness of breath.  Per the nursing staff at the facility, he was too tired to sit up and eat breakfast otherwise no acute symptoms.  Patient has dementia and unable to provide review of systems he does not know why he is in the hospital.  Patient noted to have hematuria.   Assessment & Plan    Severe sepsis secondary to UTI, acute metabolic encephalopathy - Patient met SIRS criteria with fevers, mild tachypnea, leukocytosis, acute encephalopathy (superimposed on dementia), secondary to UTI - BC ID + staph species, Staph epidermidis, methicillin resistant coagulase-negative staph, likely contaminant  - Follow urine culture and sensitivities, continue IV Rocephin      Active Problems: Acute metabolic encephalopathy superimposed on dementia - Continue donepezil , treat UTI as above - Delirium precautions, fall precautions, aspiration precautions - Continue gabapentin  300 mg 3 times daily  Hematuria - In the setting of UTI, on Xarelto - -hold Xarelto , continue IV fluids if no significant improvement,  will consult urology - H&H stable    Hemiparesis affecting right side as late effect of cerebrovascular accident (HCC) - Continue Lipitor - Hold xarelto  due to hematuria    History of glaucoma - Continue eyedrops including Alphagan , Cosopt , Xalatan      Essential hypertension - BP stable     Pressure injury of skin -Right inguinal fold, scrotum -Wound care consulted     Paroxysmal atrial fibrillation (HCC) - Rate controlled, - hold xarelto    Right deep tissue heel pressure injury - POA - Wound care per nursing  Estimated body mass index is 27.11 kg/m as calculated from the following:   Height as of this encounter: 6\' 1"  (1.854 m).   Weight as of this encounter: 93.2 kg.  Code Status: Full CODE STATUS DVT Prophylaxis:  Place and maintain sequential compression device Start: 12/24/23 1716   Level of Care: Level of care: Progressive Family Communication: Updated patient Disposition Plan:      Remains inpatient appropriate:      Procedures:  None  Consultants:   None  Antimicrobials:   Anti-infectives (From admission, onward)    Start     Dose/Rate Route Frequency Ordered Stop   12/25/23 1200  cefTRIAXone  (ROCEPHIN ) 1 g  in sodium chloride  0.9 % 100 mL IVPB        1 g 200 mL/hr over 30 Minutes Intravenous Every 24 hours 12/24/23 1658     12/24/23 1145  cefTRIAXone  (ROCEPHIN ) 1 g in sodium chloride  0.9 % 100 mL IVPB        1 g 200 mL/hr over 30 Minutes Intravenous  Once 12/24/23 1135 12/24/23 1249          Medications  atorvastatin   40 mg Oral QHS   baclofen   10 mg Oral BID   brimonidine   1 drop Both Eyes BID   donepezil   10 mg Oral QHS   dorzolamide -timolol   1 drop Both Eyes BID   gabapentin   300 mg Oral TID   latanoprost   1 drop Both Eyes QHS   melatonin  5 mg Oral QHS   senna  1 tablet Oral BID   [START ON 12/31/2023] Vitamin D  (Ergocalciferol )  50,000 Units Oral Q Sun      Subjective:   Steven Kent was seen and examined today.  Hematuria  noted, on IV fluids, Xarelto  on hold.  Alert and awake, oriented to self and place.  No acute nausea vomiting, abdominal pain, chest pain or shortness of breath Objective:   Vitals:   12/24/23 1200 12/24/23 1640 12/24/23 2133 12/25/23 0436  BP: (!) 161/78 (!) 148/98 (!) 157/89 (!) 160/66  Pulse: 68 76 79 65  Resp: 12 18 18 18   Temp:  98.6 F (37 C) 98.2 F (36.8 C) 99 F (37.2 C)  TempSrc:  Oral Oral Oral  SpO2: 99% 99% 98% 96%  Weight:      Height:        Intake/Output Summary (Last 24 hours) at 12/25/2023 0938 Last data filed at 12/25/2023 1610 Gross per 24 hour  Intake 1787.15 ml  Output 600 ml  Net 1187.15 ml     Wt Readings from Last 3 Encounters:  12/24/23 93.2 kg  12/10/23 93.2 kg  04/05/23 81.6 kg   Physical Exam General: Alert and oriented x self and place , NAD, ill-appearing Cardiovascular: S1 S2 clear, RRR.  Respiratory: CTAB, no wheezing, rales or rhonchi Gastrointestinal: Soft, nontender, nondistended, NBS Ext: no pedal edema bilaterally Neuro: chronic right-sided deficits from previous stroke Psych:  dementia     Data Reviewed:  I have personally reviewed following labs    CBC Lab Results  Component Value Date   WBC 10.1 12/25/2023   RBC 4.18 (L) 12/25/2023   HGB 13.3 12/25/2023   HCT 42.5 12/25/2023   MCV 101.7 (H) 12/25/2023   MCH 31.8 12/25/2023   PLT 154 12/25/2023   MCHC 31.3 12/25/2023   RDW 13.9 12/25/2023   LYMPHSABS 1.6 12/24/2023   MONOABS 0.8 12/24/2023   EOSABS 0.1 12/24/2023   BASOSABS 0.0 12/24/2023     Last metabolic panel Lab Results  Component Value Date   NA 140 12/24/2023   K 3.8 12/24/2023   CL 106 12/24/2023   CO2 21 (L) 12/24/2023   BUN 15 12/24/2023   CREATININE 1.04 12/24/2023   GLUCOSE 113 (H) 12/24/2023   GFRNONAA >60 12/24/2023   GFRAA 48 (L) 03/18/2020   CALCIUM  9.1 12/24/2023   PHOS 2.2 (L) 08/04/2022   PROT 7.8 12/24/2023   ALBUMIN 3.6 12/24/2023   BILITOT 2.0 (H) 12/24/2023   ALKPHOS 67  12/24/2023   AST 21 12/24/2023   ALT 13 12/24/2023   ANIONGAP 13 12/24/2023    CBG (last 3)  No results for  input(s): "GLUCAP" in the last 72 hours.    Coagulation Profile: Recent Labs  Lab 12/24/23 1030  INR 3.0*     Radiology Studies: I have personally reviewed the imaging studies  DG Chest 2 View Result Date: 12/24/2023 CLINICAL DATA:  Suspected sepsis. EXAM: CHEST - 2 VIEW COMPARISON:  12/09/2023 FINDINGS: Patient's arms overlie the lateral film. Lungs are hypoinflated without focal airspace consolidation or effusion. Mild stable cardiomegaly. Loop recorder projects over the left heart. Remainder of the exam is unchanged. IMPRESSION: 1. No active cardiopulmonary disease. 2. Mild stable cardiomegaly. Electronically Signed   By: Roda Cirri M.D.   On: 12/24/2023 11:48       Lannette Avellino M.D. [facility] 12/25/2023, 9:38 AM  Available via Epic secure chat 7am-7pm After 7 pm, please refer to night coverage provider listed on amion.

## 2023-12-26 ENCOUNTER — Inpatient Hospital Stay (HOSPITAL_COMMUNITY)

## 2023-12-26 DIAGNOSIS — R6510 Systemic inflammatory response syndrome (SIRS) of non-infectious origin without acute organ dysfunction: Secondary | ICD-10-CM | POA: Diagnosis not present

## 2023-12-26 DIAGNOSIS — G9340 Encephalopathy, unspecified: Secondary | ICD-10-CM | POA: Diagnosis not present

## 2023-12-26 DIAGNOSIS — I480 Paroxysmal atrial fibrillation: Secondary | ICD-10-CM

## 2023-12-26 DIAGNOSIS — I69351 Hemiplegia and hemiparesis following cerebral infarction affecting right dominant side: Secondary | ICD-10-CM | POA: Diagnosis not present

## 2023-12-26 DIAGNOSIS — I10 Essential (primary) hypertension: Secondary | ICD-10-CM | POA: Diagnosis not present

## 2023-12-26 LAB — CBC
HCT: 48 % (ref 39.0–52.0)
Hemoglobin: 14.9 g/dL (ref 13.0–17.0)
MCH: 32 pg (ref 26.0–34.0)
MCHC: 31 g/dL (ref 30.0–36.0)
MCV: 103 fL — ABNORMAL HIGH (ref 80.0–100.0)
Platelets: 105 10*3/uL — ABNORMAL LOW (ref 150–400)
RBC: 4.66 MIL/uL (ref 4.22–5.81)
RDW: 14 % (ref 11.5–15.5)
WBC: 7.4 10*3/uL (ref 4.0–10.5)
nRBC: 0 % (ref 0.0–0.2)

## 2023-12-26 LAB — BASIC METABOLIC PANEL WITH GFR
Anion gap: 11 (ref 5–15)
BUN: 10 mg/dL (ref 8–23)
CO2: 24 mmol/L (ref 22–32)
Calcium: 8 mg/dL — ABNORMAL LOW (ref 8.9–10.3)
Chloride: 103 mmol/L (ref 98–111)
Creatinine, Ser: 1.12 mg/dL (ref 0.61–1.24)
GFR, Estimated: >60 mL/min (ref >60)
Glucose, Bld: 106 mg/dL — ABNORMAL HIGH (ref 70–99)
Potassium: 3.2 mmol/L — ABNORMAL LOW (ref 3.5–5.1)
Sodium: 138 mmol/L (ref 135–145)

## 2023-12-26 LAB — PROCALCITONIN: Procalcitonin: 6.16 ng/mL

## 2023-12-26 LAB — CULTURE, BLOOD (ROUTINE X 2): Special Requests: ADEQUATE

## 2023-12-26 MED ORDER — LACTATED RINGERS IV SOLN: INTRAVENOUS | Status: AC

## 2023-12-26 NOTE — Plan of Care (Signed)

## 2023-12-26 NOTE — [place] FL2 (Signed)
 [place]  MEDICAID FL2 LEVEL OF CARE FORM     IDENTIFICATION  Patient Name: Steven Kent Birthdate: [birth year] Sex: male Admission Date (Current Location): 12/24/2023  [facility] and [place] Number:  Producer, television/film/video and Address:  [facility],  [address], [place] [address]      Provider Number: [id number]  Attending Physician Name and Address:  Loma Rising, MD  Relative Name and Phone Number:  Jaylene Schrom (Spouse)  [phone]    Current Level of Care: Hospital Recommended Level of Care: Nursing Facility Prior Approval Number:    Date Approved/Denied:   PASRR Number:    Discharge Plan: SNF    Current Diagnoses: Patient Active Problem List   Diagnosis Date Noted   Sepsis (HCC) 12/24/2023   SIRS (systemic inflammatory response syndrome) (HCC) 12/24/2023   UTI (urinary tract infection) 12/24/2023   Acute encephalopathy 12/09/2023   COVID-19 08/03/2022   Influenza A 08/02/2022   COVID-19 virus infection 08/02/2022   Paroxysmal atrial fibrillation (HCC) 08/02/2022   Cognitive impairment 08/02/2022   AKI (acute kidney injury) (HCC) 08/02/2022   History of stroke 07/27/2021   History of loop recorder 07/27/2021   Wound of right leg 12/29/2020   Pressure injury of skin 12/13/2020   Cellulitis of leg, right 12/11/2020   Pulmonary vascular congestion 12/11/2020   Rhabdomyolysis 12/11/2020   Lactic acid acidosis 12/11/2020   Essential hypertension 08/16/2018   Hypertensive kidney disease with chronic kidney disease stage III 08/16/2018   VTE (venous thromboembolism) 08/16/2018   Peripheral neuropathy 08/16/2018   Glaucoma 08/16/2018   Onychomycosis 08/16/2018   Leg edema 08/16/2018   Hyperlipidemia 08/16/2018   Overactive bladder 08/16/2018   Coronary artery disease involving native heart without angina pectoris 08/16/2018   Hemiparesis affecting right side as late effect of cerebrovascular accident [facility])    Abdominal aortic  aneurysm (AAA) (HCC)     Orientation RESPIRATION BLADDER Height & Weight     Self  Normal Incontinent Weight: 205 lb 7.5 oz (93.2 kg) Height:  6\' 1"  (185.4 cm)  BEHAVIORAL SYMPTOMS/MOOD NEUROLOGICAL BOWEL NUTRITION STATUS      Incontinent Diet (Heart healthy diet)  AMBULATORY STATUS COMMUNICATION OF NEEDS Skin   Extensive Assist Verbally Other (Comment), Skin abrasions (Abrasion: scrotum; Erythema: buttocks)                       Personal Care Assistance Level of Assistance  Bathing, Feeding, Dressing Bathing Assistance: Maximum assistance Feeding assistance: Limited assistance Dressing Assistance: Maximum assistance     Functional Limitations Info  Sight, Hearing, Speech Sight Info: Impaired Hearing Info: Impaired Speech Info: Adequate    SPECIAL CARE FACTORS FREQUENCY                       Contractures Contractures Info: Not present    Additional Factors Info  Code Status Code Status Info: Full Allergies Info: Aspirin Psychotropic Info: See discharge summary         Current Medications (12/26/2023):  This is the current hospital active medication list Current Facility-Administered Medications  Medication Dose Route Frequency Provider Last Rate Last Admin   acetaminophen  (TYLENOL ) tablet 650 mg  650 mg Oral Q6H PRN Rai, Ripudeep K, MD   650 mg at 12/25/23 1954   Or   acetaminophen  (TYLENOL ) suppository 650 mg  650 mg Rectal Q6H PRN Rai, Ripudeep K, MD       atorvastatin  (LIPITOR) tablet 40 mg  40  mg Oral QHS Rai, Ripudeep K, MD   40 mg at 12/25/23 2204   baclofen  (LIORESAL ) tablet 10 mg  10 mg Oral BID Rai, Ripudeep K, MD   10 mg at 12/26/23 1610   brimonidine  (ALPHAGAN ) 0.15 % ophthalmic solution 1 drop  1 drop Both Eyes BID Rai, Ripudeep K, MD   1 drop at 12/26/23 1000   cefTRIAXone  (ROCEPHIN ) 1 g in sodium chloride  0.9 % 100 mL IVPB  1 g Intravenous Q24H Rai, Ripudeep K, MD 200 mL/hr at 12/25/23 1142 1 g at 12/25/23 1142   donepezil  (ARICEPT ) tablet  10 mg  10 mg Oral QHS Rai, Ripudeep K, MD   10 mg at 12/25/23 2204   dorzolamide -timolol  (COSOPT ) 2-0.5 % ophthalmic solution 1 drop  1 drop Both Eyes BID Rai, Ripudeep K, MD   1 drop at 12/26/23 1000   gabapentin  (NEURONTIN ) capsule 300 mg  300 mg Oral TID Rai, Ripudeep K, MD   300 mg at 12/26/23 9604   lactated ringers  infusion   Intravenous Continuous Rai, Ripudeep K, MD 75 mL/hr at 12/26/23 0827 New Bag at 12/26/23 0827   latanoprost  (XALATAN ) 0.005 % ophthalmic solution 1 drop  1 drop Both Eyes QHS Rai, Ripudeep K, MD   1 drop at 12/25/23 2208   melatonin tablet 5 mg  5 mg Oral QHS Rai, Ripudeep K, MD   5 mg at 12/25/23 2204   ondansetron  (ZOFRAN ) tablet 4 mg  4 mg Oral Q6H PRN Rai, Ripudeep K, MD       Or   ondansetron  (ZOFRAN ) injection 4 mg  4 mg Intravenous Q6H PRN Rai, Ripudeep K, MD       polyethylene glycol (MIRALAX / GLYCOLAX) packet 17 g  17 g Oral Daily PRN Rai, Ripudeep K, MD       senna (SENOKOT) tablet 8.6 mg  1 tablet Oral BID Rai, Ripudeep K, MD   8.6 mg at 12/26/23 0958   [START ON 12/31/2023] Vitamin D  (Ergocalciferol ) (DRISDOL ) 1.25 MG (50000 UNIT) capsule 50,000 Units  50,000 Units Oral Q Christiana Cower, Ripudeep K, MD         Discharge Medications: Please see discharge summary for a list of discharge medications.  Relevant Imaging Results:  Relevant Lab Results:   Additional Information SSN: [id number]  Zenon Hilda, LCSW

## 2023-12-26 NOTE — TOC Initial Note (Signed)
 Transition of Care [facility]) - Initial/Assessment Note   Patient Details  Name: Steven Kent MRN: [id number] Date of Birth: 1935-06-08  [facility] of [facility]) CM/SW Contact:    Zenon Hilda, LCSW Phone Number: 12/26/2023, 11:36 AM  Clinical Narrative: Patient is from [facility] and is currently oriented to self only. CSW spoke with patient's spouse, Katrell Milhorn, to complete assessment. Wife confirmed patient is a LTC resident of [facility] and the patient will return there after discharge. Patient will need PTAR at discharge. CSW confirmed patient will return with Crystal in admissions at [facility]. FL2 completed. TOC to follow.  Expected Discharge Plan: Long Term Nursing Home Barriers to Discharge: Continued Medical Work up  Patient Goals and CMS Choice Patient states their goals for this hospitalization and ongoing recovery are:: Return to [facility] LTC Choice offered to / list presented to : NA  Expected Discharge Plan and Services In-house Referral: Clinical Social Work Post Acute Care Choice: Nursing Home Living arrangements for the past 2 months: Skilled Nursing Facility             DME Arranged: N/A DME Agency: NA  Prior Living Arrangements/Services Living arrangements for the past 2 months: Skilled Nursing Facility Lives with:: Facility Resident Patient language and need for interpreter reviewed:: Yes Do you feel safe going back to the place where you live?: Yes      Need for Family Participation in Patient Care: Yes (Comment) (Patient currently oriented to self only.) Care giver support system in place?: Yes (comment) Criminal Activity/Legal Involvement Pertinent to Current Situation/Hospitalization: No - Comment as needed  Activities of Daily Living ADL Screening (condition at time of admission) Independently performs ADLs?: No Does the patient have a NEW difficulty with bathing/dressing/toileting/self-feeding that is expected to last >3 days?: No Does the  patient have a NEW difficulty with getting in/out of bed, walking, or climbing stairs that is expected to last >3 days?: No Does the patient have a NEW difficulty with communication that is expected to last >3 days?: No Is the patient deaf or have difficulty hearing?: Yes Does the patient have difficulty seeing, even when wearing glasses/contacts?: Yes Does the patient have difficulty concentrating, remembering, or making decisions?: Yes  Permission Sought/Granted Permission sought to share information with : Oceanographer granted to share info w AGENCY: [facility]  Emotional Assessment Attitude/Demeanor/Rapport: Unable to Assess Affect (typically observed): Unable to Assess Orientation: : Oriented to Self Alcohol / Substance Use: Not Applicable Psych Involvement: No (comment)  Admission diagnosis:  Sepsis [facility]) [A41.9] Patient Active Problem List   Diagnosis Date Noted   Sepsis (HCC) 12/24/2023   SIRS (systemic inflammatory response syndrome) (HCC) 12/24/2023   UTI (urinary tract infection) 12/24/2023   Acute encephalopathy 12/09/2023   COVID-19 08/03/2022   Influenza A 08/02/2022   COVID-19 virus infection 08/02/2022   Paroxysmal atrial fibrillation (HCC) 08/02/2022   Cognitive impairment 08/02/2022   AKI (acute kidney injury) (HCC) 08/02/2022   History of stroke 07/27/2021   History of loop recorder 07/27/2021   Wound of right leg 12/29/2020   Pressure injury of skin 12/13/2020   Cellulitis of leg, right 12/11/2020   Pulmonary vascular congestion 12/11/2020   Rhabdomyolysis 12/11/2020   Lactic acid acidosis 12/11/2020   Essential hypertension 08/16/2018   Hypertensive kidney disease with chronic kidney disease stage III 08/16/2018   VTE (venous thromboembolism) 08/16/2018   Peripheral neuropathy 08/16/2018   Glaucoma 08/16/2018   Onychomycosis 08/16/2018   Leg edema 08/16/2018   Hyperlipidemia 08/16/2018  Overactive bladder 08/16/2018    Coronary artery disease involving native heart without angina pectoris 08/16/2018   Hemiparesis affecting right side as late effect of cerebrovascular accident [facility])    Abdominal aortic aneurysm (AAA) (HCC)    PCP:  [facility] Pharmacy:  No Pharmacies Listed  Social Drivers of Health (SDOH) Social History: SDOH Screenings   Food Insecurity: No Food Insecurity (12/24/2023)  Housing: Low Risk  (12/24/2023)  Transportation Needs: No Transportation Needs (12/24/2023)  Utilities: Not At Risk (12/24/2023)  Depression (PHQ2-9): Low Risk  (02/28/2020)  Financial Resource Strain: Low Risk  (02/28/2020)  Physical Activity: Inactive (02/28/2020)  Social Connections: Unknown (12/24/2023)  Stress: No Stress Concern Present (02/28/2020)  Tobacco Use: Medium Risk (12/24/2023)   SDOH Interventions:    Readmission Risk Interventions    08/04/2022   11:18 AM  Readmission Risk Prevention Plan  Post Dischage Appt Complete  Medication Screening Complete  Transportation Screening Complete

## 2023-12-26 NOTE — Plan of Care (Signed)
  Problem: Clinical Measurements: °Goal: Ability to maintain clinical measurements within normal limits will improve °Outcome: Progressing °  °Problem: Education: °Goal: Knowledge of General Education information will improve °Description: Including pain rating scale, medication(s)/side effects and non-pharmacologic comfort measures °Outcome: Not Progressing °  °Problem: Health Behavior/Discharge Planning: °Goal: Ability to manage health-related needs will improve °Outcome: Not Progressing °  °

## 2023-12-26 NOTE — Progress Notes (Signed)
 [facility]                                                                              Steven Kent, is a 89 y.o. male, DOB - 12-21-1934, GNF:[id number] Admit date - 12/24/2023    Outpatient Primary MD for the patient is [facility]  LOS - 2  days  Chief Complaint  Patient presents with   Fever       Brief summary   Patient is a 89 y.o. male with PMH significant of HTN, AAA, CVA with right-sided weakness, CAD, paroxysmal A-fib on Xarelto , dementia chronic cognitive impairment with recent hospitalization 12/09/2023-12/12/2023 for AKI, acute metabolic encephalopathy (concern for aspiration pneumonia, was discharged to SNF on Augmentin ).    Patient presented from the [place] facility with fevers, 102 F at 7:30 AM and received Tylenol .  Patient had reported that his" whole body" hurts but no other symptoms including cough, dysuria, abdominal pain, chest pain or shortness of breath.  Per the nursing staff at the facility, he was too tired to sit up and eat breakfast otherwise no acute symptoms.  Patient has dementia and unable to provide review of systems he does not know why he is in the hospital.  Patient noted to have hematuria.   Assessment & Plan    Severe sepsis secondary to UTI, acute metabolic encephalopathy - Patient met SIRS criteria with fevers, mild tachypnea, leukocytosis, acute encephalopathy (superimposed on dementia), secondary to UTI - BC ID + staph species, Staph epidermidis, methicillin resistant coagulase-negative staph, likely contaminant, continue to follow final blood cultures - Urine culture showed no growth, continue IV Rocephin  -Overnight, spiking fevers, 103.2 F, will repeat blood cultures, obtain CT abdomen pelvis to rule out any underlying pyelonephritis, renal abscess     Active Problems: Acute metabolic encephalopathy superimposed on dementia - Continue donepezil , treat UTI as above - Delirium precautions, fall precautions,  aspiration precautions - Continue gabapentin  300 mg 3 times daily  Hematuria - In the setting of UTI and Xarelto  - Xarelto  held, hematuria improving - Stable      Hemiparesis affecting right side as late effect of cerebrovascular accident (HCC) - Continue Lipitor - Hold xarelto  due to hematuria    History of glaucoma - Continue eyedrops including Alphagan , Cosopt , Xalatan      Essential hypertension - BP stable     Pressure injury of skin -Right inguinal fold, scrotum -Wound care consulted     Paroxysmal atrial fibrillation (HCC) - Rate controlled, - hold xarelto    Right deep tissue heel pressure injury - POA - Wound care per nursing  Estimated body mass index is 27.11 kg/m as calculated from the following:   Height as of this encounter: 6\' 1"  (1.854 m).   Weight as of this encounter: 93.2 kg.  Code Status: Full CODE STATUS DVT Prophylaxis:  Place and maintain sequential compression device Start: 12/24/23 1716   Level of Care: Level of care: Progressive Family Communication: Updated patient Disposition Plan:      Remains inpatient appropriate:      Procedures:  None  Consultants:   None  Antimicrobials:   Anti-infectives (From admission, onward)  Start     Dose/Rate Route Frequency Ordered Stop   12/25/23 1200  cefTRIAXone  (ROCEPHIN ) 1 g in sodium chloride  0.9 % 100 mL IVPB        1 g 200 mL/hr over 30 Minutes Intravenous Every 24 hours 12/24/23 1658     12/24/23 1145  cefTRIAXone  (ROCEPHIN ) 1 g in sodium chloride  0.9 % 100 mL IVPB        1 g 200 mL/hr over 30 Minutes Intravenous  Once 12/24/23 1135 12/24/23 1249          Medications  atorvastatin   40 mg Oral QHS   baclofen   10 mg Oral BID   brimonidine   1 drop Both Eyes BID   donepezil   10 mg Oral QHS   dorzolamide -timolol   1 drop Both Eyes BID   gabapentin   300 mg Oral TID   latanoprost   1 drop Both Eyes QHS   melatonin  5 mg Oral QHS   senna  1 tablet Oral BID   [START ON 12/31/2023]  Vitamin D  (Ergocalciferol )  50,000 Units Oral Q Sun      Subjective:   Steven Kent was seen and examined today.  Overnight, had a fever of 103.2 F, afebrile this morning.  Hematuria improving   No acute nausea vomiting, abdominal pain, chest pain or shortness of breath Objective:   Vitals:   12/25/23 2204 12/26/23 0158 12/26/23 0624 12/26/23 1001  BP:  (!) 142/90 (!) 141/81 (!) 144/73  Pulse:  80 61 69  Resp:  20 20 (!) 24  Temp: (!) 102 F (38.9 C) 98.4 F (36.9 C) 99.3 F (37.4 C) 99.5 F (37.5 C)  TempSrc: Oral Oral Oral Oral  SpO2:  97% 98% 98%  Weight:      Height:        Intake/Output Summary (Last 24 hours) at 12/26/2023 1143 Last data filed at 12/26/2023 1610 Gross per 24 hour  Intake 932.65 ml  Output 650 ml  Net 282.65 ml     Wt Readings from Last 3 Encounters:  12/24/23 93.2 kg  12/10/23 93.2 kg  04/05/23 81.6 kg   Physical Exam General: Alert and oriented x self, place, NAD  Cardiovascular: S1 S2 clear, RRR.  Respiratory: CTAB Gastrointestinal: Soft, nontender, nondistended, NBS Ext: no pedal edema bilaterally Neuro: chronic sided deficits from prior CVA Psych : dementia      Data Reviewed:  I have personally reviewed following labs    CBC Lab Results  Component Value Date   WBC 10.1 12/25/2023   RBC 4.18 (L) 12/25/2023   HGB 13.3 12/25/2023   HCT 42.5 12/25/2023   MCV 101.7 (H) 12/25/2023   MCH 31.8 12/25/2023   PLT 154 12/25/2023   MCHC 31.3 12/25/2023   RDW 13.9 12/25/2023   LYMPHSABS 1.6 12/24/2023   MONOABS 0.8 12/24/2023   EOSABS 0.1 12/24/2023   BASOSABS 0.0 12/24/2023     Last metabolic panel Lab Results  Component Value Date   NA 137 12/25/2023   K 3.5 12/25/2023   CL 105 12/25/2023   CO2 24 12/25/2023   BUN 10 12/25/2023   CREATININE 0.81 12/25/2023   GLUCOSE 98 12/25/2023   GFRNONAA >60 12/25/2023   GFRAA 48 (L) 03/18/2020   CALCIUM  8.4 (L) 12/25/2023   PHOS 2.2 (L) 08/04/2022   PROT 6.9 12/25/2023    ALBUMIN 3.1 (L) 12/25/2023   BILITOT 1.5 (H) 12/25/2023   ALKPHOS 59 12/25/2023   AST 18 12/25/2023   ALT 9 12/25/2023  ANIONGAP 8 12/25/2023    CBG (last 3)  No results for input(s): "GLUCAP" in the last 72 hours.    Coagulation Profile: Recent Labs  Lab 12/24/23 1030  INR 3.0*     Radiology Studies: I have personally reviewed the imaging studies  No results found.      Bertram Brocks M.D. [facility] 12/26/2023, 11:43 AM  Available via Epic secure chat 7am-7pm After 7 pm, please refer to night coverage provider listed on amion.

## 2023-12-27 DIAGNOSIS — R6510 Systemic inflammatory response syndrome (SIRS) of non-infectious origin without acute organ dysfunction: Secondary | ICD-10-CM | POA: Diagnosis not present

## 2023-12-27 DIAGNOSIS — N390 Urinary tract infection, site not specified: Secondary | ICD-10-CM

## 2023-12-27 DIAGNOSIS — F05 Delirium due to known physiological condition: Secondary | ICD-10-CM

## 2023-12-27 DIAGNOSIS — L89616 Pressure-induced deep tissue damage of right heel: Secondary | ICD-10-CM

## 2023-12-27 DIAGNOSIS — R319 Hematuria, unspecified: Secondary | ICD-10-CM

## 2023-12-27 DIAGNOSIS — H409 Unspecified glaucoma: Secondary | ICD-10-CM

## 2023-12-27 DIAGNOSIS — R6520 Severe sepsis without septic shock: Secondary | ICD-10-CM

## 2023-12-27 DIAGNOSIS — N5089 Other specified disorders of the male genital organs: Secondary | ICD-10-CM

## 2023-12-27 DIAGNOSIS — F0280 Dementia in other diseases classified elsewhere without behavioral disturbance: Secondary | ICD-10-CM

## 2023-12-27 DIAGNOSIS — G9341 Metabolic encephalopathy: Secondary | ICD-10-CM

## 2023-12-27 LAB — URINALYSIS, ROUTINE W REFLEX MICROSCOPIC
Bacteria, UA: NONE SEEN
Glucose, UA: NEGATIVE mg/dL
Ketones, ur: 5 mg/dL — AB
Nitrite: NEGATIVE
Protein, ur: >=300 mg/dL — AB
Specific Gravity, Urine: 1.031 — ABNORMAL HIGH (ref 1.005–1.030)
pH: 5 (ref 5.0–8.0)

## 2023-12-27 LAB — CBC
HCT: 43.8 % (ref 39.0–52.0)
Hemoglobin: 13.9 g/dL (ref 13.0–17.0)
MCH: 31.6 pg (ref 26.0–34.0)
MCHC: 31.7 g/dL (ref 30.0–36.0)
MCV: 99.5 fL (ref 80.0–100.0)
Platelets: 141 10*3/uL — ABNORMAL LOW (ref 150–400)
RBC: 4.4 MIL/uL (ref 4.22–5.81)
RDW: 13.7 % (ref 11.5–15.5)
WBC: 5.6 10*3/uL (ref 4.0–10.5)
nRBC: 0 % (ref 0.0–0.2)

## 2023-12-27 LAB — BASIC METABOLIC PANEL WITH GFR
Anion gap: 9 (ref 5–15)
BUN: 10 mg/dL (ref 8–23)
CO2: 24 mmol/L (ref 22–32)
Calcium: 8.3 mg/dL — ABNORMAL LOW (ref 8.9–10.3)
Chloride: 103 mmol/L (ref 98–111)
Creatinine, Ser: 0.8 mg/dL (ref 0.61–1.24)
GFR, Estimated: >60 mL/min (ref >60)
Glucose, Bld: 100 mg/dL — ABNORMAL HIGH (ref 70–99)
Potassium: 3.3 mmol/L — ABNORMAL LOW (ref 3.5–5.1)
Sodium: 136 mmol/L (ref 135–145)

## 2023-12-27 MED ORDER — POLYETHYLENE GLYCOL 3350 17 G PO PACK
17.0000 g | PACK | Freq: Two times a day (BID) | ORAL | Status: DC
  Filled 2023-12-27: qty 1

## 2023-12-27 NOTE — Plan of Care (Signed)
  Problem: Pain Managment: Goal: General experience of comfort will improve and/or be controlled Outcome: Not Progressing   Problem: Elimination: Goal: Will not experience complications related to bowel motility Outcome: Progressing Goal: Will not experience complications related to urinary retention Outcome: Progressing   Problem: Clinical Measurements: Goal: Ability to maintain clinical measurements within normal limits will improve Outcome: Progressing Goal: Will remain free from infection Outcome: Progressing Goal: Diagnostic test results will improve Outcome: Progressing Goal: Respiratory complications will improve Outcome: Progressing Goal: Cardiovascular complication will be avoided Outcome: Progressing

## 2023-12-27 NOTE — Progress Notes (Addendum)
 PROGRESS NOTE    Steven Kent  ZOX:[id number] DOB: 1935/02/23 DOA: 12/24/2023 PCP: Stasia Edelman  Chief Complaint  Patient presents with   Fever    Brief Narrative:   Patient is Steven Kent 89 y.o. male with PMH significant of HTN, AAA, CVA with right-sided weakness, CAD, paroxysmal Tyger Wichman-fib on Xarelto , dementia chronic cognitive impairment with recent hospitalization 12/09/2023-12/12/2023 for AKI, acute metabolic encephalopathy (concern for aspiration pneumonia, was discharged to SNF on Augmentin ).    Patient presented from the [place] facility with fevers, 102 F at 7:30 AM and received Tylenol .  Patient had reported that his" whole body" hurts but no other symptoms including cough, dysuria, abdominal pain, chest pain or shortness of breath.  Per the nursing staff at the facility, he was too tired to sit up and eat breakfast otherwise no acute symptoms.  Patient has dementia and unable to provide review of systems he does not know why he is in the hospital.  Patient noted to have hematuria.  Assessment & Plan:   Principal Problem:   SIRS (systemic inflammatory response syndrome) (HCC) Active Problems:   Hemiparesis affecting right side as late effect of cerebrovascular accident [facility])   Essential hypertension   Pressure injury of skin   Paroxysmal atrial fibrillation (HCC)   Acute encephalopathy   UTI (urinary tract infection)  Severe sepsis secondary to UTI, acute metabolic encephalopathy - Patient met SIRS criteria with fevers, mild tachypnea, leukocytosis, acute encephalopathy (superimposed on dementia), secondary to UTI - BC ID + staph species, Staph epidermidis/staph hominis likely contaminants, continue to follow final blood cultures - Urine culture showed no growth, continue IV Rocephin  - CT with circumferential bladder thickening with perivesical stranding c/w cystitis, symmetric perinephric stranding (chronic or due to pyelo), constipation with mild rectal thickening and adjacent  stranding (dependent pelvic congestive change vs proctitis) (see report) - last fever was 5/26 PM it appears - repeat 5/27 blood cultures pending - urine culture 5/25 NG - will continue ceftriaxone  to cover urinary sources - his wounds don't appear infected, low threshold for additional imaging for this (or CT chest with last hospitalization with aspiration pna)  Acute metabolic encephalopathy superimposed on dementia - Continue donepezil , treat UTI as above - Delirium precautions, fall precautions, aspiration precautions   Hematuria - In the setting of UTI and Xarelto  - Xarelto  held, hematuria improving - Stable     Hemiparesis affecting right side as late effect of cerebrovascular accident (HCC) - Continue Lipitor - Hold xarelto  due to hematuria  - baclofen , gabapentin  (has chronic LE pain)   Calcifications and thickening of the aortic valve leaflets Will update echo  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 Needs repeat CTA or MRA in 6 months and follow up with vascular  Mild Body Wall Edema Monitor Follow echo    Essential hypertension - continue to monitor, not on meds  History of glaucoma - Continue eyedrops including Alphagan , Cosopt , Xalatan     Paroxysmal atrial fibrillation (HCC) - Rate controlled, - hold xarelto  due to hematuria, monitor for resumption   Right deep tissue heel pressure injury Scrotal Ulcerations - POA - Wound care per nursing    DVT prophylaxis: SCD Code Status: full Family Communication: none Disposition:   Status is: Inpatient Remains inpatient appropriate because: need for ongoing care   Consultants:  none  Procedures:  none  Antimicrobials:  Anti-infectives (From admission, onward)    Start     Dose/Rate Route Frequency Ordered Stop   12/25/23  1200  cefTRIAXone  (ROCEPHIN ) 1 g in sodium chloride  0.9 % 100 mL IVPB        1 g 200 mL/hr over 30 Minutes Intravenous Every 24 hours  12/24/23 1658     12/24/23 1145  cefTRIAXone  (ROCEPHIN ) 1 g in sodium chloride  0.9 % 100 mL IVPB        1 g 200 mL/hr over 30 Minutes Intravenous  Once 12/24/23 1135 12/24/23 1249       Subjective: No new complaints Chronic LE pain  Objective: Vitals:   12/26/23 2010 12/27/23 0606 12/27/23 1013 12/27/23 1253  BP: (!) 157/87 (!) 167/92  (!) 126/90  Pulse: 69 74  73  Resp: 20 20  20   Temp: 97.6 F (36.4 C) 99 F (37.2 C) 99.3 F (37.4 C) 99.1 F (37.3 C)  TempSrc: Oral Oral Oral Oral  SpO2: 100% 99%  98%  Weight:      Height:        Intake/Output Summary (Last 24 hours) at 12/27/2023 1940 Last data filed at 12/27/2023 1133 Gross per 24 hour  Intake 403.87 ml  Output 150 ml  Net 253.87 ml   Filed Weights   12/24/23 1129  Weight: 93.2 kg    Examination:  General exam: Appears calm and comfortable  Respiratory system: unlabored Cardiovascular system: RRR Gastrointestinal system: Abdomen is nondistended, soft and nontender.  Central nervous system: alert, R>L sided weakness No LEE - significantly TTP LE's   Data Reviewed: I have personally reviewed following labs and imaging studies  CBC: Recent Labs  Lab 12/24/23 1030 12/25/23 0843 12/26/23 1226 12/27/23 0408  WBC 11.7* 10.1 7.4 5.6  NEUTROABS 9.1*  --   --   --   HGB 14.8 13.3 14.9 13.9  HCT 45.3 42.5 48.0 43.8  MCV 98.7 101.7* 103.0* 99.5  PLT 171 154 105* 141*    Basic Metabolic Panel: Recent Labs  Lab 12/24/23 1030 12/25/23 0843 12/26/23 1226 12/27/23 0408  NA 140 137 138 136  K 3.8 3.5 3.2* 3.3*  CL 106 105 103 103  CO2 21* 24 24 24   GLUCOSE 113* 98 106* 100*  BUN 15 10 10 10   CREATININE 1.04 0.81 1.12 0.80  CALCIUM  9.1 8.4* 8.0* 8.3*    GFR: Estimated Creatinine Clearance: 74.9 mL/min (by C-G formula based on SCr of 0.8 mg/dL).  Liver Function Tests: Recent Labs  Lab 12/24/23 1030 12/25/23 0843  AST 21 18  ALT 13 9  ALKPHOS 67 59  BILITOT 2.0* 1.5*  PROT 7.8 6.9  ALBUMIN  3.6 3.1*    CBG: No results for input(s): "GLUCAP" in the last 168 hours.   Recent Results (from the past 240 hours)  Urine Culture     Status: None   Collection Time: 12/24/23 11:01 AM   Specimen: Urine, Random  Result Value Ref Range Status   Specimen Description   Final    URINE, RANDOM Performed at [facility], [address] [place], [place] [address]    Special Requests   Final    NONE Reflexed from [id number] Performed at [facility], [address], [place], [place] [address]    Culture   Final    NO GROWTH Performed at [facility], [address] [place], [place] [address]    Report Status 12/25/2023 FINAL  Final  Culture, blood (Routine x 2)     Status: Abnormal   Collection Time: 12/24/23 11:22 AM   Specimen: BLOOD  Result Value Ref Range Status   Specimen Description   Final    BLOOD SITE NOT SPECIFIED Performed at [facility], [address], [place], [place] [address]    Special Requests   Final    BOTTLES DRAWN AEROBIC AND ANAEROBIC Blood Culture adequate volume Performed at [facility], [address], [place], [place] [address]    Culture  Setup Time   Final    GRAM POSITIVE COCCI ANAEROBIC BOTTLE ONLY CRITICAL RESULT CALLED TO, READ BACK BY AND VERIFIED WITH: J LEGGE,PHARMD@0714  12/25/23 MK    Culture (Niclas Markell)  Final    STAPHYLOCOCCUS HOMINIS STAPHYLOCOCCUS EPIDERMIDIS THE SIGNIFICANCE OF ISOLATING THIS ORGANISM FROM Delfin Squillace SINGLE SET OF BLOOD CULTURES WHEN MULTIPLE SETS ARE DRAWN IS UNCERTAIN. PLEASE NOTIFY THE MICROBIOLOGY DEPARTMENT WITHIN ONE WEEK IF SPECIATION AND SENSITIVITIES ARE REQUIRED. Performed at [facility], [address], [place], [place] [address]    Report Status 12/26/2023 FINAL  Final  Blood Culture ID Panel (Reflexed)     Status: Abnormal   Collection Time: 12/24/23 11:22 AM  Result Value Ref Range Status   Enterococcus faecalis NOT DETECTED NOT  DETECTED Final   Enterococcus Faecium NOT DETECTED NOT DETECTED Final   Listeria monocytogenes NOT DETECTED NOT DETECTED Final   Staphylococcus species DETECTED (Meelah Tallo) NOT DETECTED Final    Comment: CRITICAL RESULT CALLED TO, READ BACK BY AND VERIFIED WITH:  J LEGGE,PHARMD@0715  12/25/23 MK    Staphylococcus aureus (BCID) NOT DETECTED NOT DETECTED Final   Staphylococcus epidermidis DETECTED (Vyncent Overby) NOT DETECTED Final    Comment: Methicillin (oxacillin) resistant coagulase negative staphylococcus. Possible blood culture contaminant (unless isolated from more than one blood culture draw or clinical case suggests pathogenicity). No antibiotic treatment is indicated for blood  culture contaminants. CRITICAL RESULT CALLED TO, READ BACK BY AND VERIFIED WITH: J LEGGE,PHARMD@0715  12/25/23 MK    Staphylococcus lugdunensis NOT DETECTED NOT DETECTED Final   Streptococcus species NOT DETECTED NOT DETECTED Final   Streptococcus agalactiae NOT DETECTED NOT DETECTED Final   Streptococcus pneumoniae NOT DETECTED NOT DETECTED Final   Streptococcus pyogenes NOT DETECTED NOT DETECTED Final   Haide Klinker.calcoaceticus-baumannii NOT DETECTED NOT DETECTED Final   Bacteroides fragilis NOT DETECTED NOT DETECTED Final   Enterobacterales NOT DETECTED NOT DETECTED Final   Enterobacter cloacae complex NOT DETECTED NOT DETECTED Final   Escherichia coli NOT DETECTED NOT DETECTED Final   Klebsiella aerogenes NOT DETECTED NOT DETECTED Final   Klebsiella oxytoca NOT DETECTED NOT DETECTED Final   Klebsiella pneumoniae NOT DETECTED NOT DETECTED Final   Proteus species NOT DETECTED NOT DETECTED Final   Salmonella species NOT DETECTED NOT DETECTED Final   Serratia marcescens NOT DETECTED NOT DETECTED Final   Haemophilus influenzae NOT DETECTED NOT DETECTED Final   Neisseria meningitidis NOT DETECTED NOT DETECTED Final   Pseudomonas aeruginosa NOT DETECTED NOT DETECTED Final   Stenotrophomonas maltophilia NOT DETECTED NOT DETECTED Final    Candida albicans NOT DETECTED NOT DETECTED Final   Candida auris NOT DETECTED NOT DETECTED Final   Candida glabrata NOT DETECTED NOT DETECTED Final   Candida krusei NOT DETECTED NOT DETECTED Final   Candida parapsilosis NOT DETECTED NOT DETECTED Final   Candida tropicalis NOT DETECTED NOT DETECTED Final   Cryptococcus neoformans/gattii NOT DETECTED NOT DETECTED Final   Methicillin resistance mecA/C DETECTED (Juaquina Machnik) NOT DETECTED Final    Comment: CRITICAL RESULT CALLED TO, READ BACK BY AND VERIFIED WITH: J LEGGE,PHARMD@0715  12/25/23 MK Performed at [facility], [address] [place], [place] [address]   Resp panel by RT-PCR (RSV, Flu Saesha Llerenas&B, Covid) Anterior Nasal Swab     Status: None   Collection Time: 12/24/23 11:58 AM   Specimen: Anterior Nasal Swab  Result Value Ref Range Status   SARS Coronavirus 2 by RT PCR NEGATIVE NEGATIVE Final    Comment: (NOTE) SARS-CoV-2 target nucleic acids are NOT DETECTED.  The SARS-CoV-2 RNA is generally detectable in upper respiratory specimens during the acute phase of infection. The lowest concentration of SARS-CoV-2 viral copies this assay can detect is 138 copies/mL. Aleesa Sweigert negative result does not preclude SARS-Cov-2 infection and should not be used as the sole basis for treatment or other patient management decisions. Teddie Curd negative result may occur with  improper specimen collection/handling, submission of specimen other than nasopharyngeal swab, presence of viral mutation(s) within the areas targeted by this assay, and inadequate number of viral copies(<138 copies/mL). Aoi Kouns negative result must be combined with clinical observations, patient history, and epidemiological information. The expected result is Negative.  Fact Sheet for Patients:  BloggerCourse.com  Fact Sheet for Healthcare Providers:  SeriousBroker.it  This test is no t yet approved or cleared by the [place]  FDA and  has been  authorized for detection and/or diagnosis of SARS-CoV-2 by FDA under an Emergency Use Authorization (EUA). This EUA will remain  in effect (meaning this test can be used) for the duration of the COVID-19 declaration under Section 564(b)(1) of the Act, 21 U.S.C.section 360bbb-3(b)(1), unless the authorization is terminated  or revoked sooner.       Influenza Candy Leverett by PCR NEGATIVE NEGATIVE Final   Influenza B by PCR NEGATIVE NEGATIVE Final    Comment: (NOTE) The Xpert Xpress SARS-CoV-2/FLU/RSV plus assay is intended as an aid in the diagnosis of influenza from Nasopharyngeal swab specimens and should not be used as Braylen Denunzio sole basis for treatment. Nasal washings and aspirates are unacceptable for Xpert Xpress SARS-CoV-2/FLU/RSV testing.  Fact Sheet for Patients: BloggerCourse.com  Fact Sheet for Healthcare Providers: SeriousBroker.it  This test is not yet approved or cleared by the [place]  FDA and has been authorized for detection and/or diagnosis of SARS-CoV-2 by FDA under an Emergency Use Authorization (EUA). This EUA will remain in effect (meaning this test can be used) for the duration of the COVID-19 declaration under Section 564(b)(1) of the Act, 21 U.S.C. section 360bbb-3(b)(1), unless the authorization is terminated or revoked.     Resp Syncytial Virus by PCR NEGATIVE NEGATIVE Final    Comment: (NOTE) Fact Sheet for Patients: BloggerCourse.com  Fact Sheet for Healthcare Providers: SeriousBroker.it  This test is not yet approved or cleared by the [place]  FDA and has been authorized for detection and/or diagnosis of SARS-CoV-2 by FDA under an Emergency Use Authorization (EUA). This EUA will remain in effect (meaning this test can be used) for the duration of the COVID-19 declaration under Section 564(b)(1) of the Act, 21 U.S.C. section 360bbb-3(b)(1), unless the  authorization is terminated or revoked.  Performed at [facility], [address], [place], [place] [address]   Culture, blood (Routine X 2) w Reflex to ID Panel     Status: None (Preliminary result)   Collection Time: 12/25/23  8:43 AM   Specimen: BLOOD  Result Value Ref Range Status   Specimen Description   Final    BLOOD BLOOD LEFT ARM AEROBIC BOTTLE ONLY ANAEROBIC BOTTLE ONLY Performed at [facility], [address], [place], [place] [address]  Special Requests   Final    BOTTLES DRAWN AEROBIC AND ANAEROBIC Blood Culture results may not be optimal due to an inadequate volume of blood received in culture bottles Performed at [facility], [address], [place], [place] [address]    Culture   Final    NO GROWTH 2 DAYS Performed at [facility], [address] [place], [place] [address]    Report Status PENDING  Incomplete  Culture, blood (Routine X 2) w Reflex to ID Panel     Status: None (Preliminary result)   Collection Time: 12/26/23 12:26 PM   Specimen: BLOOD  Result Value Ref Range Status   Specimen Description   Final    BLOOD BLOOD LEFT ARM AEROBIC BOTTLE ONLY Performed at [facility], [address], [place], [place] [address]    Special Requests   Final    BOTTLES DRAWN AEROBIC ONLY Blood Culture results may not be optimal due to an inadequate volume of blood received in culture bottles Performed at [facility], [address] [place], [place] [address]    Culture   Final    NO GROWTH < 24 HOURS Performed at [facility], [address] [place], [place] [address]    Report Status PENDING  Incomplete  Culture, blood (Routine X 2) w Reflex to ID Panel     Status: None (Preliminary result)   Collection Time: 12/26/23 12:26 PM   Specimen: BLOOD  Result Value Ref Range Status   Specimen Description   Final    BLOOD BLOOD LEFT ARM AEROBIC BOTTLE  ONLY Performed at [facility], [address], [place], [place] [address]    Special Requests   Final    BOTTLES DRAWN AEROBIC ONLY Blood Culture results may not be optimal due to an inadequate volume of blood received in culture bottles Performed at [facility], [address] [place], [place] [address]    Culture   Final    NO GROWTH < 24 HOURS Performed at [facility], [address] [place], [place] [address]    Report Status PENDING  Incomplete         Radiology Studies: CT ABDOMEN PELVIS WO CONTRAST Result Date: 12/26/2023 CLINICAL DATA:  Sepsis, fevers, UTI and hematuria. Check for renal abscess or noncontrast findings of pyelonephritis. EXAM: CT ABDOMEN AND PELVIS WITHOUT CONTRAST TECHNIQUE: Multidetector CT imaging of the abdomen and pelvis was performed following the standard protocol without IV contrast. RADIATION DOSE REDUCTION: This exam was performed according to the departmental dose-optimization program which includes automated exposure control, adjustment of the mA and/or kV according to patient size and/or use of iterative reconstruction technique. COMPARISON:  None Available. FINDINGS: Lower chest: Asymmetric elevation right hemidiaphragm noted in Kaydee Magel minimal layering right pleural effusion. There is mild posterior atelectasis both lung bases. There is Ashiah Karpowicz densely calcified granuloma in the lingular base. Above this level there is Brealyn Baril 5 mm noncalcified lingular nodule laterally on 4:20. There is mild subpleural reticulation over portions of both lung bases with single layer up to 3 layer honeycombing in the anterior and medial lingula. Lung bases are otherwise clear. There is mild cardiomegaly, moderate to severe calcifications and thickening of the aortic valve leaflets, scattered calcific plaques in the LAD and circumflex coronary arteries. There is no pericardial effusion. Loop recorder device noted medial left chest wall and  bilateral discoid gynecomastia. Hepatobiliary: No focal liver abnormality is  seen without contrast. No gallstones, gallbladder wall thickening, or biliary dilatation. Pancreas: The unremarkable without contrast. Spleen: Unremarkable without contrast. Adrenals/Urinary Tract: There is no adrenal mass. There is moderate streak artifact over the kidneys from the patient's arms in the field. There are Bosniak 1 cysts in the posterior right kidney, on 3:39 measuring 1.7 cm and -17 Hounsfield units laterally, and 1.3 cm and -11 Hounsfield units medially. On the left, there is Cedricka Sackrider Bosniak 2 cyst laterally at the midpole measuring 1.5 cm and 22 Hounsfield units, another medially measuring 1.7 cm and 8 Hounsfield units both on 3:40. There is Daundre Biel left upper pole Bosniak 1 cyst measuring 1.8 cm and 15 Hounsfield units. No follow-up imaging is recommended for the findings. There is symmetric perinephric stranding which could be senescent and chronic or due to pyelonephritis. There is no urinary stone or obstruction. There is circumferential bladder thickening with perivesical stranding consistent with cystitis. The wall thickening could in part be due to mass effect from the prostate gland. Stomach/Bowel: Unremarkable gastric wall. Normal caliber small bowel. No inflammatory changes. Normal appendix. Moderate fecal stasis. This includes in the rectum. There is mild rectal thickening and adjacent stranding, unclear if this is due to dependent pelvic congestive change or proctitis. Rest of the colon wall is normal in thickness. Vascular/Lymphatic: Moderate to heavy aortoiliac atherosclerosis. There is Shannyn Jankowiak fusiform tripartite infrarenal AAA. The upper component measures 3.0 cm on 6:48. The mid component is 3.5 cm also 6:48. The bottom component, just above the iliac bifurcation is 5.3 x 4.7 cm on 6:43 and 7:84. 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 Tortuosity and ectasia of the  remaining iliac arteries. No abdominal or pelvic adenopathy is seen. Reproductive: Enlarged prostate, measures 5.5 cm with impression into the bladder base. Normal seminal vesicles. Other: Trace presacral ascites. Small umbilical fat hernia. No free hemorrhage, free air, abscess or incarcerated hernia. Mild body wall edema in both flanks and thighs. Musculoskeletal: Osteopenia and degenerative change lower thoracic and lumbar spine, mild hip DJD. Bridging osteophytes right SI joint. No acute or other significant osseous findings. IMPRESSION: 1. Circumferential bladder thickening with perivesical stranding consistent with cystitis. The wall thickening could in part be due to mass effect from the prostate gland. 2. Symmetric perinephric stranding which could be senescent and chronic or due to pyelonephritis. No urinary stone or obstruction. 3. Constipation with mild rectal thickening and adjacent stranding, unclear if this is due to dependent pelvic congestive change or proctitis. 4. Cardiomegaly with calcifications and thickening of the aortic valve leaflets. Echocardiography recommended. 5. Aortic and coronary artery atherosclerosis. 6. 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 Recommend CTA or MRA, as appropriate, in 6 months and referral to [facility] Mongiello vascular specialist. Reference: Journal of Vascular Surgery 67.1 (2018): 2-77. J Am Coll Radiol 2013;10:789-794. 7. Minimal layering right pleural effusion. 8. 5 mm noncalcified lingular nodule. No follow-up needed if patient is low-risk.This recommendation follows the consensus statement: Guidelines for Management of Incidental Pulmonary Nodules Detected on CT Images: From the Fleischner Society 2017; Radiology 2017; 284:228-243. 9. Mild body wall edema in the flanks and thighs. 10. Umbilical fat hernia. 11. Osteopenia and degenerative change. Aortic Atherosclerosis  (ICD10-I70.0). Electronically Signed   By: Denman Fischer M.D.   On: 12/26/2023 21:06        Scheduled Meds:  atorvastatin   40 mg Oral QHS   baclofen   10 mg Oral BID   brimonidine   1 drop Both Eyes BID   dorzolamide -timolol   1 drop Both Eyes BID   gabapentin   300 mg Oral TID   latanoprost   1 drop Both Eyes QHS   melatonin  5 mg Oral QHS   polyethylene glycol  17 g Oral BID   senna  1 tablet Oral BID   [START ON 12/31/2023] Vitamin D  (Ergocalciferol )  50,000 Units Oral Q Sun   Continuous Infusions:  cefTRIAXone  (ROCEPHIN )  IV 1 g (12/27/23 1230)     LOS: 3 days    Time spent: over 30 min    Donnetta Gains, MD [facility]   To contact the attending provider between 7A-7P or the covering provider during after hours 7P-7A, please log into the web site [email or web address] and access using universal [place] password for that web site. If you do not have the password, please call the hospital operator.  12/27/2023, 7:40 PM

## 2023-12-28 DIAGNOSIS — R6510 Systemic inflammatory response syndrome (SIRS) of non-infectious origin without acute organ dysfunction: Secondary | ICD-10-CM | POA: Diagnosis not present

## 2023-12-28 LAB — CBC
HCT: 41.4 % (ref 39.0–52.0)
Hemoglobin: 13.2 g/dL (ref 13.0–17.0)
MCH: 32 pg (ref 26.0–34.0)
MCHC: 31.9 g/dL (ref 30.0–36.0)
MCV: 100.5 fL — ABNORMAL HIGH (ref 80.0–100.0)
Platelets: 137 10*3/uL — ABNORMAL LOW (ref 150–400)
RBC: 4.12 MIL/uL — ABNORMAL LOW (ref 4.22–5.81)
RDW: 13.7 % (ref 11.5–15.5)
WBC: 5.4 10*3/uL (ref 4.0–10.5)
nRBC: 0 % (ref 0.0–0.2)

## 2023-12-28 LAB — BASIC METABOLIC PANEL WITH GFR
Anion gap: 7 (ref 5–15)
BUN: 10 mg/dL (ref 8–23)
CO2: 26 mmol/L (ref 22–32)
Calcium: 8.1 mg/dL — ABNORMAL LOW (ref 8.9–10.3)
Chloride: 100 mmol/L (ref 98–111)
Creatinine, Ser: 1.04 mg/dL (ref 0.61–1.24)
GFR, Estimated: >60 mL/min
Glucose, Bld: 96 mg/dL (ref 70–99)
Potassium: 3.3 mmol/L — ABNORMAL LOW (ref 3.5–5.1)
Sodium: 133 mmol/L — ABNORMAL LOW (ref 135–145)

## 2023-12-28 MED ORDER — GABAPENTIN 300 MG PO CAPS
600.0000 mg | ORAL_CAPSULE | Freq: Three times a day (TID) | ORAL | Status: DC
  Administered 2023-12-28 – 2023-12-29 (×4): 600 mg via ORAL
  Filled 2023-12-28 (×4): qty 2

## 2023-12-28 MED ORDER — POTASSIUM CHLORIDE CRYS ER 20 MEQ PO TBCR
40.0000 meq | EXTENDED_RELEASE_TABLET | ORAL | Status: AC
  Administered 2023-12-28 (×2): 40 meq via ORAL
  Filled 2023-12-28 (×2): qty 2

## 2023-12-28 NOTE — Plan of Care (Signed)
   Problem: Education: Goal: Knowledge of General Education information will improve Description Including pain rating scale, medication(s)/side effects and non-pharmacologic comfort measures Outcome: Progressing

## 2023-12-28 NOTE — Progress Notes (Signed)
 PROGRESS NOTE    Steven Kent  OZD:[id number] DOB: 1934/10/23 DOA: 12/24/2023 PCP: Stasia Edelman  Chief Complaint  Patient presents with   Fever    Brief Narrative:   Patient is Steven Kent 89 y.o. male with PMH significant of HTN, AAA, CVA with right-sided weakness, CAD, paroxysmal Shaqueena Mauceri-fib on Xarelto , dementia chronic cognitive impairment with recent hospitalization 12/09/2023-12/12/2023 for AKI, acute metabolic encephalopathy (concern for aspiration pneumonia, was discharged to SNF on Augmentin ).    Patient presented from the [facility] facility with fevers, 102 F at 7:30 AM and received Tylenol .  Patient had reported that his" whole body" hurts but no other symptoms including cough, dysuria, abdominal pain, chest pain or shortness of breath.  Per the nursing staff at the facility, he was too tired to sit up and eat breakfast otherwise no acute symptoms.  Patient has dementia and unable to provide review of systems he does not know why he is in the hospital.  Patient noted to have hematuria.  Assessment & Plan:   Principal Problem:   SIRS (systemic inflammatory response syndrome) (HCC) Active Problems:   Hemiparesis affecting right side as late effect of cerebrovascular accident [facility])   Essential hypertension   Pressure injury of skin   Paroxysmal atrial fibrillation (HCC)   Acute encephalopathy   UTI (urinary tract infection)  Severe sepsis secondary to UTI, acute metabolic encephalopathy - Patient met SIRS criteria with fevers, mild tachypnea, leukocytosis, acute encephalopathy (superimposed on dementia), secondary to UTI - BC ID + staph species, Staph epidermidis/staph hominis likely contaminants, continue to follow final blood cultures - Urine culture showed no growth, continue IV Rocephin  - CT with circumferential bladder thickening with perivesical stranding c/w cystitis, symmetric perinephric stranding (chronic or due to pyelo), constipation with mild rectal thickening and adjacent  stranding (dependent pelvic congestive change vs proctitis) (see report) - last fever was 5/26 PM it appears - repeat 5/27 blood cultures NGx2 - urine culture 5/25 NG - will continue ceftriaxone  to cover urinary sources, plan for 7 day course of abx - his wounds don't appear infected, low threshold for additional imaging for this (or CT chest with last hospitalization with aspiration pna)  Acute metabolic encephalopathy superimposed on dementia - Continue donepezil , treat UTI as above - Delirium precautions, fall precautions, aspiration precautions   Hematuria - In the setting of UTI and Xarelto  - Xarelto  held, hematuria improving - Stable     Hemiparesis affecting right side as late effect of cerebrovascular accident (HCC) - Continue Lipitor - Hold xarelto  due to hematuria  - baclofen , gabapentin  (has chronic LE pain, exquisitely ttp - suspect this is neuropathic pain - will increase gabapentin  dose - monitor renal function for appropriateness of dosing)   Calcifications and thickening of the aortic valve leaflets Will update echo  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 Needs repeat CTA or MRA in 6 months and follow up with vascular  Mild Body Wall Edema Monitor Follow echo    Essential hypertension - continue to monitor, not on meds  History of glaucoma - Continue eyedrops including Alphagan , Cosopt , Xalatan     Paroxysmal atrial fibrillation (HCC) - Rate controlled, - hold xarelto  due to hematuria, monitor for resumption   Right deep tissue heel pressure injury Scrotal Ulcerations - POA - Wound care per nursing    DVT prophylaxis: SCD Code Status: full Family Communication: none Disposition:   Status is: Inpatient Remains inpatient appropriate because: need for ongoing care   Consultants:  none  Procedures:  none  Antimicrobials:  Anti-infectives (From admission, onward)    Start     Dose/Rate Route  Frequency Ordered Stop   12/25/23 1200  cefTRIAXone  (ROCEPHIN ) 1 g in sodium chloride  0.9 % 100 mL IVPB        1 g 200 mL/hr over 30 Minutes Intravenous Every 24 hours 12/24/23 1658 12/30/23 2359   12/24/23 1145  cefTRIAXone  (ROCEPHIN ) 1 g in sodium chloride  0.9 % 100 mL IVPB        1 g 200 mL/hr over 30 Minutes Intravenous  Once 12/24/23 1135 12/24/23 1249       Subjective: Eager to discharge tomorrow if possible  Objective: Vitals:   12/27/23 1253 12/27/23 2027 12/28/23 0615 12/28/23 1338  BP: (!) 126/90 (!) 151/79 (!) 169/88 (!) 168/105  Pulse: 73 65 63 (!) 58  Resp: 20 20 20 20   Temp: 99.1 F (37.3 C) 98.3 F (36.8 C) 98.2 F (36.8 C) 98.7 F (37.1 C)  TempSrc: Oral Oral    SpO2: 98% 97% 100% 100%  Weight:      Height:        Intake/Output Summary (Last 24 hours) at 12/28/2023 1516 Last data filed at 12/28/2023 5409 Gross per 24 hour  Intake --  Output 400 ml  Net -400 ml   Filed Weights   12/24/23 1129  Weight: 93.2 kg    Examination:  General: No acute distress. Cardiovascular: RRR Lungs: unlabored Abdomen: Soft, nontender, nondistended Neurological: Alert and appropriate.  Chronic weakness.  Extremities: palpable DP pulses   Data Reviewed: I have personally reviewed following labs and imaging studies  CBC: Recent Labs  Lab 12/24/23 1030 12/25/23 0843 12/26/23 1226 12/27/23 0408 12/28/23 0404  WBC 11.7* 10.1 7.4 5.6 5.4  NEUTROABS 9.1*  --   --   --   --   HGB 14.8 13.3 14.9 13.9 13.2  HCT 45.3 42.5 48.0 43.8 41.4  MCV 98.7 101.7* 103.0* 99.5 100.5*  PLT 171 154 105* 141* 137*    Basic Metabolic Panel: Recent Labs  Lab 12/24/23 1030 12/25/23 0843 12/26/23 1226 12/27/23 0408 12/28/23 0404  NA 140 137 138 136 133*  K 3.8 3.5 3.2* 3.3* 3.3*  CL 106 105 103 103 100  CO2 21* 24 24 24 26   GLUCOSE 113* 98 106* 100* 96  BUN 15 10 10 10 10   CREATININE 1.04 0.81 1.12 0.80 1.04  CALCIUM  9.1 8.4* 8.0* 8.3* 8.1*    GFR: Estimated  Creatinine Clearance: 57.6 mL/min (by C-G formula based on SCr of 1.04 mg/dL).  Liver Function Tests: Recent Labs  Lab 12/24/23 1030 12/25/23 0843  AST 21 18  ALT 13 9  ALKPHOS 67 59  BILITOT 2.0* 1.5*  PROT 7.8 6.9  ALBUMIN 3.6 3.1*    CBG: No results for input(s): "GLUCAP" in the last 168 hours.   Recent Results (from the past 240 hours)  Urine Culture     Status: None   Collection Time: 12/24/23 11:01 AM   Specimen: Urine, Random  Result Value Ref Range Status   Specimen Description   Final    URINE, RANDOM Performed at [facility], [address], [place], [place] [address]    Special Requests   Final    NONE Reflexed from [id number] Performed at [facility], [address], [place], [place] [address]    Culture   Final    NO GROWTH Performed at [facility], [address] [place], [place] [address]    Report Status 12/25/2023 FINAL  Final  Culture, blood (Routine x 2)     Status: Abnormal   Collection Time: 12/24/23 11:22 AM   Specimen: BLOOD  Result Value Ref Range Status   Specimen Description   Final    BLOOD SITE NOT SPECIFIED Performed at [facility], [address] [place], [place] [address]    Special Requests   Final    BOTTLES DRAWN AEROBIC AND ANAEROBIC Blood Culture adequate volume Performed at [facility], [address] [place], [place] [address]    Culture  Setup Time   Final    GRAM POSITIVE COCCI ANAEROBIC BOTTLE ONLY CRITICAL RESULT CALLED TO, READ BACK BY AND VERIFIED WITH: J LEGGE,PHARMD@0714  12/25/23 MK    Culture (Sharay Bellissimo)  Final    STAPHYLOCOCCUS HOMINIS STAPHYLOCOCCUS EPIDERMIDIS THE SIGNIFICANCE OF ISOLATING THIS ORGANISM FROM Sandy [place]ouch SINGLE SET OF BLOOD CULTURES WHEN MULTIPLE SETS ARE DRAWN IS UNCERTAIN. PLEASE NOTIFY THE MICROBIOLOGY DEPARTMENT WITHIN ONE WEEK IF SPECIATION AND SENSITIVITIES ARE REQUIRED. Performed at [facility], [address], [place], [place] [address]    Report Status 12/26/2023 FINAL  Final  Blood Culture ID Panel (Reflexed)     Status: Abnormal   Collection Time: 12/24/23 11:22 AM  Result Value Ref Range Status   Enterococcus faecalis NOT DETECTED NOT DETECTED Final   Enterococcus Faecium NOT DETECTED NOT DETECTED Final   Listeria monocytogenes NOT DETECTED NOT DETECTED Final   Staphylococcus species DETECTED (Thressa Shiffer) NOT DETECTED Final    Comment: CRITICAL RESULT CALLED TO, READ BACK BY AND VERIFIED WITH:  J LEGGE,PHARMD@0715  12/25/23 MK    Staphylococcus aureus (BCID) NOT DETECTED NOT DETECTED Final   Staphylococcus epidermidis DETECTED (Izacc Demeyer) NOT DETECTED Final    Comment: Methicillin (oxacillin) resistant coagulase negative staphylococcus. Possible blood culture contaminant (unless isolated from more than one blood culture draw or clinical case suggests pathogenicity). No antibiotic treatment is indicated for blood  culture contaminants. CRITICAL RESULT CALLED TO, READ BACK BY AND VERIFIED WITH: J LEGGE,PHARMD@0715  12/25/23 MK    Staphylococcus lugdunensis NOT DETECTED NOT DETECTED Final   Streptococcus species NOT DETECTED NOT DETECTED Final   Streptococcus agalactiae NOT DETECTED NOT DETECTED Final   Streptococcus pneumoniae NOT DETECTED NOT DETECTED Final   Streptococcus pyogenes NOT DETECTED NOT DETECTED Final   Damaree Sargent.calcoaceticus-baumannii NOT DETECTED NOT DETECTED Final   Bacteroides fragilis NOT DETECTED NOT DETECTED Final   Enterobacterales NOT DETECTED NOT DETECTED Final   Enterobacter cloacae complex NOT DETECTED NOT DETECTED Final   Escherichia coli NOT DETECTED NOT DETECTED Final   Klebsiella aerogenes NOT DETECTED NOT DETECTED Final   Klebsiella oxytoca NOT DETECTED NOT DETECTED Final   Klebsiella pneumoniae NOT DETECTED NOT DETECTED Final   Proteus species NOT DETECTED NOT DETECTED Final   Salmonella species NOT DETECTED NOT DETECTED Final   Serratia marcescens NOT DETECTED NOT DETECTED Final    Haemophilus influenzae NOT DETECTED NOT DETECTED Final   Neisseria meningitidis NOT DETECTED NOT DETECTED Final   Pseudomonas aeruginosa NOT DETECTED NOT DETECTED Final   Stenotrophomonas maltophilia NOT DETECTED NOT DETECTED Final   Candida albicans NOT DETECTED NOT DETECTED Final   Candida auris NOT DETECTED NOT DETECTED Final   Candida glabrata NOT DETECTED NOT DETECTED Final   Candida krusei NOT DETECTED NOT DETECTED Final   Candida parapsilosis NOT DETECTED NOT DETECTED Final   Candida tropicalis NOT DETECTED NOT DETECTED Final   Cryptococcus neoformans/gattii NOT DETECTED  NOT DETECTED Final   Methicillin resistance mecA/C DETECTED (Griffith Santilli) NOT DETECTED Final    Comment: CRITICAL RESULT CALLED TO, READ BACK BY AND VERIFIED WITH: J LEGGE,PHARMD@0715  12/25/23 MK Performed at [facility], [address] [place], [place] [address]   Resp panel by RT-PCR (RSV, Flu Laurana Magistro&B, Covid) Anterior Nasal Swab     Status: None   Collection Time: 12/24/23 11:58 AM   Specimen: Anterior Nasal Swab  Result Value Ref Range Status   SARS Coronavirus 2 by RT PCR NEGATIVE NEGATIVE Final    Comment: (NOTE) SARS-CoV-2 target nucleic acids are NOT DETECTED.  The SARS-CoV-2 RNA is generally detectable in upper respiratory specimens during the acute phase of infection. The lowest concentration of SARS-CoV-2 viral copies this assay can detect is 138 copies/mL. Shaneque Merkle negative result does not preclude SARS-Cov-2 infection and should not be used as the sole basis for treatment or other patient management decisions. Shaquna Geigle negative result may occur with  improper specimen collection/handling, submission of specimen other than nasopharyngeal swab, presence of viral mutation(s) within the areas targeted by this assay, and inadequate number of viral copies(<138 copies/mL). Rosemarie Galvis negative result must be combined with clinical observations, patient history, and epidemiological information. The expected result is  Negative.  Fact Sheet for Patients:  BloggerCourse.com  Fact Sheet for Healthcare Providers:  SeriousBroker.it  This test is no t yet approved or cleared by the [place]  FDA and  has been authorized for detection and/or diagnosis of SARS-CoV-2 by FDA under an Emergency Use Authorization (EUA). This EUA will remain  in effect (meaning this test can be used) for the duration of the COVID-19 declaration under Section 564(b)(1) of the Act, 21 U.S.C.section 360bbb-3(b)(1), unless the authorization is terminated  or revoked sooner.       Influenza Leman Martinek by PCR NEGATIVE NEGATIVE Final   Influenza B by PCR NEGATIVE NEGATIVE Final    Comment: (NOTE) The Xpert Xpress SARS-CoV-2/FLU/RSV plus assay is intended as an aid in the diagnosis of influenza from Nasopharyngeal swab specimens and should not be used as Shey Yott sole basis for treatment. Nasal washings and aspirates are unacceptable for Xpert Xpress SARS-CoV-2/FLU/RSV testing.  Fact Sheet for Patients: BloggerCourse.com  Fact Sheet for Healthcare Providers: SeriousBroker.it  This test is not yet approved or cleared by the [place]  FDA and has been authorized for detection and/or diagnosis of SARS-CoV-2 by FDA under an Emergency Use Authorization (EUA). This EUA will remain in effect (meaning this test can be used) for the duration of the COVID-19 declaration under Section 564(b)(1) of the Act, 21 U.S.C. section 360bbb-3(b)(1), unless the authorization is terminated or revoked.     Resp Syncytial Virus by PCR NEGATIVE NEGATIVE Final    Comment: (NOTE) Fact Sheet for Patients: BloggerCourse.com  Fact Sheet for Healthcare Providers: SeriousBroker.it  This test is not yet approved or cleared by the [place]  FDA and has been authorized for detection and/or diagnosis of  SARS-CoV-2 by FDA under an Emergency Use Authorization (EUA). This EUA will remain in effect (meaning this test can be used) for the duration of the COVID-19 declaration under Section 564(b)(1) of the Act, 21 U.S.C. section 360bbb-3(b)(1), unless the authorization is terminated or revoked.  Performed at [facility], [address] [place], [place] [address]   Culture, blood (Routine X 2) w Reflex to ID Panel     Status: None (Preliminary result)   Collection Time: 12/25/23  8:43 AM   Specimen: BLOOD  Result Value Ref  Range Status   Specimen Description   Final    BLOOD BLOOD LEFT ARM AEROBIC BOTTLE ONLY ANAEROBIC BOTTLE ONLY Performed at [facility], [address], [place], [place] [address]    Special Requests   Final    BOTTLES DRAWN AEROBIC AND ANAEROBIC Blood Culture results may not be optimal due to an inadequate volume of blood received in culture bottles Performed at [facility], [address] [place], [place] [address]    Culture   Final    NO GROWTH 3 DAYS Performed at [facility], [address], [place], [place] [address]    Report Status PENDING  Incomplete  Culture, blood (Routine X 2) w Reflex to ID Panel     Status: None (Preliminary result)   Collection Time: 12/26/23 12:26 PM   Specimen: BLOOD  Result Value Ref Range Status   Specimen Description   Final    BLOOD BLOOD LEFT ARM AEROBIC BOTTLE ONLY Performed at [facility], [address] [place], [place] [address]    Special Requests   Final    BOTTLES DRAWN AEROBIC ONLY Blood Culture results may not be optimal due to an inadequate volume of blood received in culture bottles Performed at [facility], [address], [place], [place] [address]    Culture   Final    NO GROWTH 2 DAYS Performed at [facility], [address], [place], [place] [address]    Report Status PENDING  Incomplete   Culture, blood (Routine X 2) w Reflex to ID Panel     Status: None (Preliminary result)   Collection Time: 12/26/23 12:26 PM   Specimen: BLOOD  Result Value Ref Range Status   Specimen Description   Final    BLOOD BLOOD LEFT ARM AEROBIC BOTTLE ONLY Performed at [facility], [address], [place], [place] [address]    Special Requests   Final    BOTTLES DRAWN AEROBIC ONLY Blood Culture results may not be optimal due to an inadequate volume of blood received in culture bottles Performed at [facility], [address], [place], [place] [address]    Culture   Final    NO GROWTH 2 DAYS Performed at [facility], [address], [place], [place] [address]    Report Status PENDING  Incomplete         Radiology Studies: CT ABDOMEN PELVIS WO CONTRAST Result Date: 12/26/2023 CLINICAL DATA:  Sepsis, fevers, UTI and hematuria. Check for renal abscess or noncontrast findings of pyelonephritis. EXAM: CT ABDOMEN AND PELVIS WITHOUT CONTRAST TECHNIQUE: Multidetector CT imaging of the abdomen and pelvis was performed following the standard protocol without IV contrast. RADIATION DOSE REDUCTION: This exam was performed according to the departmental dose-optimization program which includes automated exposure control, adjustment of the mA and/or kV according to patient size and/or use of iterative reconstruction technique. COMPARISON:  None Available. FINDINGS: Lower chest: Asymmetric elevation right hemidiaphragm noted in Charron Coultas minimal layering right pleural effusion. There is mild posterior atelectasis both lung bases. There is Brookelyn Gaynor densely calcified granuloma in the lingular base. Above this level there is Rakwon Letourneau 5 mm noncalcified lingular nodule laterally on 4:20. There is mild subpleural reticulation over portions of both lung bases with single layer up to 3 layer honeycombing in the anterior and medial lingula. Lung bases are otherwise clear. There is mild  cardiomegaly, moderate to severe calcifications and thickening of the  aortic valve leaflets, scattered calcific plaques in the LAD and circumflex coronary arteries. There is no pericardial effusion. Loop recorder device noted medial left chest wall and bilateral discoid gynecomastia. Hepatobiliary: No focal liver abnormality is seen without contrast. No gallstones, gallbladder wall thickening, or biliary dilatation. Pancreas: The unremarkable without contrast. Spleen: Unremarkable without contrast. Adrenals/Urinary Tract: There is no adrenal mass. There is moderate streak artifact over the kidneys from the patient's arms in the field. There are Bosniak 1 cysts in the posterior right kidney, on 3:39 measuring 1.7 cm and -17 Hounsfield units laterally, and 1.3 cm and -11 Hounsfield units medially. On the left, there is Diamonique Ruedas Bosniak 2 cyst laterally at the midpole measuring 1.5 cm and 22 Hounsfield units, another medially measuring 1.7 cm and 8 Hounsfield units both on 3:40. There is Maveryck Bahri left upper pole Bosniak 1 cyst measuring 1.8 cm and 15 Hounsfield units. No follow-up imaging is recommended for the findings. There is symmetric perinephric stranding which could be senescent and chronic or due to pyelonephritis. There is no urinary stone or obstruction. There is circumferential bladder thickening with perivesical stranding consistent with cystitis. The wall thickening could in part be due to mass effect from the prostate gland. Stomach/Bowel: Unremarkable gastric wall. Normal caliber small bowel. No inflammatory changes. Normal appendix. Moderate fecal stasis. This includes in the rectum. There is mild rectal thickening and adjacent stranding, unclear if this is due to dependent pelvic congestive change or proctitis. Rest of the colon wall is normal in thickness. Vascular/Lymphatic: Moderate to heavy aortoiliac atherosclerosis. There is Tyshawna Alarid fusiform tripartite infrarenal AAA. The upper component measures 3.0 cm on 6:48. The  mid component is 3.5 cm also 6:48. The bottom component, just above the iliac bifurcation is 5.3 x 4.7 cm on 6:43 and 7:84. 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 Tortuosity and ectasia of the remaining iliac arteries. No abdominal or pelvic adenopathy is seen. Reproductive: Enlarged prostate, measures 5.5 cm with impression into the bladder base. Normal seminal vesicles. Other: Trace presacral ascites. Small umbilical fat hernia. No free hemorrhage, free air, abscess or incarcerated hernia. Mild body wall edema in both flanks and thighs. Musculoskeletal: Osteopenia and degenerative change lower thoracic and lumbar spine, mild hip DJD. Bridging osteophytes right SI joint. No acute or other significant osseous findings. IMPRESSION: 1. Circumferential bladder thickening with perivesical stranding consistent with cystitis. The wall thickening could in part be due to mass effect from the prostate gland. 2. Symmetric perinephric stranding which could be senescent and chronic or due to pyelonephritis. No urinary stone or obstruction. 3. Constipation with mild rectal thickening and adjacent stranding, unclear if this is due to dependent pelvic congestive change or proctitis. 4. Cardiomegaly with calcifications and thickening of the aortic valve leaflets. Echocardiography recommended. 5. Aortic and coronary artery atherosclerosis. 6. 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 Recommend CTA or MRA, as appropriate, in 6 months and referral to Darrol Brandenburg vascular specialist. Reference: Journal of Vascular Surgery 67.1 (2018): 2-77. J Am Coll Radiol 2013;10:789-794. 7. Minimal layering right pleural effusion. 8. 5 mm noncalcified lingular nodule. No follow-up needed if patient is low-risk.This recommendation follows the consensus statement: Guidelines for Management of  Incidental Pulmonary Nodules Detected on CT Images: From the Fleischner Society 2017; Radiology 2017; 284:228-243. 9. Mild body wall edema in the flanks and thighs. 10.  Umbilical fat hernia. 11. Osteopenia and degenerative change. Aortic Atherosclerosis (ICD10-I70.0). Electronically Signed   By: Denman Fischer M.D.   On: 12/26/2023 21:06        Scheduled Meds:  atorvastatin   40 mg Oral QHS   baclofen   10 mg Oral BID   brimonidine   1 drop Both Eyes BID   dorzolamide -timolol   1 drop Both Eyes BID   gabapentin   600 mg Oral TID   latanoprost   1 drop Both Eyes QHS   melatonin  5 mg Oral QHS   polyethylene glycol  17 g Oral BID   senna  1 tablet Oral BID   [START ON 12/31/2023] Vitamin D  (Ergocalciferol )  50,000 Units Oral Q Sun   Continuous Infusions:  cefTRIAXone  (ROCEPHIN )  IV 1 g (12/28/23 1141)     LOS: 4 days    Time spent: over 30 min    Donnetta Gains, MD [facility]   To contact the attending provider between 7A-7P or the covering provider during after hours 7P-7A, please log into the web site [email or web address] and access using universal [place] password for that web site. If you do not have the password, please call the hospital operator.  12/28/2023, 3:16 PM

## 2023-12-28 NOTE — Plan of Care (Signed)

## 2023-12-28 NOTE — TOC Progression Note (Signed)
 Transition of Care [facility]) - Progression Note   Patient Details  Name: Steven Kent MRN: [id number] Date of Birth: November 23, 1934  Transition of Care [facility]) CM/SW Contact  Zenon Hilda, LCSW Phone Number: 12/28/2023, 9:18 AM  Clinical Narrative: Patient is expected to possibly be medically ready tomorrow. CSW updated Crystal in admissions at [facility].  Expected Discharge Plan: Long Term Nursing Home Barriers to Discharge: Continued Medical Work up  Expected Discharge Plan and Services In-house Referral: Clinical Social Work Post Acute Care Choice: Nursing Home Living arrangements for the past 2 months: Skilled Nursing Facility            DME Arranged: N/A DME Agency: NA  Social Determinants of Health (SDOH) Interventions SDOH Screenings   Food Insecurity: No Food Insecurity (12/24/2023)  Housing: Low Risk  (12/24/2023)  Transportation Needs: No Transportation Needs (12/24/2023)  Utilities: Not At Risk (12/24/2023)  Depression (PHQ2-9): Low Risk  (02/28/2020)  Financial Resource Strain: Low Risk  (02/28/2020)  Physical Activity: Inactive (02/28/2020)  Social Connections: Unknown (12/24/2023)  Stress: No Stress Concern Present (02/28/2020)  Tobacco Use: Medium Risk (12/24/2023)   Readmission Risk Interventions    08/04/2022   11:18 AM  Readmission Risk Prevention Plan  Post Dischage Appt Complete  Medication Screening Complete  Transportation Screening Complete

## 2023-12-29 ENCOUNTER — Inpatient Hospital Stay (HOSPITAL_COMMUNITY)

## 2023-12-29 DIAGNOSIS — R6510 Systemic inflammatory response syndrome (SIRS) of non-infectious origin without acute organ dysfunction: Secondary | ICD-10-CM

## 2023-12-29 DIAGNOSIS — R008 Other abnormalities of heart beat: Secondary | ICD-10-CM

## 2023-12-29 LAB — CBC WITH DIFFERENTIAL/PLATELET
Abs Immature Granulocytes: 0.02 10*3/uL (ref 0.00–0.07)
Basophils Absolute: 0 10*3/uL (ref 0.0–0.1)
Basophils Relative: 1 %
Eosinophils Absolute: 0.2 10*3/uL (ref 0.0–0.5)
Eosinophils Relative: 4 %
HCT: 40.5 % (ref 39.0–52.0)
Hemoglobin: 13.1 g/dL (ref 13.0–17.0)
Immature Granulocytes: 0 %
Lymphocytes Relative: 31 %
Lymphs Abs: 1.8 10*3/uL (ref 0.7–4.0)
MCH: 31.6 pg (ref 26.0–34.0)
MCHC: 32.3 g/dL (ref 30.0–36.0)
MCV: 97.8 fL (ref 80.0–100.0)
Monocytes Absolute: 0.8 10*3/uL (ref 0.1–1.0)
Monocytes Relative: 14 %
Neutro Abs: 3 10*3/uL (ref 1.7–7.7)
Neutrophils Relative %: 50 %
Platelets: 153 10*3/uL (ref 150–400)
RBC: 4.14 MIL/uL — ABNORMAL LOW (ref 4.22–5.81)
RDW: 13.7 % (ref 11.5–15.5)
WBC: 5.8 10*3/uL (ref 4.0–10.5)
nRBC: 0 % (ref 0.0–0.2)

## 2023-12-29 LAB — COMPREHENSIVE METABOLIC PANEL WITH GFR
ALT: 19 U/L (ref 0–44)
AST: 28 U/L (ref 15–41)
Albumin: 2.7 g/dL — ABNORMAL LOW (ref 3.5–5.0)
Alkaline Phosphatase: 60 U/L (ref 38–126)
Anion gap: 5 (ref 5–15)
BUN: 12 mg/dL (ref 8–23)
CO2: 24 mmol/L (ref 22–32)
Calcium: 8.3 mg/dL — ABNORMAL LOW (ref 8.9–10.3)
Chloride: 107 mmol/L (ref 98–111)
Creatinine, Ser: 0.96 mg/dL (ref 0.61–1.24)
GFR, Estimated: >60 mL/min (ref >60)
Glucose, Bld: 83 mg/dL (ref 70–99)
Potassium: 4 mmol/L (ref 3.5–5.1)
Sodium: 136 mmol/L (ref 135–145)
Total Bilirubin: 0.8 mg/dL (ref 0.0–1.2)
Total Protein: 6.4 g/dL — ABNORMAL LOW (ref 6.5–8.1)

## 2023-12-29 LAB — ECHOCARDIOGRAM COMPLETE
AR max vel: 1.36 cm2
AV Area VTI: 1.31 cm2
AV Area mean vel: 1.18 cm2
AV Mean grad: 12 mmHg
AV Peak grad: 22.3 mmHg
Ao pk vel: 2.36 m/s
Area-P 1/2: 1.88 cm2
Calc EF: 59.9 %
Height: 73 in
P 1/2 time: 565 ms
S' Lateral: 4.2 cm
Single Plane A2C EF: 55.6 %
Single Plane A4C EF: 63.8 %
Weight: 3287.5 [oz_av]

## 2023-12-29 LAB — PHOSPHORUS: Phosphorus: 2.6 mg/dL (ref 2.5–4.6)

## 2023-12-29 LAB — MAGNESIUM: Magnesium: 1.9 mg/dL (ref 1.7–2.4)

## 2023-12-29 MED ORDER — AMOXICILLIN-POT CLAVULANATE 875-125 MG PO TABS
1.0000 | ORAL_TABLET | Freq: Two times a day (BID) | ORAL | Status: AC
Start: 2023-12-29 — End: 2023-12-31

## 2023-12-29 MED ORDER — GABAPENTIN 300 MG PO CAPS: 600.0000 mg | ORAL_CAPSULE | Freq: Three times a day (TID) | ORAL | Status: AC

## 2023-12-29 NOTE — Progress Notes (Signed)
*  PRELIMINARY RESULTS* Echocardiogram 2D Echocardiogram has been performed.  Deone Leifheit D Felicidad Sugarman 12/29/2023, 9:56 AM

## 2023-12-29 NOTE — NC FL2 (Signed)
 [place]  MEDICAID FL2 LEVEL OF CARE FORM     IDENTIFICATION  Patient Name: Steven Kent Birthdate: 1934/10/22 Sex: male Admission Date (Current Location): 12/24/2023  [facility] and [place] Number:  Producer, television/film/video and Address:  [facility],  [address] [place], [place] [address]      Provider Number: [id number]  Attending Physician Name and Address:  Etter Hermann., *  Relative Name and Phone Number:  Saron Tweed (Spouse)  [phone]    Current Level of Care: Hospital Recommended Level of Care: Nursing Facility Prior Approval Number:    Date Approved/Denied:   PASRR Number:    Discharge Plan: SNF    Current Diagnoses: Patient Active Problem List   Diagnosis Date Noted   Sepsis (HCC) 12/24/2023   SIRS (systemic inflammatory response syndrome) (HCC) 12/24/2023   UTI (urinary tract infection) 12/24/2023   Acute encephalopathy 12/09/2023   COVID-19 08/03/2022   Influenza A 08/02/2022   COVID-19 virus infection 08/02/2022   Paroxysmal atrial fibrillation (HCC) 08/02/2022   Cognitive impairment 08/02/2022   AKI (acute kidney injury) (HCC) 08/02/2022   History of stroke 07/27/2021   History of loop recorder 07/27/2021   Wound of right leg 12/29/2020   Pressure injury of skin 12/13/2020   Cellulitis of leg, right 12/11/2020   Pulmonary vascular congestion 12/11/2020   Rhabdomyolysis 12/11/2020   Lactic acid acidosis 12/11/2020   Essential hypertension 08/16/2018   Hypertensive kidney disease with chronic kidney disease stage III 08/16/2018   VTE (venous thromboembolism) 08/16/2018   Peripheral neuropathy 08/16/2018   Glaucoma 08/16/2018   Onychomycosis 08/16/2018   Leg edema 08/16/2018   Hyperlipidemia 08/16/2018   Overactive bladder 08/16/2018   Coronary artery disease involving native heart without angina pectoris 08/16/2018   Hemiparesis affecting right side as late effect of cerebrovascular accident [facility])    Abdominal aortic  aneurysm (AAA) (HCC)     Orientation RESPIRATION BLADDER Height & Weight     Self  Normal Incontinent Weight: 205 lb 7.5 oz (93.2 kg) Height:  6\' 1"  (185.4 cm)  BEHAVIORAL SYMPTOMS/MOOD NEUROLOGICAL BOWEL NUTRITION STATUS      Incontinent Diet (Heart healthy diet)  AMBULATORY STATUS COMMUNICATION OF NEEDS Skin   Extensive Assist Verbally Other (Comment), Skin abrasions (Abrasion: scrotum; Erythema: buttocks)                       Personal Care Assistance Level of Assistance  Bathing, Feeding, Dressing Bathing Assistance: Maximum assistance Feeding assistance: Limited assistance Dressing Assistance: Maximum assistance     Functional Limitations Info  Sight, Hearing, Speech Sight Info: Impaired Hearing Info: Impaired Speech Info: Adequate    SPECIAL CARE FACTORS FREQUENCY                       Contractures Contractures Info: Not present    Additional Factors Info  Code Status Code Status Info: Full Allergies Info: Aspirin Psychotropic Info: See discharge summary         Current Medications (12/29/2023):  This is the current hospital active medication list Current Facility-Administered Medications  Medication Dose Route Frequency Provider Last Rate Last Admin   acetaminophen  (TYLENOL ) tablet 650 mg  650 mg Oral Q6H PRN Rai, Ripudeep K, MD   650 mg at 12/29/23 0418   Or   acetaminophen  (TYLENOL ) suppository 650 mg  650 mg Rectal Q6H PRN Rai, Ripudeep K, MD       atorvastatin  (LIPITOR) tablet 40 mg  40 mg Oral QHS Rai, Ripudeep K, MD   40 mg at 12/28/23 2052   baclofen  (LIORESAL ) tablet 10 mg  10 mg Oral BID Rai, Ripudeep K, MD   10 mg at 12/29/23 0102   brimonidine  (ALPHAGAN ) 0.15 % ophthalmic solution 1 drop  1 drop Both Eyes BID Rai, Ripudeep K, MD   1 drop at 12/29/23 7253   cefTRIAXone  (ROCEPHIN ) 1 g in sodium chloride  0.9 % 100 mL IVPB  1 g Intravenous Q24H Etter Hermann., MD 200 mL/hr at 12/29/23 1203 1 g at 12/29/23 1203   dorzolamide -timolol   (COSOPT ) 2-0.5 % ophthalmic solution 1 drop  1 drop Both Eyes BID Rai, Ripudeep K, MD   1 drop at 12/29/23 6644   gabapentin  (NEURONTIN ) capsule 600 mg  600 mg Oral TID Etter Hermann., MD   600 mg at 12/29/23 0347   latanoprost  (XALATAN ) 0.005 % ophthalmic solution 1 drop  1 drop Both Eyes QHS Rai, Ripudeep K, MD   1 drop at 12/28/23 2053   melatonin tablet 5 mg  5 mg Oral QHS Rai, Ripudeep K, MD   5 mg at 12/28/23 2052   ondansetron  (ZOFRAN ) tablet 4 mg  4 mg Oral Q6H PRN Rai, Ripudeep K, MD       Or   ondansetron  (ZOFRAN ) injection 4 mg  4 mg Intravenous Q6H PRN Rai, Ripudeep K, MD       polyethylene glycol (MIRALAX / GLYCOLAX) packet 17 g  17 g Oral Daily PRN Rai, Ripudeep K, MD       polyethylene glycol (MIRALAX / GLYCOLAX) packet 17 g  17 g Oral BID Etter Hermann., MD       senna [facility]) tablet 8.6 mg  1 tablet Oral BID Rai, Ripudeep K, MD   8.6 mg at 12/26/23 2300   [START ON 12/31/2023] Vitamin D  (Ergocalciferol ) (DRISDOL ) 1.25 MG (50000 UNIT) capsule 50,000 Units  50,000 Units Oral Q Christiana Cower, Hurman Maiden, MD         Discharge Medications: Please see discharge summary for a list of discharge medications.  Relevant Imaging Results:  Relevant Lab Results:   Additional Information SSN: [id number]  Zenon Hilda, LCSW

## 2023-12-29 NOTE — Discharge Summary (Addendum)
 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  Follow his wounds outpatient - close attention to R heel decubitus and scrotal wounds Follow final echo report outpatient    Discharge Diagnoses:  Principal Problem:   SIRS (systemic inflammatory response syndrome) (HCC) Active Problems:   Hemiparesis affecting right side as late effect of cerebrovascular accident [facility])   Essential hypertension   Pressure injury of skin   Paroxysmal atrial fibrillation (HCC)   Acute encephalopathy   UTI (urinary tract infection)   Discharge Condition: stable  Diet recommendation: heart healthy  Filed Weights   12/24/23 1129  Weight: 93.2 kg    History of present illness:   89 y.o. male with PMH significant of HTN, AAA, CVA with right-sided weakness, CAD, paroxysmal Mabell Esguerra-fib on Xarelto , dementia chronic cognitive impairment with recent hospitalization 12/09/2023-12/12/2023 for AKI, acute metabolic encephalopathy (concern for aspiration pneumonia, was discharged to SNF on Augmentin ).   Patient presented from the [facility] facility with fevers, 102 F at 7:30 AM and received Tylenol .  Patient had reported that his" whole body" hurts but no other symptoms including cough, dysuria, abdominal pain, chest pain or shortness of breath.  Per the nursing staff at the facility, he was too tired to sit up and eat breakfast otherwise no acute symptoms.   He was noted to have hematuria.  He was  admitted and treated for sepsis due to Cydney Alvarenga urinary tract infection.   He's improved with IV antibiotics.  Improved on the day of discharge, stable for outpatient follow up.  See below for additional details   Hospital Course:  Assessment and Plan:  Severe sepsis secondary to UTI, acute metabolic encephalopathy - Patient met SIRS criteria with fevers, mild tachypnea, leukocytosis, acute encephalopathy (superimposed on dementia), secondary to UTI - BC ID + staph species, Staph epidermidis/staph hominis likely contaminants, continue to follow final blood cultures - Urine culture showed no growth - CT with circumferential bladder thickening with perivesical stranding c/w cystitis, symmetric perinephric stranding (chronic or due to pyelo), constipation with mild rectal thickening and adjacent stranding (dependent pelvic congestive change vs proctitis) (see report) - last fever was 5/26 PM  - repeat 5/27 blood cultures NGx2 - urine culture 5/25 NG - s/p ceftriaxone  x5 days.  Will send with additional 2 days augmentin .     Acute metabolic encephalopathy superimposed on dementia - Continue donepezil , treat UTI as above - Delirium precautions, fall precautions, aspiration precautions   Hematuria - In the setting of UTI and Xarelto  - Xarelto  held, hematuria improving - Stable     Hemiparesis affecting right side as late effect of cerebrovascular accident (HCC) - Continue Lipitor - xarelto  - baclofen , gabapentin  (has chronic LE pain, exquisitely ttp - allodynia - suspect this is neuropathic pain after his stroke- will increase gabapentin  dose - monitor renal function for appropriateness of dosing)   Calcifications and thickening of the aortic valve leaflets Will update echo (pending at the time of discharge, follow outpatient)   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 Needs repeat CTA or MRA in  6 months and follow up with vascular   Mild Body  Wall Edema Monitor Follow echo - EF 70-75%, no RWMA, grade II diastolic dysfunction - moderate calcification of aortic valve, mild AV stenosis     Essential hypertension - continue to monitor, not on meds   History of glaucoma - Continue eyedrops including Alphagan , Cosopt , Xalatan      Paroxysmal atrial fibrillation (HCC) - Rate controlled, - will plan to resume xarelto  at discharge, Hb stable, hematuria improving   Right deep tissue heel pressure injury Scrotal Ulcerations - POA - Wound care per instructions below, follow closely outpatient     Procedures: Echo IMPRESSIONS     1. Left ventricular ejection fraction, by estimation, is 70 to 75%. The  left ventricle has hyperdynamic function. The left ventricle has no  regional wall motion abnormalities. There is moderate left ventricular  hypertrophy. Left ventricular diastolic  parameters are consistent with Grade II diastolic dysfunction  (pseudonormalization).   2. Right ventricular systolic function is normal. The right ventricular  size is normal.   3. Left atrial size was mildly dilated.   4. The mitral valve is normal in structure. No evidence of mitral valve  regurgitation. No evidence of mitral stenosis.   5. The aortic valve is normal in structure. There is moderate  calcification of the aortic valve. Aortic valve regurgitation is mild.  Mild aortic valve stenosis. Aortic regurgitation PHT measures 565 msec.  Aortic valve Vmax measures 2.36 m/s.   6. The inferior vena cava is normal in size with greater than 50%  respiratory variability, suggesting right atrial pressure of 3 mmHg.   Consultations: none  Discharge Exam: Vitals:   12/29/23 0250 12/29/23 0425  BP: (!) 175/87   Pulse: 60   Resp: 15 17  Temp: 98.3 F (36.8 C)   SpO2: 99%    No complaints eager to go home Discussed plan with wife  General: chronically ill appearing Cardiovascular: RRR Lungs: unlabored Abdomen: Soft, nontender,  nondistended Neurological: Alert.  R>L sided weakness Extremities: palpable distal DP pulses, exquisite TTP to bilateral LE's  Discharge Instructions   Discharge Instructions     Call MD for:  difficulty breathing, headache or visual disturbances   Complete by: As directed    Call MD for:  extreme fatigue   Complete by: As directed    Call MD for:  hives   Complete by: As directed    Call MD for:  persistant dizziness or light-headedness   Complete by: As directed    Call MD for:  persistant nausea and vomiting   Complete by: As directed    Call MD for:  redness, tenderness, or signs of infection (pain, swelling, redness, odor or green/yellow discharge around incision site)   Complete by: As directed    Call MD for:  severe uncontrolled pain   Complete by: As directed    Call MD for:  temperature >100.4   Complete by: As directed    Diet - low sodium heart healthy   Complete by: As directed    Discharge instructions   Complete by: As directed    You were seen for Rider Ermis urinary tract infection.  You've improved with IV antibiotics.  We'll send you home with Jaymin Waln plan to complete your antibiotic course with augmentin .  You had blood in your urine related to the UTI.  This has improved.  We'll resume your xarelto .  You have significant pain to your legs (and really all over your body)  which seems to be related to your history of stroke.  We increased your gabapentin .  You had an aortic aneurysm and aneurysm of the left common iliac that warrants outpatient vascular follow up and repeat imaging in 6 months.  You have wounds to your right heel and scrotum that warrant additional close follow up outpatient.  Return for new, recurrent, or worsening symptoms.  Please ask your PCP to request records from this hospitalization so they know what was done and what the next steps will be.   Discharge wound care:   Complete by: As directed    1.  Clean scrotum with soap and water, dry and  apply small piece of Xeroform gauze to open areas daily, cover with silicone foam or dry gauze/ABD pad and attempt to secure with mesh underwear. 2.Cleanse R heel with soap and water, dry and apply Xeroform gauze Timm Foot [phone]) to wound bed daily, cover with silicone foam or dry gauze and Kerlix roll guaze. Place R foot in Prevalon boot to offload pressure Timm Foot [phone])   Increase activity slowly   Complete by: As directed       Allergies as of 12/29/2023       Reactions   Aspirin Other (See Comments)   Full Strength Asprin-325 mg causes upset stomach. Can take 81 mg.        Medication List     TAKE these medications    acetaminophen  500 MG tablet Commonly known as: TYLENOL  Take 1,000 mg by mouth 2 (two) times daily.   amoxicillin -clavulanate 875-125 MG tablet Commonly known as: AUGMENTIN  Take 1 tablet by mouth 2 (two) times daily for 2 days.   atorvastatin  40 MG tablet Commonly known as: LIPITOR TAKE 1 TABLET(40 MG) BY MOUTH EVERY EVENING What changed: See the new instructions.   baclofen  10 MG tablet Commonly known as: LIORESAL  Take 1 tablet (10 mg total) by mouth 2 (two) times daily.   barrier cream Crea Commonly known as: non-specified Apply 1 Application topically See admin instructions. Apply topically every shift to maintain skin integrity. Incontinence care.   brimonidine  0.15 % ophthalmic solution Commonly known as: ALPHAGAN  Place 1 drop into both eyes 2 (two) times daily.   chlorhexidine 0.12 % solution Commonly known as: PERIDEX 10 mLs by Mouth Rinse route every Monday, Wednesday, and Friday. Rinse and spit.   donepezil  10 MG tablet Commonly known as: ARICEPT  Take 10 mg by mouth at bedtime.   dorzolamide -timolol  2-0.5 % ophthalmic solution Commonly known as: COSOPT  Place 1 drop into both eyes 2 (two) times daily.   gabapentin  300 MG capsule Commonly known as: NEURONTIN  Take 2 capsules (600 mg total) by mouth 3 (three) times daily. What changed:  how much to take   latanoprost  0.005 % ophthalmic solution Commonly known as: XALATAN  Place 1 drop into both eyes at bedtime.   melatonin 5 MG Tabs Take 5 mg by mouth at bedtime.   Menthol (Topical Analgesic) 5 % Gel Apply 1 application  topically every 12 (twelve) hours as needed (pain).   mineral oil-hydrophilic petrolatum ointment Apply 1 Application topically See admin instructions. Apply topically to legs and feet every other day, may also apply daily as needed for dry skin.   oxybutynin  10 MG 24 hr tablet Commonly known as: DITROPAN -XL Take 10 mg by mouth daily.   potassium chloride  SA 20 MEQ tablet Commonly known as: KLOR-CON  M Take 20 mEq by mouth every other day.   Pro-Stat Liqd Take 30 mLs by mouth daily.  torsemide  20 MG tablet Commonly known as: DEMADEX  Take 1 tablet (20 mg total) by mouth daily as needed. What changed: reasons to take this   Vitamin D  (Ergocalciferol ) 1.25 MG (50000 UNIT) Caps capsule Commonly known as: DRISDOL  TAKE 1 CAPSULE BY MOUTH EVERY 7 DAYS What changed: additional instructions   Xarelto  20 MG Tabs tablet Generic drug: rivaroxaban  TAKE 1 TABLET(20 MG) BY MOUTH EVERY EVENING What changed: See the new instructions.               Discharge Care Instructions  (From admission, onward)           Start     Ordered   12/29/23 0000  Discharge wound care:       Comments: 1.  Clean scrotum with soap and water, dry and apply small piece of Xeroform gauze to open areas daily, cover with silicone foam or dry gauze/ABD pad and attempt to secure with mesh underwear. 2.Cleanse R heel with soap and water, dry and apply Xeroform gauze Timm Foot [phone]) to wound bed daily, cover with silicone foam or dry gauze and Kerlix roll guaze. Place R foot in Prevalon boot to offload pressure Timm Foot [phone])   12/29/23 1203           Allergies  Allergen Reactions   Aspirin Other (See Comments)    Full Strength Asprin-325 mg causes upset  stomach. Can take 81 mg.     Contact information for after-discharge care     Destination     [facility] .   Service: Skilled Nursing Contact information: [address] [place]  [address] [phone]                      The results of significant diagnostics from this hospitalization (including imaging, microbiology, ancillary and laboratory) are listed below for reference.    Significant Diagnostic Studies: CT ABDOMEN PELVIS WO CONTRAST Result Date: 12/26/2023 CLINICAL DATA:  Sepsis, fevers, UTI and hematuria. Check for renal abscess or noncontrast findings of pyelonephritis. EXAM: CT ABDOMEN AND PELVIS WITHOUT CONTRAST TECHNIQUE: Multidetector CT imaging of the abdomen and pelvis was performed following the standard protocol without IV contrast. RADIATION DOSE REDUCTION: This exam was performed according to the departmental dose-optimization program which includes automated exposure control, adjustment of the mA and/or kV according to patient size and/or use of iterative reconstruction technique. COMPARISON:  None Available. FINDINGS: Lower chest: Asymmetric elevation right hemidiaphragm noted in Jae Skeet minimal layering right pleural effusion. There is mild posterior atelectasis both lung bases. There is Jered Heiny densely calcified granuloma in the lingular base. Above this level there is Chastin Garlitz 5 mm noncalcified lingular nodule laterally on 4:20. There is mild subpleural reticulation over portions of both lung bases with single layer up to 3 layer honeycombing in the anterior and medial lingula. Lung bases are otherwise clear. There is mild cardiomegaly, moderate to severe calcifications and thickening of the aortic valve leaflets, scattered calcific plaques in the LAD and circumflex coronary arteries. There is no pericardial effusion. Loop recorder device noted medial left chest wall and bilateral discoid gynecomastia. Hepatobiliary: No focal liver abnormality is seen  without contrast. No gallstones, gallbladder wall thickening, or biliary dilatation. Pancreas: The unremarkable without contrast. Spleen: Unremarkable without contrast. Adrenals/Urinary Tract: There is no adrenal mass. There is moderate streak artifact over the kidneys from the patient's arms in the field. There are Bosniak 1 cysts in the posterior right kidney, on 3:39 measuring 1.7 cm and -17 Hounsfield units laterally,  and 1.3 cm and -11 Hounsfield units medially. On the left, there is Ziair Penson Bosniak 2 cyst laterally at the midpole measuring 1.5 cm and 22 Hounsfield units, another medially measuring 1.7 cm and 8 Hounsfield units both on 3:40. There is Mysti Haley left upper pole Bosniak 1 cyst measuring 1.8 cm and 15 Hounsfield units. No follow-up imaging is recommended for the findings. There is symmetric perinephric stranding which could be senescent and chronic or due to pyelonephritis. There is no urinary stone or obstruction. There is circumferential bladder thickening with perivesical stranding consistent with cystitis. The wall thickening could in part be due to mass effect from the prostate gland. Stomach/Bowel: Unremarkable gastric wall. Normal caliber small bowel. No inflammatory changes. Normal appendix. Moderate fecal stasis. This includes in the rectum. There is mild rectal thickening and adjacent stranding, unclear if this is due to dependent pelvic congestive change or proctitis. Rest of the colon wall is normal in thickness. Vascular/Lymphatic: Moderate to heavy aortoiliac atherosclerosis. There is Gethsemane Fischler fusiform tripartite infrarenal AAA. The upper component measures 3.0 cm on 6:48. The mid component is 3.5 cm also 6:48. The bottom component, just above the iliac bifurcation is 5.3 x 4.7 cm on 6:43 and 7:84. 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 Tortuosity and ectasia of the remaining iliac arteries. No abdominal or pelvic adenopathy is seen. Reproductive:  Enlarged prostate, measures 5.5 cm with impression into the bladder base. Normal seminal vesicles. Other: Trace presacral ascites. Small umbilical fat hernia. No free hemorrhage, free air, abscess or incarcerated hernia. Mild body wall edema in both flanks and thighs. Musculoskeletal: Osteopenia and degenerative change lower thoracic and lumbar spine, mild hip DJD. Bridging osteophytes right SI joint. No acute or other significant osseous findings. IMPRESSION: 1. Circumferential bladder thickening with perivesical stranding consistent with cystitis. The wall thickening could in part be due to mass effect from the prostate gland. 2. Symmetric perinephric stranding which could be senescent and chronic or due to pyelonephritis. No urinary stone or obstruction. 3. Constipation with mild rectal thickening and adjacent stranding, unclear if this is due to dependent pelvic congestive change or proctitis. 4. Cardiomegaly with calcifications and thickening of the aortic valve leaflets. Echocardiography recommended. 5. Aortic and coronary artery atherosclerosis. 6. 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 Recommend CTA or MRA, as appropriate, in 6 months and referral to Makailyn Mccormick vascular specialist. Reference: Journal of Vascular Surgery 67.1 (2018): 2-77. J Am Coll Radiol 2013;10:789-794. 7. Minimal layering right pleural effusion. 8. 5 mm noncalcified lingular nodule. No follow-up needed if patient is low-risk.This recommendation follows the consensus statement: Guidelines for Management of Incidental Pulmonary Nodules Detected on CT Images: From the Fleischner Society 2017; Radiology 2017; 284:228-243. 9. Mild body wall edema in the flanks and thighs. 10. Umbilical fat hernia. 11. Osteopenia and degenerative change. Aortic Atherosclerosis (ICD10-I70.0). Electronically Signed   By: Denman Fischer M.D.   On: 12/26/2023 21:06    DG Chest 2 View Result Date: 12/24/2023 CLINICAL DATA:  Suspected sepsis. EXAM: CHEST - 2 VIEW COMPARISON:  12/09/2023 FINDINGS: Patient's arms overlie the lateral film. Lungs are hypoinflated without focal airspace consolidation or effusion. Mild stable cardiomegaly. Loop recorder projects over the left heart. Remainder of the exam is unchanged. IMPRESSION: 1. No active cardiopulmonary disease. 2. Mild stable cardiomegaly. Electronically Signed   By: Roda Cirri M.D.  On: 12/24/2023 11:48   CUP PACEART REMOTE DEVICE CHECK Result Date: 12/18/2023 ILR summary report received. Battery status OK. Normal device function. No new symptom, tachy, brady, or pause episodes. No new AF episodes. Monthly summary reports and ROV/PRN - CS, CVRS  US  RENAL Result Date: 12/10/2023 CLINICAL DATA:  Acute kidney injury. EXAM: RENAL / URINARY TRACT ULTRASOUND COMPLETE COMPARISON:  None Available. FINDINGS: Right Kidney: Renal measurements: 9.8 x 6.5 x 5.5 cm = volume: 184.5 mL. Echogenicity within normal limits. No mass or hydronephrosis visualized. Left Kidney: Renal measurements: 9.9 x 5.1 x 5.0 cm = volume: 134.2 mL. Echogenicity within normal limits. No mass or hydronephrosis visualized. Bladder: Appears normal for degree of bladder distention. Bilateral ureteral jets visualized. Other: Exam detail is limited by patient body habitus and contractions involving the right upper extremity. IMPRESSION: No acute findings. No hydronephrosis. Electronically Signed   By: Kimberley Penman M.D.   On: 12/10/2023 05:50   DG Chest Port 1 View Result Date: 12/09/2023 CLINICAL DATA:  Altered mental status. EXAM: PORTABLE CHEST 1 VIEW COMPARISON:  August 02, 2022 FINDINGS: The cardiac silhouette is mildly enlarged and unchanged in size. Nas Wafer radiopaque loop recorder device is seen. There is marked severity calcification of the aortic arch. Low lung volumes are noted with mild infiltrate seen within the left lung base. No pleural effusion  or pneumothorax is identified. Multilevel degenerative changes are present throughout the thoracic spine. IMPRESSION: Low lung volumes with mild left basilar infiltrate. Electronically Signed   By: Virgle Grime M.D.   On: 12/09/2023 19:55   CT HEAD WO CONTRAST ( ) Result Date: 12/09/2023 CLINICAL DATA:  Mental status change, fatigue. EXAM: CT HEAD WITHOUT CONTRAST TECHNIQUE: Contiguous axial images were obtained from the base of the skull through the vertex without intravenous contrast. RADIATION DOSE REDUCTION: This exam was performed according to the departmental dose-optimization program which includes automated exposure control, adjustment of the mA and/or kV according to patient size and/or use of iterative reconstruction technique. COMPARISON:  MRI head 12/12/2020. FINDINGS: Brain: No acute intracranial hemorrhage. Hypoattenuation in the periventricular and subcortical white matter suggestive of extensive chronic microvascular ischemic changes. Remote lacunar infarcts in the bilateral basal ganglia. Remote infarct in the left thalamus which is new since 2022 MRI. No edema, mass effect, or midline shift. The basilar cisterns are patent. Ventricles: Prominence of the ventricles suggesting underlying parenchymal volume loss. Vascular: No hyperdense vessel or unexpected calcification. Skull: No acute or aggressive finding. Orbits: Orbits are symmetric. Sinuses: Mucosal thickening in the bilateral ethmoid sinuses. Complete opacification of the right frontal sinus. Additional scattered mucosal thickening and secretions in the bilateral maxillary and sphenoid sinuses. Other: Mastoid air cells are clear. IMPRESSION: No CT evidence of acute intracranial abnormality. Extensive chronic microvascular ischemic changes. Mild parenchymal volume loss. Remote infarct in the left thalamus is new since MRI in 2022. Paranasal sinus disease as above. Electronically Signed   By: Denny Flack M.D.   On: 12/09/2023 19:44     Microbiology: Recent Results (from the past 240 hours)  Urine Culture     Status: None   Collection Time: 12/24/23 11:01 AM   Specimen: Urine, Random  Result Value Ref Range Status   Specimen Description   Final    URINE, RANDOM Performed at [facility], [address], [place], [place] [address]    Special Requests   Final    NONE Reflexed from [id number] Performed at [facility], [address] [place], [place]  [address]    Culture   Final    NO GROWTH Performed at [facility], [address], [place], [place] [address]    Report Status 12/25/2023 FINAL  Final  Culture, blood (Routine x 2)     Status: Abnormal   Collection Time: 12/24/23 11:22 AM   Specimen: BLOOD  Result Value Ref Range Status   Specimen Description   Final    BLOOD SITE NOT SPECIFIED Performed at [facility], [address] [place], [place] [address]    Special Requests   Final    BOTTLES DRAWN AEROBIC AND ANAEROBIC Blood Culture adequate volume Performed at [facility], [address], [place], [place] [address]    Culture  Setup Time   Final    GRAM POSITIVE COCCI ANAEROBIC BOTTLE ONLY CRITICAL RESULT CALLED TO, READ BACK BY AND VERIFIED WITH: J LEGGE,PHARMD@[id number]  12/25/23 MK    Culture ([facility])  Final    STAPHYLOCOCCUS HOMINIS STAPHYLOCOCCUS EPIDERMIDIS THE SIGNIFICANCE OF ISOLATING THIS ORGANISM FROM Katheryn Culliton SINGLE SET OF BLOOD CULTURES WHEN MULTIPLE SETS ARE DRAWN IS UNCERTAIN. PLEASE NOTIFY THE MICROBIOLOGY DEPARTMENT WITHIN ONE WEEK IF SPECIATION AND SENSITIVITIES ARE REQUIRED. Performed at [facility], [address] [place], [place] [address]    Report Status 12/26/2023 FINAL  Final  Blood Culture ID Panel (Reflexed)     Status: Abnormal   Collection Time: 12/24/23 11:22 AM  Result Value Ref Range Status   Enterococcus faecalis NOT DETECTED NOT DETECTED Final   Enterococcus Faecium NOT DETECTED NOT  DETECTED Final   Listeria monocytogenes NOT DETECTED NOT DETECTED Final   Staphylococcus species DETECTED (Cacey Willow) NOT DETECTED Final    Comment: CRITICAL RESULT CALLED TO, READ BACK BY AND VERIFIED WITH:  J LEGGE,PHARMD@[id number]  12/25/23 MK    Staphylococcus aureus (BCID) NOT DETECTED NOT DETECTED Final   Staphylococcus epidermidis DETECTED (Claudene Gatliff) NOT DETECTED Final    Comment: Methicillin (oxacillin) resistant coagulase negative staphylococcus. Possible blood culture contaminant (unless isolated from more than one blood culture draw or clinical case suggests pathogenicity). No antibiotic treatment is indicated for blood  culture contaminants. CRITICAL RESULT CALLED TO, READ BACK BY AND VERIFIED WITH: J LEGGE,PHARMD@0715  12/25/23 MK    Staphylococcus lugdunensis NOT DETECTED NOT DETECTED Final   Streptococcus species NOT DETECTED NOT DETECTED Final   Streptococcus agalactiae NOT DETECTED NOT DETECTED Final   Streptococcus pneumoniae NOT DETECTED NOT DETECTED Final   Streptococcus pyogenes NOT DETECTED NOT DETECTED Final   Venetta Knee.calcoaceticus-baumannii NOT DETECTED NOT DETECTED Final   Bacteroides fragilis NOT DETECTED NOT DETECTED Final   Enterobacterales NOT DETECTED NOT DETECTED Final   Enterobacter cloacae complex NOT DETECTED NOT DETECTED Final   Escherichia coli NOT DETECTED NOT DETECTED Final   Klebsiella aerogenes NOT DETECTED NOT DETECTED Final   Klebsiella oxytoca NOT DETECTED NOT DETECTED Final   Klebsiella pneumoniae NOT DETECTED NOT DETECTED Final   Proteus species NOT DETECTED NOT DETECTED Final   Salmonella species NOT DETECTED NOT DETECTED Final   Serratia marcescens NOT DETECTED NOT DETECTED Final   Haemophilus influenzae NOT DETECTED NOT DETECTED Final   Neisseria meningitidis NOT DETECTED NOT DETECTED Final   Pseudomonas aeruginosa NOT DETECTED NOT DETECTED Final   Stenotrophomonas maltophilia NOT DETECTED NOT DETECTED Final   Candida albicans NOT DETECTED NOT DETECTED Final    Candida auris NOT DETECTED NOT DETECTED Final   Candida glabrata NOT DETECTED NOT DETECTED Final   Candida krusei NOT DETECTED NOT DETECTED Final   Candida  parapsilosis NOT DETECTED NOT DETECTED Final   Candida tropicalis NOT DETECTED NOT DETECTED Final   Cryptococcus neoformans/gattii NOT DETECTED NOT DETECTED Final   Methicillin resistance mecA/C DETECTED (Ellarie Picking) NOT DETECTED Final    Comment: CRITICAL RESULT CALLED TO, READ BACK BY AND VERIFIED WITH: J LEGGE,PHARMD@0715  12/25/23 MK Performed at [facility], [address], [place], [place] [address]   Resp panel by RT-PCR (RSV, Flu Naiara Lombardozzi&B, Covid) Anterior Nasal Swab     Status: None   Collection Time: 12/24/23 11:58 AM   Specimen: Anterior Nasal Swab  Result Value Ref Range Status   SARS Coronavirus 2 by RT PCR NEGATIVE NEGATIVE Final    Comment: (NOTE) SARS-CoV-2 target nucleic acids are NOT DETECTED.  The SARS-CoV-2 RNA is generally detectable in upper respiratory specimens during the acute phase of infection. The lowest concentration of SARS-CoV-2 viral copies this assay can detect is 138 copies/mL. Draco Malczewski negative result does not preclude SARS-Cov-2 infection and should not be used as the sole basis for treatment or other patient management decisions. Lyrik Dockstader negative result may occur with  improper specimen collection/handling, submission of specimen other than nasopharyngeal swab, presence of viral mutation(s) within the areas targeted by this assay, and inadequate number of viral copies(<138 copies/mL). Emilio Baylock negative result must be combined with clinical observations, patient history, and epidemiological information. The expected result is Negative.  Fact Sheet for Patients:  BloggerCourse.com  Fact Sheet for Healthcare Providers:  SeriousBroker.it  This test is no t yet approved or cleared by the [place]  FDA and  has been authorized for detection and/or diagnosis of SARS-CoV-2  by FDA under an Emergency Use Authorization (EUA). This EUA will remain  in effect (meaning this test can be used) for the duration of the COVID-19 declaration under Section 564(b)(1) of the Act, 21 U.S.C.section 360bbb-3(b)(1), unless the authorization is terminated  or revoked sooner.       Influenza Brandon Scarbrough by PCR NEGATIVE NEGATIVE Final   Influenza B by PCR NEGATIVE NEGATIVE Final    Comment: (NOTE) The Xpert Xpress SARS-CoV-2/FLU/RSV plus assay is intended as an aid in the diagnosis of influenza from Nasopharyngeal swab specimens and should not be used as Tore Carreker sole basis for treatment. Nasal washings and aspirates are unacceptable for Xpert Xpress SARS-CoV-2/FLU/RSV testing.  Fact Sheet for Patients: BloggerCourse.com  Fact Sheet for Healthcare Providers: SeriousBroker.it  This test is not yet approved or cleared by the [place]  FDA and has been authorized for detection and/or diagnosis of SARS-CoV-2 by FDA under an Emergency Use Authorization (EUA). This EUA will remain in effect (meaning this test can be used) for the duration of the COVID-19 declaration under Section 564(b)(1) of the Act, 21 U.S.C. section 360bbb-3(b)(1), unless the authorization is terminated or revoked.     Resp Syncytial Virus by PCR NEGATIVE NEGATIVE Final    Comment: (NOTE) Fact Sheet for Patients: BloggerCourse.com  Fact Sheet for Healthcare Providers: SeriousBroker.it  This test is not yet approved or cleared by the [place]  FDA and has been authorized for detection and/or diagnosis of SARS-CoV-2 by FDA under an Emergency Use Authorization (EUA). This EUA will remain in effect (meaning this test can be used) for the duration of the COVID-19 declaration under Section 564(b)(1) of the Act, 21 U.S.C. section 360bbb-3(b)(1), unless the authorization is terminated or revoked.  Performed at  [facility], [address], [place], [place] [address]   Culture, blood (Routine X 2) w Reflex to ID Panel  Status: None (Preliminary result)   Collection Time: 12/25/23  8:43 AM   Specimen: BLOOD  Result Value Ref Range Status   Specimen Description   Final    BLOOD BLOOD LEFT ARM AEROBIC BOTTLE ONLY ANAEROBIC BOTTLE ONLY Performed at [facility], [address] [place], [place] [address]    Special Requests   Final    BOTTLES DRAWN AEROBIC AND ANAEROBIC Blood Culture results may not be optimal due to an inadequate volume of blood received in culture bottles Performed at [facility] [address] [place], [place] [address]    Culture   Final    NO GROWTH 4 DAYS Performed at [facility], [address] [place], [place] [address]    Report Status PENDING  Incomplete  Culture, blood (Routine X 2) w Reflex to ID Panel     Status: None (Preliminary result)   Collection Time: 12/26/23 12:26 PM   Specimen: BLOOD  Result Value Ref Range Status   Specimen Description   Final    BLOOD BLOOD LEFT ARM AEROBIC BOTTLE ONLY Performed at [facility], [address], [place], [place] [address]    Special Requests   Final    BOTTLES DRAWN AEROBIC ONLY Blood Culture results may not be optimal due to an inadequate volume of blood received in culture bottles Performed at [facility], [address] [place], [place] [address]    Culture   Final    NO GROWTH 3 DAYS Performed at [facility], [address], [place], [place] [address]    Report Status PENDING  Incomplete  Culture, blood (Routine X 2) w Reflex to ID Panel     Status: None (Preliminary result)   Collection Time: 12/26/23 12:26 PM   Specimen: BLOOD  Result Value Ref Range Status   Specimen Description   Final    BLOOD BLOOD LEFT ARM AEROBIC BOTTLE ONLY Performed at [facility], [address] [place], [place] [address]    Special Requests   Final    BOTTLES DRAWN AEROBIC ONLY Blood Culture results may not be optimal due to an inadequate volume of blood received in culture bottles Performed at [facility], [address] [place], [place] [address]    Culture   Final    NO GROWTH 3 DAYS Performed at [facility], [address], [place], [place] [address]    Report Status PENDING  Incomplete     Labs: Basic Metabolic Panel: Recent Labs  Lab 12/25/23 0843 12/26/23 1226 12/27/23 0408 12/28/23 0404 12/29/23 0341  NA 137 138 136 133* 136  K 3.5 3.2* 3.3* 3.3* 4.0  CL 105 103 103 100 107  CO2 24 24 24 26 24   GLUCOSE 98 106* 100* 96 83  BUN 10 10 10 10 12   CREATININE 0.81 1.12 0.80 1.04 0.96  CALCIUM  8.4* 8.0* 8.3* 8.1* 8.3*  MG  --   --   --   --  1.9  PHOS  --   --   --   --  2.6   Liver Function Tests: Recent Labs  Lab 12/24/23 1030 12/25/23 0843 12/29/23 0341  AST 21 18 28   ALT 13 9 19   ALKPHOS 67 59 60  BILITOT 2.0* 1.5* 0.8  PROT 7.8 6.9 6.4*  ALBUMIN 3.6 3.1* 2.7*   No results for input(s): "LIPASE", "AMYLASE" in the last 168 hours. No results  for input(s): "AMMONIA" in the last 168 hours. CBC: Recent Labs  Lab 12/24/23 1030 12/25/23 0843 12/26/23 1226 12/27/23 0408 12/28/23 0404 12/29/23 0341  WBC 11.7* 10.1 7.4 5.6 5.4 5.8  NEUTROABS 9.1*  --   --   --   --  3.0  HGB 14.8 13.3 14.9 13.9 13.2 13.1  HCT 45.3 42.5 48.0 43.8 41.4 40.5  MCV 98.7 101.7* 103.0* 99.5 100.5* 97.8  PLT 171 154 105* 141* 137* 153   Cardiac Enzymes: Recent Labs  Lab 12/24/23 1030  CKTOTAL 109   BNP: BNP (last 3 results) No results for input(s): "BNP" in the last 8760 hours.  ProBNP (last 3 results) No results for input(s): "PROBNP" in the last 8760 hours.  CBG: No results for input(s): "GLUCAP" in the last 168 hours.     Signed:  Donnetta Gains MD.  [facility] 12/29/2023, 12:04 PM

## 2023-12-29 NOTE — TOC Transition Note (Addendum)
 Transition of Care [facility]) - Discharge Note  Patient Details  Name: Steven Kent MRN: [id number] Date of Birth: 03/21/1935  [facility] CM/SW Contact:  Zenon Hilda, LCSW Phone Number: 12/29/2023, 2:20 PM  Clinical Narrative: Patient is medically ready to return to [facility]. CSW confirmed patient's return with [facility] in admissions. Patient will go to room 409B and the number for report is [phone]. FL2 done. Discharge summary, discharge orders, FL2, and SNF transfer report faxed to facility in hub. Medical necessity form done; PTAR scheduled. Discharge packet completed. CSW unable reach spouse, so voicemail was left regarding discharge. TOC signing off.  Final next level of care: Long Term Nursing Home Barriers to Discharge: Barriers Resolved  Patient Goals and CMS Choice Patient states their goals for this hospitalization and ongoing recovery are:: Return to [facility] Choice offered to / list presented to : NA  Discharge Placement        Patient chooses bed at: [facility] Patient to be transferred to facility by: PTAR Name of family member notified: Eian Vandervelden (wife) Patient and family notified of of transfer: 12/29/23  Discharge Plan and Services Additional resources added to the After Visit Summary for   In-house Referral: Clinical Social Work Post Acute Care Choice: Nursing Home          DME Arranged: N/A DME Agency: NA  Social Drivers of Health (SDOH) Interventions SDOH Screenings   Food Insecurity: No Food Insecurity (12/24/2023)  Housing: Low Risk  (12/24/2023)  Transportation Needs: No Transportation Needs (12/24/2023)  Utilities: Not At Risk (12/24/2023)  Depression (PHQ2-9): Low Risk  (02/28/2020)  Financial Resource Strain: Low Risk  (02/28/2020)  Physical Activity: Inactive (02/28/2020)  Social Connections: Unknown (12/24/2023)  Stress: No Stress Concern Present (02/28/2020)  Tobacco Use: Medium Risk (12/24/2023)   Readmission Risk  Interventions    08/04/2022   11:18 AM  Readmission Risk Prevention Plan  Post Dischage Appt Complete  Medication Screening Complete  Transportation Screening Complete

## 2023-12-29 NOTE — Progress Notes (Signed)
*  PRELIMINARY RESULTS* Echocardiogram 2D Echocardiogram has been performed.  Jaycen Vercher D Calix Heinbaugh 12/29/2023, 9:55 AM

## 2023-12-30 LAB — CULTURE, BLOOD (ROUTINE X 2): Culture: NO GROWTH

## 2023-12-31 LAB — CULTURE, BLOOD (ROUTINE X 2)
Culture: NO GROWTH
Culture: NO GROWTH

## 2024-01-03 NOTE — Progress Notes (Signed)
 [facility] Summary Report / Loop Recorder

## 2024-01-18 ENCOUNTER — Ambulatory Visit: Payer: Self-pay | Admitting: Cardiovascular Disease

## 2024-01-18 ENCOUNTER — Ambulatory Visit (INDEPENDENT_AMBULATORY_CARE_PROVIDER_SITE_OTHER)

## 2024-01-18 DIAGNOSIS — R55 Syncope and collapse: Secondary | ICD-10-CM | POA: Diagnosis not present

## 2024-01-18 LAB — CUP PACEART REMOTE DEVICE CHECK
Date Time Interrogation Session: 20250618230658
Implantable Pulse Generator Implant Date: 20211027

## 2024-02-08 NOTE — Progress Notes (Signed)
 [facility] Summary Report / Loop Recorder

## 2024-02-19 ENCOUNTER — Ambulatory Visit

## 2024-02-19 DIAGNOSIS — R55 Syncope and collapse: Secondary | ICD-10-CM

## 2024-02-20 LAB — CUP PACEART REMOTE DEVICE CHECK
Date Time Interrogation Session: 20250720230850
Implantable Pulse Generator Implant Date: 20211027

## 2024-02-25 ENCOUNTER — Ambulatory Visit: Payer: Self-pay | Admitting: Cardiovascular Disease

## 2024-03-18 NOTE — Progress Notes (Signed)
 [facility] Summary Report / Loop Recorder

## 2024-03-21 ENCOUNTER — Ambulatory Visit (INDEPENDENT_AMBULATORY_CARE_PROVIDER_SITE_OTHER)

## 2024-03-21 DIAGNOSIS — R55 Syncope and collapse: Secondary | ICD-10-CM

## 2024-03-21 LAB — CUP PACEART REMOTE DEVICE CHECK
Date Time Interrogation Session: 20250820230838
Implantable Pulse Generator Implant Date: 20211027

## 2024-03-25 ENCOUNTER — Ambulatory Visit: Payer: Self-pay | Admitting: Cardiovascular Disease

## 2024-04-22 ENCOUNTER — Ambulatory Visit (INDEPENDENT_AMBULATORY_CARE_PROVIDER_SITE_OTHER)

## 2024-04-22 DIAGNOSIS — R55 Syncope and collapse: Secondary | ICD-10-CM | POA: Diagnosis not present

## 2024-04-22 LAB — CUP PACEART REMOTE DEVICE CHECK
Date Time Interrogation Session: 20250921230810
Implantable Pulse Generator Implant Date: 20211027

## 2024-04-23 NOTE — Progress Notes (Signed)
 Remote Loop Recorder Transmission

## 2024-04-24 NOTE — Progress Notes (Signed)
 Remote Loop Recorder Transmission

## 2024-05-06 NOTE — Progress Notes (Signed)
 Remote Loop Recorder Transmission

## 2024-05-07 ENCOUNTER — Ambulatory Visit: Payer: Self-pay | Admitting: Cardiovascular Disease

## 2024-05-23 ENCOUNTER — Ambulatory Visit

## 2024-05-23 ENCOUNTER — Encounter

## 2024-05-23 DIAGNOSIS — R55 Syncope and collapse: Secondary | ICD-10-CM | POA: Diagnosis not present

## 2024-05-23 LAB — CUP PACEART REMOTE DEVICE CHECK
Date Time Interrogation Session: 20251022230938
Implantable Pulse Generator Implant Date: 20211027

## 2024-05-24 NOTE — Progress Notes (Signed)
 Remote Loop Recorder Transmission

## 2024-06-05 ENCOUNTER — Ambulatory Visit: Payer: Self-pay | Admitting: Cardiovascular Disease

## 2024-06-23 ENCOUNTER — Ambulatory Visit

## 2024-06-24 ENCOUNTER — Encounter

## 2024-06-25 ENCOUNTER — Ambulatory Visit

## 2024-06-25 DIAGNOSIS — R55 Syncope and collapse: Secondary | ICD-10-CM | POA: Diagnosis not present

## 2024-06-26 LAB — CUP PACEART REMOTE DEVICE CHECK
Date Time Interrogation Session: 20251124230533
Implantable Pulse Generator Implant Date: 20211027

## 2024-06-28 NOTE — Progress Notes (Signed)
 Remote Loop Recorder Transmission

## 2024-07-02 ENCOUNTER — Ambulatory Visit: Payer: Self-pay | Admitting: Cardiovascular Disease

## 2024-07-22 ENCOUNTER — Encounter (HOSPITAL_COMMUNITY): Payer: Self-pay | Admitting: Emergency Medicine

## 2024-07-22 ENCOUNTER — Emergency Department (HOSPITAL_COMMUNITY)

## 2024-07-22 ENCOUNTER — Other Ambulatory Visit: Payer: Self-pay

## 2024-07-22 ENCOUNTER — Observation Stay (HOSPITAL_COMMUNITY)
Admission: EM | Admit: 2024-07-22 | Discharge: 2024-07-24 | Disposition: A | Discharge status: Skilled Nursing Facility | Attending: Internal Medicine | Admitting: Internal Medicine

## 2024-07-22 DIAGNOSIS — I110 Hypertensive heart disease with heart failure: Secondary | ICD-10-CM | POA: Insufficient documentation

## 2024-07-22 DIAGNOSIS — Z87891 Personal history of nicotine dependence: Secondary | ICD-10-CM | POA: Diagnosis not present

## 2024-07-22 DIAGNOSIS — I5032 Chronic diastolic (congestive) heart failure: Secondary | ICD-10-CM | POA: Insufficient documentation

## 2024-07-22 DIAGNOSIS — I69351 Hemiplegia and hemiparesis following cerebral infarction affecting right dominant side: Secondary | ICD-10-CM | POA: Diagnosis not present

## 2024-07-22 DIAGNOSIS — R4182 Altered mental status, unspecified: Secondary | ICD-10-CM | POA: Diagnosis present

## 2024-07-22 DIAGNOSIS — G629 Polyneuropathy, unspecified: Secondary | ICD-10-CM | POA: Insufficient documentation

## 2024-07-22 DIAGNOSIS — Z7901 Long term (current) use of anticoagulants: Secondary | ICD-10-CM | POA: Insufficient documentation

## 2024-07-22 DIAGNOSIS — Z79899 Other long term (current) drug therapy: Secondary | ICD-10-CM | POA: Diagnosis not present

## 2024-07-22 DIAGNOSIS — E785 Hyperlipidemia, unspecified: Secondary | ICD-10-CM | POA: Diagnosis not present

## 2024-07-22 DIAGNOSIS — I2510 Atherosclerotic heart disease of native coronary artery without angina pectoris: Secondary | ICD-10-CM | POA: Diagnosis not present

## 2024-07-22 DIAGNOSIS — G9340 Encephalopathy, unspecified: Principal | ICD-10-CM | POA: Diagnosis present

## 2024-07-22 DIAGNOSIS — T481X5A Adverse effect of skeletal muscle relaxants [neuromuscular blocking agents], initial encounter: Secondary | ICD-10-CM

## 2024-07-22 DIAGNOSIS — F0390 Unspecified dementia without behavioral disturbance: Secondary | ICD-10-CM | POA: Diagnosis not present

## 2024-07-22 DIAGNOSIS — R7989 Other specified abnormal findings of blood chemistry: Secondary | ICD-10-CM | POA: Diagnosis not present

## 2024-07-22 DIAGNOSIS — Z8673 Personal history of transient ischemic attack (TIA), and cerebral infarction without residual deficits: Secondary | ICD-10-CM

## 2024-07-22 DIAGNOSIS — I10 Essential (primary) hypertension: Secondary | ICD-10-CM | POA: Diagnosis present

## 2024-07-22 DIAGNOSIS — R001 Bradycardia, unspecified: Secondary | ICD-10-CM | POA: Diagnosis present

## 2024-07-22 DIAGNOSIS — N179 Acute kidney failure, unspecified: Secondary | ICD-10-CM | POA: Diagnosis present

## 2024-07-22 LAB — CBC
HCT: 43.8 % (ref 39.0–52.0)
Hemoglobin: 13.7 g/dL (ref 13.0–17.0)
MCH: 29 pg (ref 26.0–34.0)
MCHC: 31.3 g/dL (ref 30.0–36.0)
MCV: 92.8 fL (ref 80.0–100.0)
Platelets: 192 K/uL (ref 150–400)
RBC: 4.72 MIL/uL (ref 4.22–5.81)
RDW: 17.1 % — ABNORMAL HIGH (ref 11.5–15.5)
WBC: 5.6 K/uL (ref 4.0–10.5)
nRBC: 0 % (ref 0.0–0.2)

## 2024-07-22 LAB — DIFFERENTIAL
Abs Immature Granulocytes: 0.01 K/uL (ref 0.00–0.07)
Basophils Absolute: 0 K/uL (ref 0.0–0.1)
Basophils Relative: 1 %
Eosinophils Absolute: 0.3 K/uL (ref 0.0–0.5)
Eosinophils Relative: 5 %
Immature Granulocytes: 0 %
Lymphocytes Relative: 33 %
Lymphs Abs: 1.9 K/uL (ref 0.7–4.0)
Monocytes Absolute: 0.6 K/uL (ref 0.1–1.0)
Monocytes Relative: 10 %
Neutro Abs: 2.9 K/uL (ref 1.7–7.7)
Neutrophils Relative %: 51 %

## 2024-07-22 LAB — APTT: aPTT: 41 s — ABNORMAL HIGH (ref 24–36)

## 2024-07-22 LAB — COMPREHENSIVE METABOLIC PANEL WITH GFR
ALT: 9 U/L (ref 0–44)
AST: 22 U/L (ref 15–41)
Albumin: 3.6 g/dL (ref 3.5–5.0)
Alkaline Phosphatase: 81 U/L (ref 38–126)
Anion gap: 11 (ref 5–15)
BUN: 17 mg/dL (ref 8–23)
CO2: 25 mmol/L (ref 22–32)
Calcium: 9.3 mg/dL (ref 8.9–10.3)
Chloride: 104 mmol/L (ref 98–111)
Creatinine, Ser: 1.42 mg/dL — ABNORMAL HIGH (ref 0.61–1.24)
GFR, Estimated: 48 mL/min — ABNORMAL LOW
Glucose, Bld: 98 mg/dL (ref 70–99)
Potassium: 4.9 mmol/L (ref 3.5–5.1)
Sodium: 139 mmol/L (ref 135–145)
Total Bilirubin: 0.9 mg/dL (ref 0.0–1.2)
Total Protein: 7.1 g/dL (ref 6.5–8.1)

## 2024-07-22 LAB — URINE DRUG SCREEN
Amphetamines: NEGATIVE
Barbiturates: NEGATIVE
Benzodiazepines: NEGATIVE
Cocaine: NEGATIVE
Fentanyl: NEGATIVE
Methadone Scn, Ur: NEGATIVE
Opiates: NEGATIVE
Tetrahydrocannabinol: NEGATIVE

## 2024-07-22 LAB — TROPONIN T, HIGH SENSITIVITY
Troponin T High Sensitivity: 53 ng/L — ABNORMAL HIGH (ref 0–19)
Troponin T High Sensitivity: 37 ng/L — ABNORMAL HIGH (ref 0–19)

## 2024-07-22 LAB — I-STAT CG4 LACTIC ACID, ED
Lactic Acid, Venous: 1.8 mmol/L (ref 0.5–1.9)
Lactic Acid, Venous: 0.9 mmol/L (ref 0.5–1.9)

## 2024-07-22 LAB — CBG MONITORING, ED: Glucose-Capillary: 97 mg/dL (ref 70–99)

## 2024-07-22 LAB — PROTIME-INR
INR: 2 — ABNORMAL HIGH (ref 0.8–1.2)
Prothrombin Time: 23.3 s — ABNORMAL HIGH (ref 11.4–15.2)

## 2024-07-22 LAB — ETHANOL: Alcohol, Ethyl (B): <15 mg/dL

## 2024-07-22 LAB — PRO BRAIN NATRIURETIC PEPTIDE: Pro Brain Natriuretic Peptide: 1259 pg/mL — ABNORMAL HIGH

## 2024-07-22 NOTE — ED Notes (Signed)
 Patient transported to MRI

## 2024-07-22 NOTE — ED Triage Notes (Signed)
 Pt bib [facility]ms. Ems arrived at 1254, pt was unresponsive on scene with HR 44 and  RR8.  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

## 2024-07-22 NOTE — ED Notes (Signed)
 TO CT room 1 now

## 2024-07-22 NOTE — ED Provider Notes (Signed)
 " [name] [facility] AT [facility] Provider Note   CSN: [id number] Arrival date & time: 07/22/24  1341     Patient presents with: Bradycardia   Steven Kent is a 89 y.o. male.   HPI Patient presents from nursing facility with altered mental status.  History is obtained by EMS, patient does not provide level 5 caveat. Per report, staff at the facility felt the patient was altered.  On EMS arrival he was reportedly mildly bradycardic, received atropine.  Glucose unremarkable, patient did require bagging. History is notable for prior stroke, baseline decreased verbal interactivity. 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    Prior to Admission medications  Medication Sig Start Date End Date Taking? Authorizing Provider  acetaminophen  (TYLENOL ) 500 MG tablet Take 1,000 mg by mouth 2 (two) times daily.    [provider]  Amino Acids-Protein Hydrolys (PRO-STAT) LIQD Take 30 mLs by mouth daily.    [provider]  atorvastatin  (LIPITOR) 40 MG tablet TAKE 1 TABLET(40 MG) BY MOUTH EVERY EVENING Patient taking differently: Take 40 mg by mouth at bedtime. 09/07/20   Kennyth Worth HERO, MD  baclofen  (LIORESAL ) 10 MG tablet Take 1 tablet (10 mg total) by mouth 2 (two) times daily. 12/12/23   Gherghe, Costin M, MD  barrier cream (NON-SPECIFIED) CREA Apply 1 Application topically See admin instructions. Apply topically every shift to maintain skin integrity. Incontinence care.    [provider]  brimonidine  (ALPHAGAN ) 0.15 % ophthalmic solution Place 1 drop into both eyes 2 (two) times daily.    [provider]  chlorhexidine (PERIDEX) 0.12 % solution 10 mLs by Mouth Rinse route every Monday, Wednesday, and Friday. Rinse and spit.    [provider]  donepezil  (ARICEPT ) 10 MG tablet Take 10 mg by mouth at bedtime.    [provider]  dorzolamide -timolol  (COSOPT ) 2-0.5 % ophthalmic solution Place 1 drop into both eyes 2 (two)  times daily.    [provider]  gabapentin  (NEURONTIN ) 300 MG capsule Take 2 capsules (600 mg total) by mouth 3 (three) times daily. 12/29/23   Perri DELENA Meliton Mickey., MD  latanoprost  (XALATAN ) 0.005 % ophthalmic solution Place 1 drop into both eyes at bedtime.    [provider]  melatonin 5 MG TABS Take 5 mg by mouth at bedtime.    [provider]  Menthol, Topical Analgesic, 5 % GEL Apply 1 application  topically every 12 (twelve) hours as needed (pain).    [provider]  mineral oil-hydrophilic petrolatum (AQUAPHOR) ointment Apply 1 Application topically See admin instructions. Apply topically to legs and feet every other day, may also apply daily as needed for dry skin.    [provider]  oxybutynin  (DITROPAN -XL) 10 MG 24 hr tablet Take 10 mg by mouth daily.    [provider]  potassium chloride  SA (KLOR-CON  M) 20 MEQ tablet Take 20 mEq by mouth every other day.    [provider]  torsemide  (DEMADEX ) 20 MG tablet Take 1 tablet (20 mg total) by mouth daily as needed. Patient taking differently: Take 20 mg by mouth daily as needed (weight gain >3 lbs in a day or 5 lbs in a week). 12/12/23   Gherghe, Costin M, MD  Vitamin D , Ergocalciferol , (DRISDOL ) 1.25 MG (50000 UNIT) CAPS capsule TAKE 1 CAPSULE BY MOUTH EVERY 7 DAYS Patient taking differently: Take 50,000 Units by mouth every 7 (seven) days. Friday 12/21/20   Kennyth Worth HERO, MD  XARELTO  20 MG TABS tablet TAKE 1 TABLET(20 MG) BY MOUTH EVERY EVENING Patient taking differently: Take 20 mg by mouth every evening. 09/07/20   Kennyth Worth HERO, MD    Allergies: Aspirin    Review of Systems  Updated Vital Signs BP 118/67   Pulse 72   Temp (!) 97.3 F (36.3 C) (Rectal)   Resp 11   Ht 1.854 m (6' 1)   Wt 93.2 kg   SpO2 99%   BMI 27.11 kg/m   Physical Exam Vitals and nursing note reviewed.  Constitutional:      Appearance: He is well-developed.     Comments: Adult male  nonparticipatory  HENT:     Head: Normocephalic and atraumatic.  Eyes:     Conjunctiva/sclera: Conjunctivae normal.  Cardiovascular:     Rate and Rhythm: Normal rate and regular rhythm.  Pulmonary:     Effort: Pulmonary effort is normal. No respiratory distress.     Breath sounds: No stridor.  Abdominal:     General: There is no distension.  Skin:    General: Skin is warm and dry.  Neurological:     Comments: Right-sided hemiplegia, right eye blindness. Left eye 3 mm, minimally reactive Patient nonverbal     (all labs ordered are listed, but only abnormal results are displayed) Labs Reviewed  PROTIME-INR - Abnormal; Notable for the following components:      Result Value   Prothrombin Time 23.3 (*)    INR 2.0 (*)    All other components within normal limits  APTT - Abnormal; Notable for the following components:   aPTT 41 (*)    All other components within normal limits  CBC - Abnormal; Notable for the following components:   RDW 17.1 (*)    All other components within normal limits  PRO BRAIN NATRIURETIC PEPTIDE - Abnormal; Notable for the following components:   Pro Brain Natriuretic Peptide 1,259.0 (*)    All other components within normal limits  TROPONIN T, HIGH SENSITIVITY - Abnormal; Notable for the following components:   Troponin T High Sensitivity 53 (*)    All other components within normal limits  DIFFERENTIAL  ETHANOL  COMPREHENSIVE METABOLIC PANEL WITH GFR  URINE DRUG SCREEN  CBG MONITORING, ED  I-STAT CG4 LACTIC ACID, ED    EKG: EKG Interpretation Date/Time:  Monday July 22 2024 14:36:12 EST Ventricular Rate:  58 PR Interval:  290 QRS Duration:  109 QT Interval:  470 QTC Calculation: 462 R Axis:   -23  Text Interpretation: Sinus rhythm Prolonged PR interval Incomplete left bundle branch block Confirmed by Garrick Charleston [phone]) on 07/22/2024 2:54:16 PM  Radiology: CT HEAD WO CONTRAST Result Date: 07/22/2024 CLINICAL DATA:  Neural  deficit. EXAM: CT HEAD WITHOUT CONTRAST TECHNIQUE: Contiguous axial images were obtained from the base of the skull through the vertex without intravenous contrast. RADIATION DOSE REDUCTION: This exam was performed according to the departmental dose-optimization program which includes automated exposure control, adjustment of the mA and/or kV according to patient size and/or use of iterative reconstruction technique. COMPARISON:  Dec 09, 2023 FINDINGS: Brain: There is generalized cerebral atrophy with widening of the extra-axial spaces and ventricular dilatation. There are areas of decreased attenuation within the white matter tracts of the supratentorial brain, consistent with microvascular disease changes. Chronic bilateral basal ganglia lacunar infarcts are seen. A chronic left thalamic lacunar infarct is also noted. Vascular: No hyperdense vessel or unexpected calcification. Skull: Normal. Negative for fracture or focal lesion. Sinuses/Orbits: There  is mild right maxillary sinus mucosal thickening. Other: None. IMPRESSION: 1. Generalized cerebral atrophy with chronic white matter small vessel ischemic changes. 2. Chronic bilateral basal ganglia and left thalamic lacunar infarcts. 3. No acute intracranial abnormality. Electronically Signed   By: Suzen Dials M.D.   On: 07/22/2024 14:19     Procedures   Medications Ordered in the ED - No data to display                                  Medical Decision Making Patient presents with altered mental status, he is nonverbal, in no distress, has a patent airway, but is essentially nonparticipatory.  He has history of prior stroke, withdrawn status at baseline, broad differential including stroke, metabolic derangement, NSTEMI, all considered. Stat head CT ordered after my initial evaluation, facilitated with the assistance radiology did not show acute bleed.  Initial vitals generally reassuring, cardiac 70 sinus normal pulse ox 99% on 2 L nasal cannula  abnormal  Amount and/or Complexity of Data Reviewed Independent Historian: EMS External Data Reviewed: notes. Labs: ordered. Decision-making details documented in ED Course. Radiology: ordered and independent interpretation performed. Decision-making details documented in ED Course. ECG/medicine tests: ordered and independent interpretation performed. Decision-making details documented in ED Course.  Risk Decision regarding hospitalization. Diagnosis or treatment significantly limited by social determinants of health.   Head CT unremarkable, initial labs noncontributory, patient's airway remains patent, he is in no distress, awaiting remaining labs, MRI on signout.    Final diagnoses:  Acute encephalopathy    ED Discharge Orders     None          Garrick Charleston, MD 07/22/24 1612  "

## 2024-07-22 NOTE — ED Notes (Signed)
 [name] Brooking, the patient's wife is requesting an update. [phone]

## 2024-07-22 NOTE — ED Provider Notes (Addendum)
 Was signed out earlier that pt with encephalopathy and bradycardia, and that UA and MRI pending, and when resulted to admit.   MRI neg for cva. Ua neg for uti.   Recheck pt, is awake and alert, confused.  ?whether possibly baclofen  contributed to mental status issues in the setting of aki, advanced age - would recommend holding for now.   Given earlier bradycardic episode with altered mental status and altered  breathing, aki, feel patient requires admission. Hospitalists consulted for admission. Discussed pt - will admit.         Bernard Drivers, MD 07/22/24 [name]    Bernard Drivers, MD 07/22/24 [phone]

## 2024-07-22 NOTE — ED Notes (Signed)
 In and out cath preformed. Second Rn at bedside. Pt tolerated well. Urine specimen obtained and pt cleaned up.

## 2024-07-23 ENCOUNTER — Inpatient Hospital Stay (HOSPITAL_COMMUNITY)

## 2024-07-23 ENCOUNTER — Other Ambulatory Visit: Payer: Self-pay

## 2024-07-23 DIAGNOSIS — G9340 Encephalopathy, unspecified: Secondary | ICD-10-CM | POA: Diagnosis not present

## 2024-07-23 DIAGNOSIS — N179 Acute kidney failure, unspecified: Secondary | ICD-10-CM | POA: Diagnosis not present

## 2024-07-23 DIAGNOSIS — R001 Bradycardia, unspecified: Secondary | ICD-10-CM

## 2024-07-23 DIAGNOSIS — R7989 Other specified abnormal findings of blood chemistry: Secondary | ICD-10-CM

## 2024-07-23 DIAGNOSIS — T481X5A Adverse effect of skeletal muscle relaxants [neuromuscular blocking agents], initial encounter: Secondary | ICD-10-CM

## 2024-07-23 DIAGNOSIS — I480 Paroxysmal atrial fibrillation: Secondary | ICD-10-CM | POA: Diagnosis not present

## 2024-07-23 DIAGNOSIS — I5032 Chronic diastolic (congestive) heart failure: Secondary | ICD-10-CM | POA: Diagnosis not present

## 2024-07-23 DIAGNOSIS — I350 Nonrheumatic aortic (valve) stenosis: Secondary | ICD-10-CM

## 2024-07-23 LAB — BASIC METABOLIC PANEL WITH GFR
Anion gap: 10 (ref 5–15)
BUN: 18 mg/dL (ref 8–23)
CO2: 27 mmol/L (ref 22–32)
Calcium: 9.4 mg/dL (ref 8.9–10.3)
Chloride: 104 mmol/L (ref 98–111)
Creatinine, Ser: 1.05 mg/dL (ref 0.61–1.24)
GFR, Estimated: >60 mL/min
Glucose, Bld: 135 mg/dL — ABNORMAL HIGH (ref 70–99)
Potassium: 4.6 mmol/L (ref 3.5–5.1)
Sodium: 141 mmol/L (ref 135–145)

## 2024-07-23 LAB — TSH: TSH: 1.56 u[IU]/mL (ref 0.350–4.500)

## 2024-07-23 LAB — T4, FREE: Free T4: 1.48 ng/dL (ref 0.80–2.00)

## 2024-07-23 MED ORDER — LATANOPROST 0.005 % OP SOLN
1.0000 [drp] | Freq: Every day | OPHTHALMIC | Status: DC
  Administered 2024-07-23: 1 [drp] via OPHTHALMIC
  Filled 2024-07-23: qty 2.5

## 2024-07-23 MED ORDER — ONDANSETRON HCL 4 MG/2ML IJ SOLN: 4.0000 mg | Freq: Four times a day (QID) | INTRAMUSCULAR | Status: DC | PRN

## 2024-07-23 MED ORDER — BRIMONIDINE TARTRATE 0.15 % OP SOLN
1.0000 [drp] | Freq: Two times a day (BID) | OPHTHALMIC | Status: DC
  Administered 2024-07-23 – 2024-07-24 (×2): 1 [drp] via OPHTHALMIC
  Filled 2024-07-23: qty 5

## 2024-07-23 MED ORDER — ONDANSETRON HCL 4 MG PO TABS: 4.0000 mg | ORAL_TABLET | Freq: Four times a day (QID) | ORAL | Status: DC | PRN

## 2024-07-23 MED ORDER — ACETAMINOPHEN 650 MG RE SUPP: 650.0000 mg | Freq: Four times a day (QID) | RECTAL | Status: DC | PRN

## 2024-07-23 MED ORDER — DORZOLAMIDE HCL-TIMOLOL MAL 2-0.5 % OP SOLN
1.0000 [drp] | Freq: Two times a day (BID) | OPHTHALMIC | Status: DC
  Administered 2024-07-24: 1 [drp] via OPHTHALMIC
  Filled 2024-07-23 (×2): qty 10

## 2024-07-23 MED ORDER — ACETAMINOPHEN 325 MG PO TABS: 650.0000 mg | ORAL_TABLET | Freq: Four times a day (QID) | ORAL | Status: DC | PRN

## 2024-07-23 MED ORDER — POLYETHYLENE GLYCOL 3350 17 G PO PACK: 17.0000 g | PACK | Freq: Every day | ORAL | Status: DC | PRN

## 2024-07-23 MED ORDER — RIVAROXABAN 20 MG PO TABS
20.0000 mg | ORAL_TABLET | Freq: Every evening | ORAL | Status: DC
  Administered 2024-07-23 – 2024-07-24 (×2): 20 mg via ORAL
  Filled 2024-07-23 (×2): qty 1

## 2024-07-23 MED ORDER — ATORVASTATIN CALCIUM 40 MG PO TABS
40.0000 mg | ORAL_TABLET | Freq: Every day | ORAL | Status: DC
  Administered 2024-07-23: 40 mg via ORAL
  Filled 2024-07-23: qty 1

## 2024-07-23 NOTE — Consult Note (Signed)
 "  Cardiology Consultation   Patient ID: Steven Kent MRN: [id number]; DOB: 1934-08-19  Admit date: 07/22/2024 Date of Consult: 07/23/2024  PCP:  [facility] Providers Cardiologist:  Aleene Passe, MD (Inactive)   Patient Profile: Steven Kent is a 89 y.o. 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 Davia.  History of Present Illness: Steven Kent has history of seizure-like activity and with syncopal spells of unknown origins dating back to at least 2021 where he had a loop recorder implanted.  Workup at that time was unremarkable with negative EEGs and brain MRI.  He was previously followed by Dr. Fernande but had documented episodes happened with his eyes closed felt to be noncardiac.  There is question of neurological seizures versus pseudoseizures.  Additionally has a history of atrial fibrillation although I do not see any notes in the detailing this but has been on Xarelto  for this as well as history of recurrent VTE's.  He has been followed by vascular surgery for abdominal aortic aneurysm that has been around 5 cm for years, overall not to be a good candidate for surgery so managing conservatively.  His last echocardiogram was 11/2023 demonstrating preserved biventricular function.  He had mild aortic stenosis at that time.  Currently patient being evaluated for an episode of altered mental status/nonverbal with reported episode of bradycardia with a heart rate of 44 requiring 1 mg of atropine and was bagged on EMS arrival.  Although EMS run strips in the chart did not note any significant bradycardia.  Heart rates have been 50+ while here.  Has known first-degree AV block.  BGL was normal.  Lactic acid normal.  Troponin 53-37.  UA  negative.  BNP 1200+.  Brain MRI without any acute findings.  No clear etiologies to explain his current presentation.  Patient at baseline with encephalopathy/AMS and unable to gain accurate report.  He has no recollection of any of the events.  He is also mixing up days and does not realize he was in the hospital yesterday.  However at this time he does not have any acute complaints denied any shortness of breath, dizziness, passing out, chest pain, peripheral edema, orthopnea.  Attempted to call his wife who did not answer.   Past Medical History:  Diagnosis Date   Abdominal aortic aneurysm (AAA)    Hypertension    Stroke [facility])     Past Surgical History:  Procedure Laterality Date   CATARACT EXTRACTION, BILATERAL     rt eye 10/09/2014 lt eye 11/06/2014   DENTAL SURGERY     REFRACTIVE SURGERY  01/2015    Scheduled Meds:  atorvastatin   40 mg Oral QHS   brimonidine   1 drop Both Eyes BID   dorzolamide -timolol   1 drop Both Eyes BID   latanoprost   1 drop Both Eyes QHS   rivaroxaban   20 mg Oral QPM   Continuous Infusions:  PRN Meds: acetaminophen  **OR** acetaminophen , ondansetron  **OR** ondansetron  (ZOFRAN ) IV, polyethylene glycol  Allergies:   Allergies[1]  Social History:   Social History   Socioeconomic History   Marital status: Married    Spouse name: Not on file   Number of children: Not on file   Years of education: Not on file   Highest education level: Not on file  Occupational History   Occupation: Retired  Tobacco Use   Smoking status: Former   Smokeless tobacco: Never  Substance and Sexual Activity   Alcohol use: Never   Drug use: Never   Sexual activity: Not Currently  Other Topics Concern   Not on file  Social History Narrative   ** Merged History Encounter **'   Was right handed until stoke now uses left hand              Social Drivers of Health   Tobacco Use: Medium Risk (07/22/2024)   Patient History    Smoking Tobacco Use: Former     Smokeless Tobacco Use: Never    Passive Exposure: Not on Actuary Strain: Not on file  Food Insecurity: No Food Insecurity (12/24/2023)   Hunger Vital Sign    Worried About Running Out of Food in the Last Year: Never true    Ran Out of Food in the Last Year: Never true  Transportation Needs: No Transportation Needs (12/24/2023)   PRAPARE - Administrator, Civil Service (Medical): No    Lack of Transportation (Non-Medical): No  Physical Activity: Not on file  Stress: Not on file  Social Connections: Unknown (12/24/2023)   Social Connection and Isolation Panel    Frequency of Communication with Friends and Family: Patient declined    Frequency of Social Gatherings with Friends and Family: Patient declined    Attends Religious Services: Patient declined    Database Administrator or Organizations: Patient declined    Attends Banker Meetings: Patient declined    Marital Status: Married  Catering Manager Violence: Not At Risk (12/24/2023)   Humiliation, Afraid, Rape, and Kick questionnaire    Fear of Current or Ex-Partner: No    Emotionally Abused: No    Physically Abused: No    Sexually Abused: No  Depression (PHQ2-9): Not on file  Alcohol Screen: Not on file  Housing: Low Risk (12/24/2023)   Housing Stability Vital Sign    Unable to Pay for Housing in the Last Year: No    Number of Times Moved in the Last Year: 0    Homeless in the Last Year: No  Utilities: Not At Risk (12/24/2023)   AHC Utilities    Threatened with loss of utilities: No  Health Literacy: Not on file    Family History:   Family History  Problem Relation Age of Onset   Prostate cancer Neg Hx      ROS:  Please see the history of present illness.  All other ROS reviewed and negative.     Physical Exam/Data: Vitals:   07/23/24 0645 07/23/24 0809 07/23/24 0915 07/23/24 0945  BP: (!) 155/85 (!) 179/98 (!) 159/80 (!) 178/85  Pulse:  81 71 73  Resp: 14 17 18 16   Temp:   98.4 F (36.9 C)    TempSrc:  Oral    SpO2:  100% 98% 94%  Weight:      Height:        Intake/Output Summary (Last 24 hours) at 07/23/2024 1136 Last data filed at 07/22/2024 1357 Gross per 24 hour  Intake 250 ml  Output --  Net 250 ml      07/22/2024    2:19 PM 12/24/2023   11:29 AM 12/10/2023   11:29 AM  Last 3 Weights  Weight (lbs) 205 lb 7.5 oz 205 lb 7.5 oz 205 lb 7.5 oz  Weight (kg) 93.2 kg 93.2 kg 93.2  kg     Body mass index is 27.11 kg/m.  General: Confused HEENT: normal Neck: no JVD Vascular: No carotid bruits; Distal pulses 2+ bilaterally Cardiac:  normal S1, S2; RRR; 2 out of 6 murmur at the apex Lungs:  clear to auscultation bilaterally, no wheezing, rhonchi or rales.  Only able to auscultate anteriorly. Abd: soft, nontender, no hepatomegaly  Ext: Dry flaky skin.  No edema  Musculoskeletal:  No deformities, BUE and BLE strength normal and equal Skin: warm and dry  Neuro:  CNs 2-12 intact, no focal abnormalities noted Psych:  Normal affect   EKG:  The EKG was personally reviewed and demonstrates: Sinus rhythm heart rate 58.  First-degree AV block PR interval 290.  Chronic T wave inversions laterally.  1400-abnormal rhythm strip, no real pattern, looks like it begins with NSVT then followed by artifact.  No events noted while this occurred per staff.  Telemetry:  Telemetry was personally reviewed and demonstrates: Sinus, heart rates between 50-80.  PVCs.  Episodes of VT 3-5 beats  Relevant CV Studies: Echocardiogram 12/29/2023  1. Left ventricular ejection fraction, by estimation, is 70 to 75%. The  left ventricle has hyperdynamic function. The left ventricle has no  regional wall motion abnormalities. There is moderate left ventricular  hypertrophy. Left ventricular diastolic  parameters are consistent with Grade II diastolic dysfunction  (pseudonormalization).   2. Right ventricular systolic function is normal. The right ventricular  size is normal.   3.  Left atrial size was mildly dilated.   4. The mitral valve is normal in structure. No evidence of mitral valve  regurgitation. No evidence of mitral stenosis.   5. The aortic valve is normal in structure. There is moderate  calcification of the aortic valve. Aortic valve regurgitation is mild.  Mild aortic valve stenosis. Aortic regurgitation PHT measures 565 msec.  Aortic valve Vmax measures 2.36 m/s.   6. The inferior vena cava is normal in size with greater than 50%  respiratory variability, suggesting right atrial pressure of 3 mmHg.   Laboratory Data: High Sensitivity Troponin:  No results for input(s): TROPONINIHS in the last 720 hours.  Recent Labs  Lab 07/22/24 1408 07/22/24 1608  TRNPT 53* 37*      Chemistry Recent Labs  Lab 07/22/24 1347 07/23/24 0808  NA 139 141  K 4.9 4.6  CL 104 104  CO2 25 27  GLUCOSE 98 135*  BUN 17 18  CREATININE 1.42* 1.05  CALCIUM  9.3 9.4  GFRNONAA 48* >60  ANIONGAP 11 10    Recent Labs  Lab 07/22/24 1347  PROT 7.1  ALBUMIN 3.6  AST 22  ALT 9  ALKPHOS 81  BILITOT 0.9   Lipids No results for input(s): CHOL, TRIG, HDL, LABVLDL, LDLCALC, CHOLHDL in the last 168 hours.  Hematology Recent Labs  Lab 07/22/24 1347  WBC 5.6  RBC 4.72  HGB 13.7  HCT 43.8  MCV 92.8  MCH 29.0  MCHC 31.3  RDW 17.1*  PLT 192   Thyroid   Recent Labs  Lab 07/23/24 0808  TSH 1.560  FREET4 1.48    BNP Recent Labs  Lab 07/22/24 1408  PROBNP 1,259.0*    DDimer No results for input(s): DDIMER in the last 168 hours.  Radiology/Studies:  MR BRAIN WO CONTRAST Result Date: 07/22/2024 EXAM: MRI Brain Without Contrast 07/22/2024 09:50:13 PM TECHNIQUE: Multiplanar multisequence MRI of the head/brain was performed without the administration of intravenous contrast. COMPARISON: MRI Dec 12, 2020. MRI Head Dec 12, 2020.  CLINICAL HISTORY: Neuro deficit, acute, stroke suspected FINDINGS: BRAIN AND VENTRICLES: No acute infarct. No  intracranial hemorrhage. No mass. No midline shift. No hydrocephalus. Advanced T2 hyperintensities in the white matter, compatible with chronic microvascular ischemic change. Remote left thalamic infarct with evidence of prior hemorrhage there. Chronic lacunar infarcts in the basal ganglia and cerebllum. Small foci of sceptibility artifact in the cerebellum, thalami and cortex are compatible with prior microhemorrhages. Normal flow voids. ORBITS: No acute abnormality. 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 SINUSES AND MASTOIDS: No acute abnormality. BONES AND SOFT TISSUES: Normal marrow signal. No acute soft tissue abnormality. IMPRESSION: 1. No acute intracranial abnormality. 2. Advanced chronic microvascular ischemic change, remote left thalamic infarct, and chronic microhemorrhages. 3. 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 Electronically signed by: Gilmore Molt 07/22/2024 10:21 PM EST RP Workstation: [id number]   DG Chest Port 1 View Result Date: 07/22/2024 CLINICAL DATA:  Altered mental status. EXAM: PORTABLE CHEST 1 VIEW COMPARISON:  Dec 24, 2023 FINDINGS: The cardiac silhouette is mildly enlarged and unchanged in size. A loop recorder device is present. There is mild prominence of the central pulmonary vasculature. Low lung volumes are noted with mild, chronic appearing increased lung markings. No focal consolidation, pleural effusion or pneumothorax is identified. Multilevel degenerative changes are seen throughout the thoracic spine. IMPRESSION: Low lung volumes with mild, chronic appearing increased lung markings. Electronically Signed   By: Suzen Dials M.D.   On: 07/22/2024 18:22   CT HEAD WO CONTRAST Result Date: 07/22/2024 CLINICAL DATA:  Neural deficit. EXAM: CT HEAD WITHOUT CONTRAST TECHNIQUE: Contiguous axial images were obtained from the base of the skull through the vertex without intravenous contrast. RADIATION DOSE REDUCTION: This exam was performed according to  the departmental dose-optimization program which includes automated exposure control, adjustment of the mA and/or kV according to patient size and/or use of iterative reconstruction technique. COMPARISON:  Dec 09, 2023 FINDINGS: Brain: There is generalized cerebral atrophy with widening of the extra-axial spaces and ventricular dilatation. There are areas of decreased attenuation within the white matter tracts of the supratentorial brain, consistent with microvascular disease changes. Chronic bilateral basal ganglia lacunar infarcts are seen. A chronic left thalamic lacunar infarct is also noted. Vascular: No hyperdense vessel or unexpected calcification. Skull: Normal. Negative for fracture or focal lesion. Sinuses/Orbits: There is mild right maxillary sinus mucosal thickening. Other: None. IMPRESSION: 1. Generalized cerebral atrophy with chronic white matter small vessel ischemic changes. 2. Chronic bilateral basal ganglia and left thalamic lacunar infarcts. 3. No acute intracranial abnormality. Electronically Signed   By: Suzen Dials M.D.   On: 07/22/2024 14:19     Assessment and Plan:  AMS/history of syncope with ILR Bradycardia Chronic history of recurrent syncope with ILR.  Previously evaluated by EP, not felt to be cardiac in nature with eyes closed during episodes.  Question of neurologic versus pseudoseizures.  Here again with altered mental status/unresponsive episode.  Unclear exact details, patient too confused right now to provide accurate report.  Wife did not answer the phone.  EMS reportedly gave atropine for heart rate of 44 and requiring BVM temporarily.  No clear etiologies, reassuring workup.  Reviewed EKG/telemetry without any significant findings. At times slightly bradycardic with heart rates in the 50s with known first-degree AV block but otherwise no pauses or high-grade A-V conduction disease.  Heart rates appropriately increase and have gone up to 80s. Will interrogate his  loop recorder.  Was not on AV nodal blocking agents prior to admission. Had normal  TSH/T4. Either way questionable candidate for PPM with significant dementia and wheelchair-bound. Will check echocardiogram, murmur appreciated on exam.  History of mild aortic stenosis/aortic regurgitation. At 1400 yesterday, appears to have had brief episode of NSVT but suspect artifact following this.  No events documented during this timeframe so likely not clinically significant.  Will review telemetry with MD though.  PAF Maintaining sinus rhythm here.   Continue with Xarelto  20 mg.  Avoid beta-blockers.  Chronic HFpEF Overall looks to be euvolemic.  Torsemide  on hold.  Getting echocardiogram as above.  AAA Followed by vascular surgery.  Has been around 5 cm for years.  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  Not felt to be a candidate for surgical repair.  History of recurrent VTE/CVA On Xarelto  and atorvastatin  40 mg.    Risk Assessment/Risk Scores:  CHA2DS2-VASc Score = 6  This indicates a 9.7% annual risk of stroke. The patient's score is based upon: CHF History: 0 HTN History: 1 Diabetes History: 0 Stroke History: 2 Vascular Disease History: 1 Age Score: 2 Gender Score: 0    For questions or updates, please contact [facility] Please consult [email or web address] for contact info under      Signed, Thom LITTIE Sluder, PA-C  07/23/2024 11:36 AM      [1]  Allergies Allergen Reactions   Aspirin Other (See Comments)    Full Strength Asprin-325 mg causes upset stomach. Can take 81 mg.    "

## 2024-07-23 NOTE — ED Notes (Signed)
 Wife, [name] [phone], is requesting an update asap

## 2024-07-23 NOTE — H&P (Addendum)
 " History and Physical  Patient: Steven Kent FMW:[id number] DOB: [birth year] DOA: 07/22/2024 DOS: the patient was seen and examined on 07/23/2024 Patient coming from: SNF, [facility]  Chief Complaint:  Chief Complaint  Patient presents with   Altered Mental Status   HPI: Steven Kent is a 89 y.o. male with PMH significant of HTN, AAA, prior CVA with right-sided hemiparesis, CAD, paroxysmal A-fib on Xarelto , dementia was brought from SNF via EMS for altered mental status ?unresponsive episode.   On EMS arrival, patient was noted to have bradycardia with heart rate 44, respiratory rate 8, received 1 mg atropine, was bagged, sats 100, BP 150/78, CBG 1 1. By report, staff at the facility felt patient had altered mental status, unclear if patient had a syncopal episode. At the time of encounter, patient is alert and oriented x3 but does not know why he is in the hospital, does not remember any prodromal symptoms.  Has no chest pain, shortness of breath, fever chills nausea or vomiting.  Heart rate currently 85, BP 172/98, RR 13, not on any O2  ED course:  Labs from 12/22 showed BUN 17, creatinine 1.42 otherwise unremarkable.  Baseline creatinine 0.9-1.0 BNP 1259, troponin 53-> 37, lactic acid 1.8.  CBC unremarkable.  EKG showed sinus rhythm, rate 58, QTc 462, prolonged PR interval, incomplete LBBB UA negative for UTI MRI brain showed no acute intracranial abnormality, advanced chronic microvascular ischemic change, remote left thalamic infarct and chronic microvascular hemorrhages.  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 All other systems reviewed and are negative. Past Medical History:  Diagnosis Date   Abdominal aortic aneurysm (AAA)    Hypertension    Stroke [facility])    Past Surgical History:  Procedure Laterality Date   CATARACT EXTRACTION, BILATERAL     rt eye 10/09/2014 lt eye 11/06/2014   DENTAL  SURGERY     REFRACTIVE SURGERY  01/2015   Social History:  reports that he has quit smoking. He has never used smokeless tobacco. He reports that he does not drink alcohol and does not use drugs. Allergies[1] Family History  Problem Relation Age of Onset   Prostate cancer Neg Hx    Prior to Admission medications  Medication Sig Start Date End Date Taking? Authorizing Provider  acetaminophen  (TYLENOL ) 500 MG tablet Take 1,000 mg by mouth 2 (two) times daily.   Yes [provider]  atorvastatin  (LIPITOR) 40 MG tablet TAKE 1 TABLET(40 MG) BY MOUTH EVERY EVENING Patient taking differently: Take 40 mg by mouth at bedtime. 09/07/20  Yes Kennyth Worth HERO, MD  baclofen  (LIORESAL ) 10 MG tablet Take 1 tablet (10 mg total) by mouth 2 (two) times daily. 12/12/23  Yes Gherghe, Costin M, MD  barrier cream (NON-SPECIFIED) CREA Apply 1 Application topically See admin instructions. Apply topically every shift to maintain skin integrity. Incontinence care.   Yes [provider]  brimonidine  (ALPHAGAN ) 0.15 % ophthalmic solution Place 1 drop into both eyes 2 (two) times daily.   Yes [provider]  chlorhexidine (PERIDEX) 0.12 % solution 10 mLs by Mouth Rinse route every Monday, Wednesday, and Friday. Rinse and spit.   Yes [provider]  donepezil  (ARICEPT ) 10 MG tablet Take 10 mg by mouth at bedtime.   Yes [provider]  dorzolamide -timolol  (COSOPT ) 2-0.5 % ophthalmic solution Place 1 drop into both eyes 2 (two) times daily.   Yes [provider]  gabapentin  (NEURONTIN ) 300  MG capsule Take 2 capsules (600 mg total) by mouth 3 (three) times daily. 12/29/23  Yes Perri DELENA Meliton Mickey., MD  latanoprost  (XALATAN ) 0.005 % ophthalmic solution Place 1 drop into both eyes at bedtime.   Yes [provider]  melatonin 5 MG TABS Take 5 mg by mouth at bedtime.   Yes [provider]  Menthol, Topical Analgesic, 5 % GEL Apply 1 application  topically  every 12 (twelve) hours as needed (pain).   Yes [provider]  mineral oil-hydrophilic petrolatum (AQUAPHOR) ointment Apply 1 Application topically See admin instructions. Apply topically to legs and feet every other day, may also apply daily as needed for dry skin.   Yes [provider]  oxybutynin  (DITROPAN -XL) 10 MG 24 hr tablet Take 10 mg by mouth daily.   Yes [provider]  potassium chloride  SA (KLOR-CON  M) 20 MEQ tablet Take 20 mEq by mouth every other day.   Yes [provider]  SODIUM FLUORIDE 5000 PPM 1.1 % PSTE Use as directed 1 application  in the mouth or throat every evening. 05/21/24  Yes [provider]  torsemide  (DEMADEX ) 20 MG tablet Take 1 tablet (20 mg total) by mouth daily as needed. Patient taking differently: Take 20 mg by mouth daily as needed (weight gain >3 lbs in a day or 5 lbs in a week). 12/12/23  Yes Gherghe, Costin M, MD  Vitamin D , Ergocalciferol , (DRISDOL ) 1.25 MG (50000 UNIT) CAPS capsule TAKE 1 CAPSULE BY MOUTH EVERY 7 DAYS Patient taking differently: Take 50,000 Units by mouth every 7 (seven) days. Sunday 12/21/20  Yes Kennyth Worth HERO, MD  XARELTO  20 MG TABS tablet TAKE 1 TABLET(20 MG) BY MOUTH EVERY EVENING Patient taking differently: Take 20 mg by mouth every evening. 09/07/20  Yes Kennyth Worth HERO, MD  Amino Acids-Protein Hydrolys (PRO-STAT) LIQD Take 30 mLs by mouth daily.    [provider]   Physical Exam: Vitals:   07/23/24 0600 07/23/24 0615 07/23/24 0630 07/23/24 0645  BP: (!) 170/91 (!) 158/82 (!) 161/86 (!) 155/85  Pulse:      Resp: 14 18 11 14   Temp:      TempSrc:      SpO2:      Weight:      Height:         General: Alert, awake, oriented x3, NAD Eyes: pink conjunctiva, anicteric sclera, PERLA HEENT: normocephalic, atraumatic, oropharynx clear Neck: supple, no masses or lymphadenopathy, no JVD CVS: Regular rate and rhythm, no murmurs, rubs or gallops. Resp : Clear to auscultation  bilaterally, no wheezing, rales or rhonchi. GI : Soft, nontender, nondistended, positive bowel sounds. No hepatomegaly.  Ext: No lower extremity edema, skin dryness and excoriation lower extremities Musculoskeletal: No clubbing or cyanosis, positive pedal pulses. No contracture. ROM intact  Neuro: Grossly intact, no new FND's, right-sided hemiparesis from prior stroke Psych: alert and oriented x 3, normal mood and affect Skin: no rashes or lesions, warm and dry   Data Reviewed: I have reviewed ED notes, Vitals, Lab results and outpatient records.   Recent Labs  Lab 07/22/24 1347  NA 139  K 4.9  CL 104  CO2 25  GLUCOSE 98  BUN 17  CREATININE 1.42*  CALCIUM  9.3   Recent Labs  Lab 07/22/24 1347  WBC 5.6  NEUTROABS 2.9  HGB 13.7  HCT 43.8  MCV 92.8  PLT 192    Assessment and Plan Principal Problem:   Acute metabolic encephalopathy - Unclear  etiology, possibly from AKI, bradycardia, medications including baclofen , Neurontin .  No UTI, MRI negative for acute CVA - Currently alert and oriented, hold Aricept , baclofen , gabapentin , melatonin   Active Problems: Bradycardia with history of paroxysmal A-fib on Xarelto  - Not on any rate controlling meds - Mildly elevated troponins, obtain 2D echo - Obtain TSH, free T4, - Hold baclofen , Aricept , gabapentin  - EKG with first-degree AV block, similar to prior EKG.  - Currently heart rate improved, cardiology consulted    Hemiparesis affecting right side as late effect of cerebrovascular accident (HCC) -Continue Xarelto , no acute FND's.  - MRI negative for acute CVA     Essential hypertension -BP slightly elevated, will place on IV hydralazine  as needed with parameters  Dementia with peripheral neuropathy -Hold Aricept  due to bradycardia, - hold baclofen , gabapentin  due to altered mental status on arrival     Hyperlipidemia -Continue statin    Coronary artery disease involving native heart without angina  pectoris Chronic diastolic CHF -Prior echo 5/25 had shown EF 77 5%, G2 DD - BNP 1259, troponin mildly elevated 53-37, no acute chest pain or shortness of breath, no peripheral edema - On torsemide  as needed, creatinine 1.4 - Obtain BMET, hold torsemide      Advance Care Planning:   Code Status: Full Code  Consults: Cardiology Family Communication: No family member at the bedside Severity of Illness:      The appropriate patient status for this patient is INPATIENT. Inpatient status is judged to be reasonable and necessary in order to provide the required intensity of service to ensure the patient's safety. The patient's presenting symptoms, physical exam findings, and initial radiographic and laboratory data in the context of their chronic comorbidities is felt to place them at high risk for further clinical deterioration. Furthermore, it is not anticipated that the patient will be medically stable for discharge from the hospital within 2 midnights of admission.   * I certify that at the point of admission it is my clinical judgment that the patient will require inpatient hospital care spanning beyond 2 midnights from the point of admission due to high intensity of service, high risk for further deterioration and high frequency of surveillance required.*    Author: Nydia Distance, MD 07/23/2024 7:49 AM For on call review [email or web address].       [1]  Allergies Allergen Reactions   Aspirin Other (See Comments)    Full Strength Asprin-325 mg causes upset stomach. Can take 81 mg.    "

## 2024-07-23 NOTE — Progress Notes (Signed)
" ° ° °  EXPEDITER LEVEL LOADING ASSESSMENT NOTE  Patient Name: Steven Kent  DOB:[birth year] Date of Admission: 07/22/2024  Date of Assessment:07/23/2024   -------------------------------------------------------------------------------------------------------------------   Brief clinical summary:  89 y.o. male with PMH significant of HTN, AAA, prior CVA with right-sided hemiparesis, CAD, paroxysmal A-fib on Xarelto , dementia was brought from SNF via EMS for altered mental status ?unresponsive episode.    Is there Bed Availability at another [facility]? Yes  If yes, what facility: [facility]  Level of Care Needed:  No  MD Agree to transfer:   Patient agrees to transfer:     -------------------------------------------------------------------------------------------------------------------  [facility] RN Expediter, Sharolyn JONETTA Batman Please contact us  directly via secure chat (search for [facility]) or by calling us  at [phone] [facility]). "

## 2024-07-23 NOTE — ED Notes (Signed)
 Pt in bed, pt awake, pt oriented to person, place and date, pt however doesn't know is age and is sometimes confused about location, re oriented pt. 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  Pt denies pain, pt has no requests

## 2024-07-23 NOTE — ED Notes (Signed)
 Pt brief and pad changed prior to transport [facility]

## 2024-07-24 ENCOUNTER — Telehealth: Payer: Self-pay

## 2024-07-24 DIAGNOSIS — G9340 Encephalopathy, unspecified: Secondary | ICD-10-CM | POA: Diagnosis not present

## 2024-07-24 DIAGNOSIS — R001 Bradycardia, unspecified: Secondary | ICD-10-CM | POA: Diagnosis not present

## 2024-07-24 DIAGNOSIS — I350 Nonrheumatic aortic (valve) stenosis: Secondary | ICD-10-CM | POA: Diagnosis not present

## 2024-07-24 DIAGNOSIS — I480 Paroxysmal atrial fibrillation: Secondary | ICD-10-CM | POA: Diagnosis not present

## 2024-07-24 LAB — CBC
HCT: 45.3 % (ref 39.0–52.0)
Hemoglobin: 14.8 g/dL (ref 13.0–17.0)
MCH: 29.1 pg (ref 26.0–34.0)
MCHC: 32.7 g/dL (ref 30.0–36.0)
MCV: 89.2 fL (ref 80.0–100.0)
Platelets: 187 K/uL (ref 150–400)
RBC: 5.08 MIL/uL (ref 4.22–5.81)
RDW: 17 % — ABNORMAL HIGH (ref 11.5–15.5)
WBC: 5.6 K/uL (ref 4.0–10.5)
nRBC: 0 % (ref 0.0–0.2)

## 2024-07-24 LAB — BASIC METABOLIC PANEL WITH GFR
Anion gap: 13 (ref 5–15)
BUN: 17 mg/dL (ref 8–23)
CO2: 23 mmol/L (ref 22–32)
Calcium: 9 mg/dL (ref 8.9–10.3)
Chloride: 102 mmol/L (ref 98–111)
Creatinine, Ser: 1.08 mg/dL (ref 0.61–1.24)
GFR, Estimated: >60 mL/min
Glucose, Bld: 95 mg/dL (ref 70–99)
Potassium: 3.8 mmol/L (ref 3.5–5.1)
Sodium: 138 mmol/L (ref 135–145)

## 2024-07-24 LAB — ECHOCARDIOGRAM COMPLETE
AR max vel: 1.35 cm2
AV Area VTI: 1.21 cm2
AV Area mean vel: 1.25 cm2
AV Mean grad: 19 mmHg
AV Peak grad: 34.3 mmHg
Ao pk vel: 2.93 m/s
Area-P 1/2: 5.27 cm2
Calc EF: 50.7 %
Height: 73 in
S' Lateral: 2.9 cm
Single Plane A2C EF: 40.4 %
Single Plane A4C EF: 55.3 %
Weight: 3287.5 [oz_av]

## 2024-07-24 NOTE — TOC Transition Note (Signed)
 Transition of Care [facility]) - Discharge Note   Patient Details  Name: Steven Kent MRN: [id number] Date of Birth: 06-03-1935  [facility] CM/SW Contact:  [name] [name] Phone Number: 07/24/2024, 12:58 PM   Clinical Narrative:    Patient will DC to: [facility] Anticipated DC date: 07/24/24 Family notified: Yes Transport by: [facility]   Per MD patient ready for DC to [facility]. RN to call report prior to discharge [phone] . RN, patient, patient's family, and facility notified of DC. Discharge Summary and FL2 sent to facility. DC packet on chart. Ambulance transport requested for patient.   CSW will sign off for now as social work intervention is no longer needed. Please consult us  again if new needs arise.     Final next level of care: Assisted Living Barriers to Discharge: Barriers Resolved   Patient Goals and CMS Choice Patient states their goals for this hospitalization and ongoing recovery are:: return to [facility]          Discharge Placement              Patient [facility] bed at: [facility] Patient to be transferred to facility by: [facility] Name of family member notified: [name] Patient and family notified of of transfer: 07/24/24  Discharge Plan and Services Additional resources added to the After Visit Summary for                                       Social Drivers of Health (SDOH) Interventions SDOH Screenings   Food Insecurity: No Food Insecurity (07/23/2024)  Housing: Low Risk (07/23/2024)  Transportation Needs: No Transportation Needs (07/23/2024)  Utilities: Not At Risk (07/23/2024)  Social Connections: Moderately Isolated (07/23/2024)  Tobacco Use: Medium Risk (07/22/2024)     Readmission Risk Interventions    08/04/2022   11:18 AM  Readmission Risk Prevention Plan  Post Dischage Appt Complete  Medication Screening Complete  Transportation Screening Complete

## 2024-07-24 NOTE — Progress Notes (Signed)
 "   Subjective:  Denies SSCP, palpitations or Dyspnea Eating breakfast  I would rather be in heaven than here    Objective:  Vitals:   07/23/24 1936 07/23/24 2326 07/24/24 0339 07/24/24 0828  BP: (!) 170/99 (!) 178/99 (!) 181/89 (!) 185/102  Pulse: 76 67 80 80  Resp: 20 17 20 20   Temp: 98.5 F (36.9 C) 98.7 F (37.1 C) 99 F (37.2 C) 99.2 F (37.3 C)  TempSrc: Oral Oral Oral Axillary  SpO2: 97%     Weight:      Height:        Intake/Output from previous day: No intake or output data in the 24 hours ending 07/24/24 0940  Physical Exam:  Chronically ill black male  Lungs clear AS murmur 3/6 SEM  Abdomen benign LE;s dry with chronic skin changes plus one edema  Lab Results: Basic Metabolic Panel: Recent Labs    07/22/24 1347 07/23/24 0808  NA 139 141  K 4.9 4.6  CL 104 104  CO2 25 27  GLUCOSE 98 135*  BUN 17 18  CREATININE 1.42* 1.05  CALCIUM  9.3 9.4   Liver Function Tests: Recent Labs    07/22/24 1347  AST 22  ALT 9  ALKPHOS 81  BILITOT 0.9  PROT 7.1  ALBUMIN 3.6   No results for input(s): LIPASE, AMYLASE in the last 72 hours. CBC: Recent Labs    07/22/24 1347 07/24/24 0846  WBC 5.6 5.6  NEUTROABS 2.9  --   HGB 13.7 14.8  HCT 43.8 45.3  MCV 92.8 89.2  PLT 192 187    Thyroid  Function Tests: Recent Labs    07/23/24 0808  TSH 1.560    Imaging: ECHOCARDIOGRAM COMPLETE Result Date: 07/24/2024    ECHOCARDIOGRAM REPORT   Patient Name:   Steven Kent Date of Exam: 07/23/2024 Medical Rec #:  [id number]          Height:       73.0 in Accession #:    [id number]         Weight:       205.5 lb Date of Birth:  1935-03-14         BSA:          2.176 m Patient Age:    88 years           BP:           170/98 mmHg Patient Gender: M                  HR:           82 bpm. Exam Location:  Inpatient Procedure: 2D Echo, Cardiac Doppler and Color Doppler (Both Spectral and Color            Flow Doppler were utilized during procedure). Indications:     Elevated Troponin  History:        Patient has prior history of Echocardiogram examinations, most                 recent 12/29/2023. Risk Factors:Hypertension.  Sonographer:    Nathanel Devonshire Referring Phys: NYDIA POUR RAI IMPRESSIONS  1. Left ventricular ejection fraction, by estimation, is 65 to 70%. The left ventricle has normal function. The left ventricle has no regional wall motion abnormalities. There is moderate concentric left ventricular hypertrophy. Left ventricular diastolic parameters are consistent with Grade I diastolic dysfunction (impaired relaxation).  2. Right ventricular systolic function is normal. The right ventricular size is  normal. Tricuspid regurgitation signal is inadequate for assessing PA pressure.  3. Left atrial size was mildly dilated.  4. The mitral valve is degenerative. No evidence of mitral valve regurgitation. No evidence of mitral stenosis.  5. The aortic valve is tricuspid. There is severe calcifcation of the aortic valve. Aortic valve regurgitation is mild. Moderate aortic valve stenosis. Aortic valve area, by VTI measures 1.21 cm. Aortic valve mean gradient measures 19.0 mmHg. Aortic valve Vmax measures 2.93 m/s.  6. Aortic dilatation noted. There is mild dilatation of the aortic root, measuring 41 mm. FINDINGS  Left Ventricle: Left ventricular ejection fraction, by estimation, is 65 to 70%. The left ventricle has normal function. The left ventricle has no regional wall motion abnormalities. The left ventricular internal cavity size was normal in size. There is  moderate concentric left ventricular hypertrophy. Left ventricular diastolic parameters are consistent with Grade I diastolic dysfunction (impaired relaxation). Right Ventricle: The right ventricular size is normal. No increase in right ventricular wall thickness. Right ventricular systolic function is normal. Tricuspid regurgitation signal is inadequate for assessing PA pressure. Left Atrium: Left atrial size was  mildly dilated. Right Atrium: Right atrial size was normal in size. Pericardium: There is no evidence of pericardial effusion. Mitral Valve: The mitral valve is degenerative in appearance. Mild mitral annular calcification. No evidence of mitral valve regurgitation. No evidence of mitral valve stenosis. Tricuspid Valve: The tricuspid valve is normal in structure. Tricuspid valve regurgitation is not demonstrated. No evidence of tricuspid stenosis. Aortic Valve: The aortic valve is tricuspid. There is severe calcifcation of the aortic valve. Aortic valve regurgitation is mild. Moderate aortic stenosis is present. Aortic valve mean gradient measures 19.0 mmHg. Aortic valve peak gradient measures 34.3 mmHg. Aortic valve area, by VTI measures 1.21 cm. Pulmonic Valve: The pulmonic valve was normal in structure. Pulmonic valve regurgitation is mild. No evidence of pulmonic stenosis. Aorta: Aortic dilatation noted. There is mild dilatation of the aortic root, measuring 41 mm. Venous: The inferior vena cava was not well visualized. IAS/Shunts: No atrial level shunt detected by color flow Doppler.  LEFT VENTRICLE PLAX 2D LVIDd:         4.20 cm     Diastology LVIDs:         2.90 cm     LV e' medial:    3.15 cm/s LV PW:         1.40 cm     LV E/e' medial:  15.5 LV IVS:        1.40 cm     LV e' lateral:   6.74 cm/s LVOT diam:     2.00 cm     LV E/e' lateral: 7.3 LV SV:         70 LV SV Index:   32 LVOT Area:     3.14 cm LV IVRT:       121 msec  LV Volumes (MOD) LV vol d, MOD A2C: 70.8 ml LV vol d, MOD A4C: 66.4 ml LV vol s, MOD A2C: 42.2 ml LV vol s, MOD A4C: 29.7 ml LV SV MOD A2C:     28.6 ml LV SV MOD A4C:     66.4 ml LV SV MOD BP:      36.0 ml RIGHT VENTRICLE RV Basal diam:  2.80 cm TAPSE (M-mode): 1.6 cm LEFT ATRIUM             Index        RIGHT ATRIUM  Index LA diam:        3.60 cm 1.65 cm/m   RA Area:     12.10 cm LA Vol (A2C):   59.4 ml 27.29 ml/m  RA Volume:   24.20 ml  11.12 ml/m LA Vol (A4C):   49.6 ml  22.79 ml/m LA Biplane Vol: 55.6 ml 25.55 ml/m  AORTIC VALVE AV Area (Vmax):    1.35 cm AV Area (Vmean):   1.25 cm AV Area (VTI):     1.21 cm AV Vmax:           293.00 cm/s AV Vmean:          204.000 cm/s AV VTI:            0.575 m AV Peak Grad:      34.3 mmHg AV Mean Grad:      19.0 mmHg LVOT Vmax:         126.00 cm/s LVOT Vmean:        81.200 cm/s LVOT VTI:          0.222 m LVOT/AV VTI ratio: 0.39  AORTA Ao Root diam: 4.10 cm Ao Asc diam:  3.40 cm MITRAL VALVE MV Area (PHT): 5.27 cm     SHUNTS MV Decel Time: 144 msec     Systemic VTI:  0.22 m MV E velocity: 48.95 cm/s   Systemic Diam: 2.00 cm MV A velocity: 122.00 cm/s MV E/A ratio:  0.40 Toribio Fuel MD Electronically signed by Toribio Fuel MD Signature Date/Time: 07/24/2024/1:58:08 AM    Final    MR BRAIN WO CONTRAST Result Date: 07/22/2024 EXAM: MRI Brain Without Contrast 07/22/2024 09:50:13 PM TECHNIQUE: Multiplanar multisequence MRI of the head/brain was performed without the administration of intravenous contrast. COMPARISON: MRI Dec 12, 2020. MRI Head Dec 12, 2020. CLINICAL HISTORY: Neuro deficit, acute, stroke suspected FINDINGS: BRAIN AND VENTRICLES: No acute infarct. No intracranial hemorrhage. No mass. No midline shift. No hydrocephalus. Advanced T2 hyperintensities in the white matter, compatible with chronic microvascular ischemic change. Remote left thalamic infarct with evidence of prior hemorrhage there. Chronic lacunar infarcts in the basal ganglia and cerebllum. Small foci of sceptibility artifact in the cerebellum, thalami and cortex are compatible with prior microhemorrhages. Normal flow voids. ORBITS: No acute abnormality. 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 SINUSES AND MASTOIDS: No acute abnormality. BONES AND SOFT TISSUES: Normal marrow signal. No acute soft tissue abnormality. IMPRESSION: 1. No acute intracranial abnormality. 2. Advanced chronic microvascular ischemic change, remote left thalamic infarct, and  chronic microhemorrhages. 3. 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 Electronically signed by: Gilmore Molt 07/22/2024 10:21 PM EST RP Workstation: [id number]   DG Chest Port 1 View Result Date: 07/22/2024 CLINICAL DATA:  Altered mental status. EXAM: PORTABLE CHEST 1 VIEW COMPARISON:  Dec 24, 2023 FINDINGS: The cardiac silhouette is mildly enlarged and unchanged in size. A loop recorder device is present. There is mild prominence of the central pulmonary vasculature. Low lung volumes are noted with mild, chronic appearing increased lung markings. No focal consolidation, pleural effusion or pneumothorax is identified. Multilevel degenerative changes are seen throughout the thoracic spine. IMPRESSION: Low lung volumes with mild, chronic appearing increased lung markings. Electronically Signed   By: Suzen Dials M.D.   On: 07/22/2024 18:22   CT HEAD WO CONTRAST Result Date: 07/22/2024 CLINICAL DATA:  Neural deficit. EXAM: CT HEAD WITHOUT CONTRAST TECHNIQUE: Contiguous axial images were obtained from the base of the skull through the vertex without intravenous contrast. RADIATION DOSE REDUCTION: This exam  was performed according to the departmental dose-optimization program which includes automated exposure control, adjustment of the mA and/or kV according to patient size and/or use of iterative reconstruction technique. COMPARISON:  Dec 09, 2023 FINDINGS: Brain: There is generalized cerebral atrophy with widening of the extra-axial spaces and ventricular dilatation. There are areas of decreased attenuation within the white matter tracts of the supratentorial brain, consistent with microvascular disease changes. Chronic bilateral basal ganglia lacunar infarcts are seen. A chronic left thalamic lacunar infarct is also noted. Vascular: No hyperdense vessel or unexpected calcification. Skull: Normal. Negative for fracture or focal lesion. Sinuses/Orbits: There is mild right maxillary sinus mucosal  thickening. Other: None. IMPRESSION: 1. Generalized cerebral atrophy with chronic white matter small vessel ischemic changes. 2. Chronic bilateral basal ganglia and left thalamic lacunar infarcts. 3. No acute intracranial abnormality. Electronically Signed   By: Suzen Dials M.D.   On: 07/22/2024 14:19    Cardiac Studies:  ECG: SR rate 58 first degree AV block Latera T wave changes    Telemetry:NSR  Echo:    IMPRESSIONS    1. Left ventricular ejection fraction, by estimation, is 65 to 70%. The left ventricle has normal function. The left ventricle has no regional wall motion abnormalities. There is moderate concentric left ventricular hypertrophy. Left ventricular diastolic parameters are consistent with Grade I diastolic dysfunction (impaired relaxation).  2. Right ventricular systolic function is normal. The right ventricular size is normal. Tricuspid regurgitation signal is inadequate for assessing PA pressure.  3. Left atrial size was mildly dilated.  4. The mitral valve is degenerative. No evidence of mitral valve regurgitation. No evidence of mitral stenosis.  5. The aortic valve is tricuspid. There is severe calcifcation of the aortic valve. Aortic valve regurgitation is mild. Moderate aortic valve stenosis. Aortic valve area, by VTI measures 1.21 cm. Aortic valve mean gradient measures 19.0 mmHg. Aortic valve Vmax measures 2.93 m/s.  6. Aortic dilatation noted. There is mild dilatation of the aortic root, measuring 41 mm.    Medications:    atorvastatin   40 mg Oral QHS   brimonidine   1 drop Both Eyes BID   dorzolamide -timolol   1 drop Both Eyes BID   latanoprost   1 drop Both Eyes QHS   rivaroxaban   20 mg Oral QPM      Assessment/Plan:   AMS/history of syncope with ILR Bradycardia Chronic history of recurrent syncope with ILR.  Previously evaluated by EP, not felt to be cardiac in nature with eyes closed during episodes.  Question of neurologic versus  pseudoseizures.  Here again with altered mental status/unresponsive episode.  Unclear exact details, patient too confused right now to provide accurate report.  Wife did not answer the phone.  EMS reportedly gave atropine for heart rate of 44 and requiring BVM temporarily.  No clear etiologies, reassuring workup.  Reviewed EKG/telemetry without any significant findings. At times slightly bradycardic with heart rates in the 50s with known first-degree AV block but otherwise no pauses or high-grade A-V conduction disease.  Heart rates appropriately increase and have gone up to 80s. ILR with no bradycardia/arrhythmia to explain events  Was not on AV nodal blocking agents prior to admission. Had normal TSH/T4. Either way questionable candidate for PPM with significant dementia and wheelchair-bound.SABRA   PAF Maintaining sinus rhythm here.   Continue with Xarelto  20 mg.  Avoid beta-blockers.   Chronic HFpEF Overall looks to be euvolemic.  Torsemide  on hold.  Getting echocardiogram as above.   AAA Followed by vascular surgery.  Has been around 5 cm for years.  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  Not felt to be a candidate for surgical repair.  Aortic Stenosis: Moderate by echo this admission  Mean gradient 19, peak 34 mmHg AVA 1.3 cm2 No need for intervention at this time    History of recurrent VTE/CVA On Xarelto  and atorvastatin  40 mg.       Risk Assessment/Risk Scores:   CHA2DS2-VASc Score = 6  This indicates a 9.7% annual risk of stroke. The patient's score is based upon: CHF History: 0 HTN History: 1 Diabetes History: 0 Stroke History: 2 Vascular Disease History: 1 Age Score: 2 Gender Score: 0 Maude Emmer 07/24/2024, 9:40 AM  Cardiology will sign off  Primary issues now social / placement and med non compliance  Maude Emmer MD [facility]    "

## 2024-07-24 NOTE — Progress Notes (Signed)
 [facility] said patient was soiled and they did not have time to wait for him to be cleaned up. They have 27 other patients to pick up. He was soiled and fussing. Someone will come back later. Ann Nena Hoard, RN

## 2024-07-24 NOTE — Care Management Obs Status (Signed)
 MEDICARE OBSERVATION STATUS NOTIFICATION   Patient Details  Name: Steven Kent MRN: [id number] Date of Birth: 06/03/1935   Medicare Observation Status Notification Given:  Yes    Corean [name], RN 07/24/2024, 11:39 AM

## 2024-07-24 NOTE — Telephone Encounter (Addendum)
 Alert received from [employer] for 1 brady event x 8 sec, V-rate 45 bpm.  Brief daytime SB.  First brady.  Patient was admitted during event. Continue to monitor.

## 2024-07-24 NOTE — Care Management CC44 (Signed)
"         Condition Code 44 Documentation Completed  Patient Details  Name: Steven Kent MRN: [id number] Date of Birth: [birth year]   Condition Code 44 given:  Yes Patient signature on Condition Code 44 notice:  Yes Documentation of 2 MD's agreement:  Yes Code 44 added to claim:  Yes    Corean [name], RN 07/24/2024, 11:39 AM  "

## 2024-07-24 NOTE — Discharge Summary (Signed)
 Physician Discharge Summary  Steven Kent FMW:[id number] DOB: 1934-08-05 DOA: 07/22/2024  PCP: Leonidas  Admit date: 07/22/2024 Discharge date: 07/24/2024  Admitted From: SNF Disposition: SNF  Recommendations for Outpatient Follow-up:  Follow up with PCP in 1-2 weeks Follow-up with cardiology as scheduled  Discharge Condition: Stable CODE STATUS: Full Diet recommendation: Low-salt low-fat low-carb diet  Brief/Interim Summary: Steven Kent is a 89 y.o. male with PMH significant of HTN, AAA, prior CVA with right-sided hemiparesis, CAD, paroxysmal A-fib on Xarelto , dementia was brought from SNF via EMS for altered mental status/unspecified unresponsive episode.  Patient reported to have bradycardia to the 40s respiratory rate of 8 receiving atropine at that time, patient admitted to hospital for further evaluation and workup.  Cardiology consulted.  Patient has chronic syncopal episodes, prior loop recorder placed for this reason, loop recorder interrogated with no notable bradycardia or dysrhythmia, he is not on any AV nodal blocking agents with normal thyroid  function.  He is at this time awake alert oriented and back to baseline, otherwise stable and agreeable for discharge back to facility, he is unfortunately not a candidate for pacemaker given age and comorbid conditions now wheelchair-bound given elevated risk of complications.  Medication changes as below with discontinuation of Aricept  otherwise continue routine follow-up outpatient with cardiology and vascular surgery given his notable vascular disease but is not a surgical candidate for his aortic aneurysm repair or aortic stenosis repair.  Discharge Diagnoses:  Principal Problem:   Acute encephalopathy Active Problems:   Hemiparesis affecting right side as late effect of cerebrovascular accident [facility])   Essential hypertension   Peripheral neuropathy   Hyperlipidemia   Coronary artery disease involving native  heart without angina pectoris   Bradycardia   History of stroke   AKI (acute kidney injury)   Elevated troponin    Discharge Instructions  Discharge Instructions     Call MD for:  difficulty breathing, headache or visual disturbances   Complete by: As directed    Call MD for:  extreme fatigue   Complete by: As directed    Call MD for:  hives   Complete by: As directed    Call MD for:  persistant dizziness or light-headedness   Complete by: As directed    Call MD for:  persistant nausea and vomiting   Complete by: As directed    Call MD for:  severe uncontrolled pain   Complete by: As directed    Call MD for:  temperature >100.4   Complete by: As directed    Increase activity slowly   Complete by: As directed       Allergies as of 07/24/2024       Reactions   Aspirin Other (See Comments)   Full Strength Asprin-325 mg causes upset stomach. Can take 81 mg.        Medication List     STOP taking these medications    donepezil  10 MG tablet Commonly known as: ARICEPT        TAKE these medications    acetaminophen  500 MG tablet Commonly known as: TYLENOL  Take 1,000 mg by mouth 2 (two) times daily.   atorvastatin  40 MG tablet Commonly known as: LIPITOR TAKE 1 TABLET(40 MG) BY MOUTH EVERY EVENING What changed: See the new instructions.   baclofen  10 MG tablet Commonly known as: LIORESAL  Take 1 tablet (10 mg total) by mouth 2 (two) times daily.   brimonidine  0.15 % ophthalmic solution Commonly known as: ALPHAGAN  Place 1 drop into both  eyes 2 (two) times daily.   chlorhexidine 0.12 % solution Commonly known as: PERIDEX 10 mLs by Mouth Rinse route every Monday, Wednesday, and Friday. Rinse and spit.   dorzolamide -timolol  2-0.5 % ophthalmic solution Commonly known as: COSOPT  Place 1 drop into both eyes 2 (two) times daily.   gabapentin  300 MG capsule Commonly known as: NEURONTIN  Take 2 capsules (600 mg total) by mouth 3 (three) times daily.    latanoprost  0.005 % ophthalmic solution Commonly known as: XALATAN  Place 1 drop into both eyes at bedtime.   melatonin 5 MG Tabs Take 5 mg by mouth at bedtime.   Menthol (Topical Analgesic) 5 % Gel Apply 1 application  topically every 12 (twelve) hours as needed (pain).   mineral oil-hydrophilic petrolatum ointment Apply 1 Application topically See admin instructions. Apply topically to legs and feet every other day, may also apply daily as needed for dry skin.   oxybutynin  10 MG 24 hr tablet Commonly known as: DITROPAN -XL Take 10 mg by mouth daily.   potassium chloride  SA 20 MEQ tablet Commonly known as: KLOR-CON  M Take 20 mEq by mouth every other day.   Sodium Fluoride 5000 PPM 1.1 % Pste Generic drug: Sodium Fluoride Place 1 application  onto teeth every evening.   torsemide  20 MG tablet Commonly known as: DEMADEX  Take 1 tablet (20 mg total) by mouth daily as needed. What changed: reasons to take this   Vitamin D  (Ergocalciferol ) 1.25 MG (50000 UNIT) Caps capsule Commonly known as: DRISDOL  TAKE 1 CAPSULE BY MOUTH EVERY 7 DAYS What changed: additional instructions   Xarelto  20 MG Tabs tablet Generic drug: rivaroxaban  TAKE 1 TABLET(20 MG) BY MOUTH EVERY EVENING What changed: See the new instructions.        Allergies[1]  Consultations: Cardiology  Procedures/Studies: ECHOCARDIOGRAM COMPLETE Result Date: 07/24/2024    ECHOCARDIOGRAM REPORT   Patient Name:   Steven Kent Date of Exam: 07/23/2024 Medical Rec #:  [id number]          Height:       73.0 in Accession #:    [id number]         Weight:       205.5 lb Date of Birth:  [birth year]         BSA:          2.176 m Patient Age:    88 years           BP:           170/98 mmHg Patient Gender: M                  HR:           82 bpm. Exam Location:  Inpatient Procedure: 2D Echo, Cardiac Doppler and Color Doppler (Both Spectral and Color            Flow Doppler were utilized during procedure). Indications:     Elevated Troponin  History:        Patient has prior history of Echocardiogram examinations, most                 recent 12/29/2023. Risk Factors:Hypertension.  Sonographer:    Nathanel Devonshire Referring Phys: NYDIA POUR RAI IMPRESSIONS  1. Left ventricular ejection fraction, by estimation, is 65 to 70%. The left ventricle has normal function. The left ventricle has no regional wall motion abnormalities. There is moderate concentric left ventricular hypertrophy. Left ventricular diastolic parameters are consistent with Grade I diastolic dysfunction (impaired  relaxation).  2. Right ventricular systolic function is normal. The right ventricular size is normal. Tricuspid regurgitation signal is inadequate for assessing PA pressure.  3. Left atrial size was mildly dilated.  4. The mitral valve is degenerative. No evidence of mitral valve regurgitation. No evidence of mitral stenosis.  5. The aortic valve is tricuspid. There is severe calcifcation of the aortic valve. Aortic valve regurgitation is mild. Moderate aortic valve stenosis. Aortic valve area, by VTI measures 1.21 cm. Aortic valve mean gradient measures 19.0 mmHg. Aortic valve Vmax measures 2.93 m/s.  6. Aortic dilatation noted. There is mild dilatation of the aortic root, measuring 41 mm. FINDINGS  Left Ventricle: Left ventricular ejection fraction, by estimation, is 65 to 70%. The left ventricle has normal function. The left ventricle has no regional wall motion abnormalities. The left ventricular internal cavity size was normal in size. There is  moderate concentric left ventricular hypertrophy. Left ventricular diastolic parameters are consistent with Grade I diastolic dysfunction (impaired relaxation). Right Ventricle: The right ventricular size is normal. No increase in right ventricular wall thickness. Right ventricular systolic function is normal. Tricuspid regurgitation signal is inadequate for assessing PA pressure. Left Atrium: Left atrial size was mildly  dilated. Right Atrium: Right atrial size was normal in size. Pericardium: There is no evidence of pericardial effusion. Mitral Valve: The mitral valve is degenerative in appearance. Mild mitral annular calcification. No evidence of mitral valve regurgitation. No evidence of mitral valve stenosis. Tricuspid Valve: The tricuspid valve is normal in structure. Tricuspid valve regurgitation is not demonstrated. No evidence of tricuspid stenosis. Aortic Valve: The aortic valve is tricuspid. There is severe calcifcation of the aortic valve. Aortic valve regurgitation is mild. Moderate aortic stenosis is present. Aortic valve mean gradient measures 19.0 mmHg. Aortic valve peak gradient measures 34.3 mmHg. Aortic valve area, by VTI measures 1.21 cm. Pulmonic Valve: The pulmonic valve was normal in structure. Pulmonic valve regurgitation is mild. No evidence of pulmonic stenosis. Aorta: Aortic dilatation noted. There is mild dilatation of the aortic root, measuring 41 mm. Venous: The inferior vena cava was not well visualized. IAS/Shunts: No atrial level shunt detected by color flow Doppler.  LEFT VENTRICLE PLAX 2D LVIDd:         4.20 cm     Diastology LVIDs:         2.90 cm     LV e' medial:    3.15 cm/s LV PW:         1.40 cm     LV E/e' medial:  15.5 LV IVS:        1.40 cm     LV e' lateral:   6.74 cm/s LVOT diam:     2.00 cm     LV E/e' lateral: 7.3 LV SV:         70 LV SV Index:   32 LVOT Area:     3.14 cm LV IVRT:       121 msec  LV Volumes (MOD) LV vol d, MOD A2C: 70.8 ml LV vol d, MOD A4C: 66.4 ml LV vol s, MOD A2C: 42.2 ml LV vol s, MOD A4C: 29.7 ml LV SV MOD A2C:     28.6 ml LV SV MOD A4C:     66.4 ml LV SV MOD BP:      36.0 ml RIGHT VENTRICLE RV Basal diam:  2.80 cm TAPSE (M-mode): 1.6 cm LEFT ATRIUM             Index  RIGHT ATRIUM           Index LA diam:        3.60 cm 1.65 cm/m   RA Area:     12.10 cm LA Vol (A2C):   59.4 ml 27.29 ml/m  RA Volume:   24.20 ml  11.12 ml/m LA Vol (A4C):   49.6 ml 22.79  ml/m LA Biplane Vol: 55.6 ml 25.55 ml/m  AORTIC VALVE AV Area (Vmax):    1.35 cm AV Area (Vmean):   1.25 cm AV Area (VTI):     1.21 cm AV Vmax:           293.00 cm/s AV Vmean:          204.000 cm/s AV VTI:            0.575 m AV Peak Grad:      34.3 mmHg AV Mean Grad:      19.0 mmHg LVOT Vmax:         126.00 cm/s LVOT Vmean:        81.200 cm/s LVOT VTI:          0.222 m LVOT/AV VTI ratio: 0.39  AORTA Ao Root diam: 4.10 cm Ao Asc diam:  3.40 cm MITRAL VALVE MV Area (PHT): 5.27 cm     SHUNTS MV Decel Time: 144 msec     Systemic VTI:  0.22 m MV E velocity: 48.95 cm/s   Systemic Diam: 2.00 cm MV A velocity: 122.00 cm/s MV E/A ratio:  0.40 Toribio Fuel MD Electronically signed by Toribio Fuel MD Signature Date/Time: 07/24/2024/1:58:08 AM    Final    MR BRAIN WO CONTRAST Result Date: 07/22/2024 EXAM: MRI Brain Without Contrast 07/22/2024 09:50:13 PM TECHNIQUE: Multiplanar multisequence MRI of the head/brain was performed without the administration of intravenous contrast. COMPARISON: MRI Dec 12, 2020. MRI Head Dec 12, 2020. CLINICAL HISTORY: Neuro deficit, acute, stroke suspected FINDINGS: BRAIN AND VENTRICLES: No acute infarct. No intracranial hemorrhage. No mass. No midline shift. No hydrocephalus. Advanced T2 hyperintensities in the white matter, compatible with chronic microvascular ischemic change. Remote left thalamic infarct with evidence of prior hemorrhage there. Chronic lacunar infarcts in the basal ganglia and cerebllum. Small foci of sceptibility artifact in the cerebellum, thalami and cortex are compatible with prior microhemorrhages. Normal flow voids. ORBITS: No acute abnormality. 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 SINUSES AND MASTOIDS: No acute abnormality. BONES AND SOFT TISSUES: Normal marrow signal. No acute soft tissue abnormality. IMPRESSION: 1. No acute intracranial abnormality. 2. Advanced chronic microvascular ischemic change, remote left thalamic infarct, and chronic  microhemorrhages. 3. 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 Electronically signed by: Gilmore Molt 07/22/2024 10:21 PM EST RP Workstation: [id number]   DG Chest Port 1 View Result Date: 07/22/2024 CLINICAL DATA:  Altered mental status. EXAM: PORTABLE CHEST 1 VIEW COMPARISON:  Dec 24, 2023 FINDINGS: The cardiac silhouette is mildly enlarged and unchanged in size. A loop recorder device is present. There is mild prominence of the central pulmonary vasculature. Low lung volumes are noted with mild, chronic appearing increased lung markings. No focal consolidation, pleural effusion or pneumothorax is identified. Multilevel degenerative changes are seen throughout the thoracic spine. IMPRESSION: Low lung volumes with mild, chronic appearing increased lung markings. Electronically Signed   By: Suzen Dials M.D.   On: 07/22/2024 18:22   CT HEAD WO CONTRAST Result Date: 07/22/2024 CLINICAL DATA:  Neural deficit. EXAM: CT HEAD WITHOUT CONTRAST TECHNIQUE: Contiguous axial images were obtained from the base of the  skull through the vertex without intravenous contrast. RADIATION DOSE REDUCTION: This exam was performed according to the departmental dose-optimization program which includes automated exposure control, adjustment of the mA and/or kV according to patient size and/or use of iterative reconstruction technique. COMPARISON:  Dec 09, 2023 FINDINGS: Brain: There is generalized cerebral atrophy with widening of the extra-axial spaces and ventricular dilatation. There are areas of decreased attenuation within the white matter tracts of the supratentorial brain, consistent with microvascular disease changes. Chronic bilateral basal ganglia lacunar infarcts are seen. A chronic left thalamic lacunar infarct is also noted. Vascular: No hyperdense vessel or unexpected calcification. Skull: Normal. Negative for fracture or focal lesion. Sinuses/Orbits: There is mild right maxillary sinus mucosal  thickening. Other: None. IMPRESSION: 1. Generalized cerebral atrophy with chronic white matter small vessel ischemic changes. 2. Chronic bilateral basal ganglia and left thalamic lacunar infarcts. 3. No acute intracranial abnormality. Electronically Signed   By: Suzen Dials M.D.   On: 07/22/2024 14:19   CUP PACEART REMOTE DEVICE CHECK Result Date: 06/26/2024 ILR summary report received. Battery status OK. Normal device function. No new symptom, tachy, brady, or pause episodes. No new AF episodes. Monthly summary reports and ROV/PRN AB, CVRS    Subjective: No acute issues or events overnight denies nausea vomiting diarrhea constipation headache fevers chills chest pain   Discharge Exam: Vitals:   07/24/24 0339 07/24/24 0828  BP: (!) 181/89 (!) 185/102  Pulse: 80 80  Resp: 20 20  Temp: 99 F (37.2 C) 99.2 F (37.3 C)  SpO2:     Vitals:   07/23/24 1936 07/23/24 2326 07/24/24 0339 07/24/24 0828  BP: (!) 170/99 (!) 178/99 (!) 181/89 (!) 185/102  Pulse: 76 67 80 80  Resp: 20 17 20 20   Temp: 98.5 F (36.9 C) 98.7 F (37.1 C) 99 F (37.2 C) 99.2 F (37.3 C)  TempSrc: Oral Oral Oral Axillary  SpO2: 97%     Weight:      Height:        General: Pt is alert, awake, not in acute distress Cardiovascular: RRR, S1/S2 +, no rubs, no gallops Respiratory: CTA bilaterally, no wheezing, no rhonchi Abdominal: Soft, NT, ND, bowel sounds + Extremities: no edema, no cyanosis    The results of significant diagnostics from this hospitalization (including imaging, microbiology, ancillary and laboratory) are listed below for reference.     Microbiology: No results found for this or any previous visit (from the past 240 hours).   Labs: BNP (last 3 results) No results for input(s): BNP in the last 8760 hours. Basic Metabolic Panel: Recent Labs  Lab 07/22/24 1347 07/23/24 0808 07/24/24 0846  NA 139 141 138  K 4.9 4.6 3.8  CL 104 104 102  CO2 25 27 23   GLUCOSE 98 135* 95  BUN  17 18 17   CREATININE 1.42* 1.05 1.08  CALCIUM  9.3 9.4 9.0   Liver Function Tests: Recent Labs  Lab 07/22/24 1347  AST 22  ALT 9  ALKPHOS 81  BILITOT 0.9  PROT 7.1  ALBUMIN 3.6   No results for input(s): LIPASE, AMYLASE in the last 168 hours. No results for input(s): AMMONIA in the last 168 hours. CBC: Recent Labs  Lab 07/22/24 1347 07/24/24 0846  WBC 5.6 5.6  NEUTROABS 2.9  --   HGB 13.7 14.8  HCT 43.8 45.3  MCV 92.8 89.2  PLT 192 187   Cardiac Enzymes: No results for input(s): CKTOTAL, CKMB, CKMBINDEX, TROPONINI in the last 168 hours. BNP: Invalid  input(s): POCBNP CBG: Recent Labs  Lab 07/22/24 1347  GLUCAP 97   D-Dimer No results for input(s): DDIMER in the last 72 hours. Hgb A1c No results for input(s): HGBA1C in the last 72 hours. Lipid Profile No results for input(s): CHOL, HDL, LDLCALC, TRIG, CHOLHDL, LDLDIRECT in the last 72 hours. Thyroid  function studies Recent Labs    07/23/24 0808  TSH 1.560   Anemia work up No results for input(s): VITAMINB12, FOLATE, FERRITIN, TIBC, IRON, RETICCTPCT in the last 72 hours. Urinalysis    Component Value Date/Time   COLORURINE AMBER (A) 12/27/2023 1503   APPEARANCEUR HAZY (A) 12/27/2023 1503   LABSPEC 1.031 (H) 12/27/2023 1503   PHURINE 5.0 12/27/2023 1503   GLUCOSEU NEGATIVE 12/27/2023 1503   HGBUR SMALL (A) 12/27/2023 1503   BILIRUBINUR SMALL (A) 12/27/2023 1503   KETONESUR 5 (A) 12/27/2023 1503   PROTEINUR >=300 (A) 12/27/2023 1503   NITRITE NEGATIVE 12/27/2023 1503   LEUKOCYTESUR TRACE (A) 12/27/2023 1503   Sepsis Labs Recent Labs  Lab 07/22/24 1347 07/24/24 0846  WBC 5.6 5.6   Microbiology No results found for this or any previous visit (from the past 240 hours).   Time coordinating discharge: Over 30 minutes  SIGNED:   Elsie JAYSON [place], DO [facility] 07/24/2024, 12:10 PM Pager   If 7PM-7AM, please contact  night-coverage [email or web address]     [1]  Allergies Allergen Reactions   Aspirin Other (See Comments)    Full Strength Asprin-325 mg causes upset stomach. Can take 81 mg.

## 2024-07-25 ENCOUNTER — Encounter

## 2024-07-26 ENCOUNTER — Ambulatory Visit

## 2024-07-26 DIAGNOSIS — R55 Syncope and collapse: Secondary | ICD-10-CM

## 2024-07-28 LAB — CUP PACEART REMOTE DEVICE CHECK
Date Time Interrogation Session: 20251225230644
Implantable Pulse Generator Implant Date: 20211027

## 2024-07-29 NOTE — Progress Notes (Signed)
 Remote Loop Recorder Transmission

## 2024-07-31 ENCOUNTER — Ambulatory Visit: Payer: Self-pay | Admitting: Cardiovascular Disease

## 2024-08-26 ENCOUNTER — Ambulatory Visit

## 2024-08-26 ENCOUNTER — Encounter

## 2024-08-26 DIAGNOSIS — R55 Syncope and collapse: Secondary | ICD-10-CM

## 2024-08-26 LAB — CUP PACEART REMOTE DEVICE CHECK
Date Time Interrogation Session: 20260125230521
Implantable Pulse Generator Implant Date: 20211027

## 2024-08-27 ENCOUNTER — Ambulatory Visit: Payer: Self-pay | Admitting: Cardiovascular Disease

## 2024-09-03 NOTE — Progress Notes (Signed)
 Remote Loop Recorder Transmission

## 2024-09-06 ENCOUNTER — Emergency Department (HOSPITAL_COMMUNITY)
Admission: EM | Admit: 2024-09-06 | Discharge: 2024-09-06 | Disposition: A | Discharge status: Home / Self Care | Source: Home / Self Care | Attending: Emergency Medicine | Admitting: Emergency Medicine

## 2024-09-06 ENCOUNTER — Emergency Department (HOSPITAL_COMMUNITY)

## 2024-09-06 DIAGNOSIS — I7143 Infrarenal abdominal aortic aneurysm, without rupture: Secondary | ICD-10-CM

## 2024-09-06 DIAGNOSIS — W19XXXA Unspecified fall, initial encounter: Secondary | ICD-10-CM

## 2024-09-06 LAB — SAMPLE TO BLOOD BANK

## 2024-09-06 LAB — COMPREHENSIVE METABOLIC PANEL WITH GFR
ALT: 11 U/L (ref 0–44)
AST: 25 U/L (ref 15–41)
Albumin: 3.3 g/dL — ABNORMAL LOW (ref 3.5–5.0)
Alkaline Phosphatase: 74 U/L (ref 38–126)
Anion gap: 9 (ref 5–15)
BUN: 20 mg/dL (ref 8–23)
CO2: 26 mmol/L (ref 22–32)
Calcium: 8.7 mg/dL — ABNORMAL LOW (ref 8.9–10.3)
Chloride: 104 mmol/L (ref 98–111)
Creatinine, Ser: 1.38 mg/dL — ABNORMAL HIGH (ref 0.61–1.24)
GFR, Estimated: 49 mL/min — ABNORMAL LOW
Glucose, Bld: 85 mg/dL (ref 70–99)
Potassium: 3.9 mmol/L (ref 3.5–5.1)
Sodium: 139 mmol/L (ref 135–145)
Total Bilirubin: 0.8 mg/dL (ref 0.0–1.2)
Total Protein: 6.9 g/dL (ref 6.5–8.1)

## 2024-09-06 LAB — CBC
HCT: 42.5 % (ref 39.0–52.0)
Hemoglobin: 13.8 g/dL (ref 13.0–17.0)
MCH: 30.9 pg (ref 26.0–34.0)
MCHC: 32.5 g/dL (ref 30.0–36.0)
MCV: 95.3 fL (ref 80.0–100.0)
Platelets: 197 10*3/uL (ref 150–400)
RBC: 4.46 MIL/uL (ref 4.22–5.81)
RDW: 17.9 % — ABNORMAL HIGH (ref 11.5–15.5)
WBC: 6.6 10*3/uL (ref 4.0–10.5)
nRBC: 0 % (ref 0.0–0.2)

## 2024-09-06 LAB — URINALYSIS, ROUTINE W REFLEX MICROSCOPIC
Bacteria, UA: NONE SEEN
Bilirubin Urine: NEGATIVE
Glucose, UA: 150 mg/dL — AB
Ketones, ur: NEGATIVE mg/dL
Leukocytes,Ua: NEGATIVE
Nitrite: NEGATIVE
Protein, ur: 30 mg/dL — AB
Specific Gravity, Urine: 1.032 — ABNORMAL HIGH (ref 1.005–1.030)
pH: 5 (ref 5.0–8.0)

## 2024-09-06 LAB — I-STAT CHEM 8, ED
BUN: 22 mg/dL (ref 8–23)
Calcium, Ion: 1.05 mmol/L — ABNORMAL LOW (ref 1.15–1.40)
Chloride: 103 mmol/L (ref 98–111)
Creatinine, Ser: 1.5 mg/dL — ABNORMAL HIGH (ref 0.61–1.24)
Glucose, Bld: 84 mg/dL (ref 70–99)
HCT: 43 % (ref 39.0–52.0)
Hemoglobin: 14.6 g/dL (ref 13.0–17.0)
Potassium: 3.6 mmol/L (ref 3.5–5.1)
Sodium: 141 mmol/L (ref 135–145)
TCO2: 23 mmol/L (ref 22–32)

## 2024-09-06 LAB — I-STAT CG4 LACTIC ACID, ED: Lactic Acid, Venous: 1.8 mmol/L (ref 0.5–1.9)

## 2024-09-06 LAB — CBG MONITORING, ED: Glucose-Capillary: 79 mg/dL (ref 70–99)

## 2024-09-06 LAB — PROTIME-INR
INR: 1.8 — ABNORMAL HIGH (ref 0.8–1.2)
Prothrombin Time: 22.3 s — ABNORMAL HIGH (ref 11.4–15.2)

## 2024-09-06 LAB — ETHANOL: Alcohol, Ethyl (B): <15 mg/dL

## 2024-09-06 LAB — TROPONIN T, HIGH SENSITIVITY
Troponin T High Sensitivity: 92 ng/L — ABNORMAL HIGH (ref 0–19)
Troponin T High Sensitivity: 77 ng/L — ABNORMAL HIGH (ref 0–19)

## 2024-09-06 MED ORDER — IOHEXOL 350 MG/ML SOLN
100.0000 mL | Freq: Once | INTRAVENOUS | Status: AC | PRN
  Administered 2024-09-06: 100 mL via INTRAVENOUS

## 2024-09-06 MED ORDER — IOHEXOL 350 MG/ML SOLN
25.0000 mL | Freq: Once | INTRAVENOUS | Status: AC | PRN
  Administered 2024-09-06: 25 mL via INTRAVENOUS

## 2024-09-06 MED ORDER — SODIUM CHLORIDE 0.9 % IV BOLUS
1000.0000 mL | Freq: Once | INTRAVENOUS | Status: AC
  Administered 2024-09-06: 1000 mL via INTRAVENOUS

## 2024-09-06 NOTE — ED Provider Notes (Signed)
 " [facility] EMERGENCY DEPARTMENT AT [facility] Provider Note   CSN: [id number] Arrival date & time: 09/06/24  1143     Patient presents with: Steven Kent is a 89 y.o. male.   The history is provided by medical records, the EMS personnel and the spouse.  Fall   89 year old male, DNR presents after being found on the floor at his facility.  His baseline is that he can stand for transfers and can speak and is alert but somewhat confused.  He completed rehab this morning and not too long after that was found lying on the floor.  His GCS was 10 when EMS picked him up.  Glucose and vital signs were normal.  His mental status started to improve as he arrived to the ED.  Has baseline right hemiparesis.  Per wife patient was taken off dementia medicine for low heart rate in December. At baseline he can carry on a conversation but is forgetful. Nonambulatory. Has been increasingly forgetful.    Prior to Admission medications  Medication Sig Start Date End Date Taking? Authorizing Provider  acetaminophen  (TYLENOL ) 500 MG tablet Take 1,000 mg by mouth 2 (two) times daily.    [provider]  atorvastatin  (LIPITOR) 40 MG tablet TAKE 1 TABLET(40 MG) BY MOUTH EVERY EVENING Patient taking differently: Take 40 mg by mouth daily. 09/07/20   Kennyth Worth HERO, MD  baclofen  (LIORESAL ) 10 MG tablet Take 1 tablet (10 mg total) by mouth 2 (two) times daily. 12/12/23   Gherghe, Costin M, MD  brimonidine  (ALPHAGAN ) 0.15 % ophthalmic solution Place 1 drop into both eyes 2 (two) times daily.    [provider]  chlorhexidine (PERIDEX) 0.12 % solution 10 mLs by Mouth Rinse route every Monday, Wednesday, and Friday. Rinse and spit.    [provider]  dorzolamide -timolol  (COSOPT ) 2-0.5 % ophthalmic solution Place 1 drop into both eyes 2 (two) times daily.    [provider]  gabapentin  (NEURONTIN ) 300 MG capsule Take 2 capsules (600 mg total) by mouth 3  (three) times daily. 12/29/23   Perri DELENA Meliton Mickey., MD  latanoprost  (XALATAN ) 0.005 % ophthalmic solution Place 1 drop into both eyes at bedtime.    [provider]  melatonin 5 MG TABS Take 5 mg by mouth at bedtime.    [provider]  Menthol, Topical Analgesic, 5 % GEL Apply 1 application  topically every 12 (twelve) hours as needed (pain).    [provider]  mineral oil-hydrophilic petrolatum (AQUAPHOR) ointment Apply 1 Application topically See admin instructions. Apply topically to legs and feet every other day, may also apply daily as needed for dry skin.    [provider]  oxybutynin  (DITROPAN -XL) 10 MG 24 hr tablet Take 10 mg by mouth daily.    [provider]  potassium chloride  SA (KLOR-CON  M) 20 MEQ tablet Take 20 mEq by mouth every other day.    [provider]  SODIUM FLUORIDE 5000 PPM 1.1 % PSTE Place 1 application  onto teeth every evening. 05/21/24   [provider]  torsemide  (DEMADEX ) 20 MG tablet Take 1 tablet (20 mg total) by mouth daily as needed. Patient taking differently: Take 20 mg by mouth daily as needed (edema). 12/12/23   Gherghe, Costin M, MD  Vitamin D , Ergocalciferol , (DRISDOL ) 1.25 MG (50000 UNIT) CAPS capsule TAKE 1 CAPSULE BY MOUTH EVERY 7 DAYS Patient taking differently: Take 50,000 Units by mouth every 7 (seven) days.  Sunday 12/21/20   Kennyth Worth HERO, MD  XARELTO  20 MG TABS tablet TAKE 1 TABLET(20 MG) BY MOUTH EVERY EVENING Patient taking differently: Take 20 mg by mouth every evening. 09/07/20   Kennyth Worth HERO, MD    Allergies: Aspirin    Review of Systems Unable to obtain Updated Vital Signs BP 127/71 (BP Location: Right Arm)   Pulse 75   Temp 97.8 F (36.6 C) (Axillary)   Resp 19   Ht 6' (1.829 m)   Wt 90.7 kg   SpO2 100%   BMI 27.12 kg/m   Physical Exam Vitals and nursing note reviewed.  Constitutional:      General: He is not in acute distress.    Appearance: He is  well-developed.     Comments: Elderly male, lying on stretcher, moaning, eyes closed.  HENT:     Head: Normocephalic and atraumatic.  Eyes:     Comments: Right lens opacification and fixed pupil at 6 mm.  Left pupil reactive at 2 mm.  Neck:     Comments: No midline tenderness.  Collar placed. Cardiovascular:     Rate and Rhythm: Normal rate and regular rhythm.     Heart sounds: No murmur heard. Pulmonary:     Effort: Pulmonary effort is normal. No respiratory distress.     Breath sounds: Normal breath sounds.  Abdominal:     Palpations: Abdomen is soft.     Tenderness: There is no abdominal tenderness.  Musculoskeletal:        General: No swelling.     Cervical back: Neck supple.     Comments: No obvious deformity or swelling or tenderness.  No midline back tenderness.  Able to range extremities without pain though exam limited by mental status.  Skin:    General: Skin is warm and dry.     Capillary Refill: Capillary refill takes less than 2 seconds.  Neurological:     Comments: Eyes closed, opens eyes to voice, moans, localizes to pain.  GCS 10.  Baseline right hemiparesis.  Psychiatric:        Mood and Affect: Mood normal.     (all labs ordered are listed, but only abnormal results are displayed) Labs Reviewed  COMPREHENSIVE METABOLIC PANEL WITH GFR - Abnormal; Notable for the following components:      Result Value   Creatinine, Ser 1.38 (*)    Calcium  8.7 (*)    Albumin 3.3 (*)    GFR, Estimated 49 (*)    All other components within normal limits  CBC - Abnormal; Notable for the following components:   RDW 17.9 (*)    All other components within normal limits  URINALYSIS, ROUTINE W REFLEX MICROSCOPIC - Abnormal; Notable for the following components:   Color, Urine AMBER (*)    APPearance HAZY (*)    Specific Gravity, Urine 1.032 (*)    Glucose, UA 150 (*)    Hgb urine dipstick MODERATE (*)    Protein, ur 30 (*)    All other components within normal limits   PROTIME-INR - Abnormal; Notable for the following components:   Prothrombin Time 22.3 (*)    INR 1.8 (*)    All other components within normal limits  I-STAT CHEM 8, ED - Abnormal; Notable for the following components:   Creatinine, Ser 1.50 (*)    Calcium , Ion 1.05 (*)    All other components within normal limits  TROPONIN T, HIGH SENSITIVITY - Abnormal; Notable for the following components:  Troponin T High Sensitivity 92 (*)    All other components within normal limits  TROPONIN T, HIGH SENSITIVITY - Abnormal; Notable for the following components:   Troponin T High Sensitivity 77 (*)    All other components within normal limits  ETHANOL  CBG MONITORING, ED  I-STAT CG4 LACTIC ACID, ED  I-STAT CHEM 8, ED  I-STAT CG4 LACTIC ACID, ED  SAMPLE TO BLOOD BANK    EKG: EKG Interpretation Date/Time:  Friday September 06 2024 11:57:40 EST Ventricular Rate:  71 PR Interval:  276 QRS Duration:  106 QT Interval:  422 QTC Calculation: 459 R Axis:   -29  Text Interpretation: Sinus rhythm Prolonged PR interval Probable left ventricular hypertrophy Nonspecific T abnrm, anterolateral leads unchanged Confirmed by Fredia Kaufman [phone]) on 09/06/2024 2:32:16 PM  Radiology: CT CHEST ABDOMEN PELVIS W CONTRAST Result Date: 09/06/2024 EXAM: CT CHEST, ABDOMEN AND PELVIS WITH CONTRAST 09/06/2024 12:20:00 PM TECHNIQUE: CT of the chest, abdomen and pelvis was performed with the administration of 100 mL iohexol  (OMNIPAQUE ) 350 MG/ML injection. Multiplanar reformatted images are provided for review. Automated exposure control, iterative reconstruction, and/or weight based adjustment of the mA/kV was utilized to reduce the radiation dose to as low as reasonably achievable. COMPARISON: CT abdomen 12/26/2023, chest radiograph 09/23/2023. CLINICAL HISTORY: Unwitnessed fall, blunt chest trauma. History of stroke with right hemiplegia. FINDINGS: CHEST: MEDIASTINUM AND LYMPH NODES: Heart and pericardium are  unremarkable. Borderline cardiomegaly. Aortic and mitral valve calcifications. The central airways are clear. No mediastinal, hilar or axillary lymphadenopathy. Thoracic aortic, coronary artery, and branch vessel atheromatous vascular calcifications. LUNGS AND PLEURA: Densely calcified benign granuloma of the lingula. Mild atelectasis in the posterior basal segment left lower lobe along with mild airway plugging in the left lower lobe. The previously noncalcified left upper lobe nodule shown on 12/26/2023 has resolved. No pleural effusion. No pneumothorax. Motion artifact is present, reducing diagnostic sensitivity and specificity. The contrast bolus is relatively dilute. ABDOMEN AND PELVIS: LIVER: Unremarkable. GALLBLADDER AND BILE DUCTS: Unremarkable. No biliary ductal dilatation. SPLEEN: No acute abnormality. PANCREAS: No acute abnormality. ADRENAL GLANDS: No acute abnormality. KIDNEYS, URETERS AND BLADDER: Bilateral hypodense renal lesions are compatible with benign cysts and warrant no further imaging workup. Per consensus, no follow-up is needed for simple Bosniak type 1 and 2 renal cysts, unless the patient has a malignancy history or risk factors. No stones in the kidneys or ureters. No hydronephrosis. No perinephric or periureteral stranding. Urinary bladder is unremarkable. GI AND BOWEL: Stomach demonstrates no acute abnormality. Circumferential constipation prominence of stool in the rectal vault potentially reflecting fecal impaction. There is no bowel obstruction. REPRODUCTIVE ORGANS: Prostatomegaly. PERITONEUM AND RETROPERITONEUM: No ascites. No free air. VASCULATURE: Infrarenal abdominal aortic aneurysm with some right eccentric mural thrombus, 5.7 cm in diameter on image 53 series 9. According to ACR 2013 surveillance intervals for abdominal aortic aneurysm, the finding is consistent with an AAA >5.5 cm and surgical consultation for a potential repair is recommended. This extends down to the  bifurcation. Systemic atherosclerosis is present, including the aorta and iliac arteries. Atheromatous plaque at the origin of the celiac trunk and superior mesenteric artery without overt occlusion. ABDOMINAL AND PELVIS LYMPH NODES: No lymphadenopathy. BONES AND SOFT TISSUES: Degenerative glenohumeral arthropathy, right greater than left. Thoracic spondylosis. Lumbar spondylosis and degenerative disc disease. Gynecomastia noted. No acute osseous abnormality. No focal soft tissue abnormality. IMPRESSION: 1. No evidence of acute traumatic injury. 2. Infrarenal abdominal aortic aneurysm with right eccentric mural thrombus measuring 5.7 cm, extending  to the bifurcation; vascular specialist referral for consideration of endovascular repair or open repair recommended. 3. Systemic atherosclerosis, including aortic and iliac atherosclerosis, with atheromatous plaque at the origin of the celiac trunk and superior mesenteric artery without overt occlusion. 4. Borderline cardiomegaly with aortic and mitral valve calcifications. 5. Prominent stool in the rectal vault, which could reflect fecal impaction. 6. Prostatomegaly. 7. Gynecomastia. 8. Degenerative glenohumeral arthropathy, right greater than left. 9. Thoracic spondylosis. 10. Lumbar spondylosis and degenerative disc disease. Electronically signed by: Ryan Salvage MD 09/06/2024 01:18 PM EST RP Workstation: [id number]   CT HEAD WO CONTRAST Result Date: 09/06/2024 EXAM: CT HEAD AND CERVICAL SPINE 09/06/2024 12:20:00 PM TECHNIQUE: CT of the head and cervical spine was performed without the administration of intravenous contrast. Multiplanar reformatted images are provided for review. Automated exposure control, iterative reconstruction, and/or weight based adjustment of the mA/kV was utilized to reduce the radiation dose to as low as reasonably achievable. COMPARISON: None available. CLINICAL HISTORY: Head trauma, moderate-severe FINDINGS: CT HEAD BRAIN AND  VENTRICLES: No acute intracranial hemorrhage. No mass effect or midline shift. No abnormal extra-axial fluid collection. No evidence of acute infarct. No hydrocephalus. Moderate patchy white matter hypodensities are compatible with small vessel ischemic disease. Remote left thalamic lacunar infarct. ORBITS: No acute abnormality. SINUSES AND MASTOIDS: Moderate right maxillary sinus mucosal thickening. SOFT TISSUES AND SKULL: No acute skull fracture. No acute soft tissue abnormality. CT CERVICAL SPINE BONES AND ALIGNMENT: No acute fracture. No sagittal subluxation. Rotation of C1 on C2. DEGENERATIVE CHANGES: Moderate degenerative changes including degenerative disc disease with anterior bridging osteophytes and facets/uncovertebral hypertrophy with varying degrees of neural foraminal stenosis. SOFT TISSUES: No prevertebral soft tissue swelling. IMPRESSION: 1. No acute intracranial abnormality. 2. No acute fracture in the cervical spine. 3. Rotation of C1 on C2, most likely positional in the absence of a fixed torticollis. Electronically signed by: Glendia Molt MD 09/06/2024 12:41 PM EST RP Workstation: [id number]   CT CERVICAL SPINE WO CONTRAST Result Date: 09/06/2024 EXAM: CT HEAD AND CERVICAL SPINE 09/06/2024 12:20:00 PM TECHNIQUE: CT of the head and cervical spine was performed without the administration of intravenous contrast. Multiplanar reformatted images are provided for review. Automated exposure control, iterative reconstruction, and/or weight based adjustment of the mA/kV was utilized to reduce the radiation dose to as low as reasonably achievable. COMPARISON: None available. CLINICAL HISTORY: Head trauma, moderate-severe FINDINGS: CT HEAD BRAIN AND VENTRICLES: No acute intracranial hemorrhage. No mass effect or midline shift. No abnormal extra-axial fluid collection. No evidence of acute infarct. No hydrocephalus. Moderate patchy white matter hypodensities are compatible with small vessel ischemic  disease. Remote left thalamic lacunar infarct. ORBITS: No acute abnormality. SINUSES AND MASTOIDS: Moderate right maxillary sinus mucosal thickening. SOFT TISSUES AND SKULL: No acute skull fracture. No acute soft tissue abnormality. CT CERVICAL SPINE BONES AND ALIGNMENT: No acute fracture. No sagittal subluxation. Rotation of C1 on C2. DEGENERATIVE CHANGES: Moderate degenerative changes including degenerative disc disease with anterior bridging osteophytes and facets/uncovertebral hypertrophy with varying degrees of neural foraminal stenosis. SOFT TISSUES: No prevertebral soft tissue swelling. IMPRESSION: 1. No acute intracranial abnormality. 2. No acute fracture in the cervical spine. 3. Rotation of C1 on C2, most likely positional in the absence of a fixed torticollis. Electronically signed by: Glendia Molt MD 09/06/2024 12:41 PM EST RP Workstation: [id number]     Procedures   Medications Ordered in the ED  iohexol  (OMNIPAQUE ) 350 MG/ML injection 25 mL (25 mLs Intravenous Contrast Given 09/06/24 1217)  iohexol  (OMNIPAQUE ) 350  MG/ML injection 100 mL (100 mLs Intravenous Contrast Given 09/06/24 1224)  sodium chloride  0.9 % bolus 1,000 mL (1,000 mLs Intravenous New Bag/Given 09/06/24 1431)    Clinical Course as of 09/06/24 1556  Fri Sep 06, 2024  1552 Labs show slightly elevated creatinine 1.38.  INR is 1.8.  Urine with a small amount of blood but no acute pathology on CT. [AJ]    Clinical Course User Index [AJ] Camaya Gannett, Rosette Kirsch, MD                                 Medical Decision Making Amount and/or Complexity of Data Reviewed Independent Historian: spouse and EMS External Data Reviewed: labs, radiology and notes. Labs: ordered. Radiology: ordered.  Risk Prescription drug management.   89 year old male, DNR presents from facility after being found on the ground.  Was initially quite confused with a GCS of 10.  Results here are reassuring.  He is now alert and conversant.  Lengthy  discussion with wife and it seems he is now at his baseline.  No evidence of acute traumatic injury.  Has some blood noted in urine but no acute injury on CT.  No abdominal tenderness or pulsatile mass to suspect acute issue with AAA.  Intact distal pulses.  Will refer to vascular outpatient for this.  Patient is alert, smiling.  Vital signs stable.All results reviewed with patient.  All questions answered.  Careful return precautions have been given.  Close follow-up advised. Patient is happy with plan and requests discharge.      Final diagnoses:  Fall, initial encounter  Infrarenal abdominal aortic aneurysm (AAA) without rupture    ED Discharge Orders     None          Fredia Rosette Kirsch, MD 09/06/24 1557  "

## 2024-09-06 NOTE — Progress Notes (Signed)
 Orthopedic Tech Progress Note Patient Details:  Steven Kent Sep 11, 1934 [id number] Level 2 Trauma. Not needed Patient ID: Steven Kent, male   DOB: [birth year], 89 y.o.   MRN: [id number]  Steven Kent Cos 09/06/2024, 12:26 PM

## 2024-09-06 NOTE — Discharge Instructions (Addendum)
 You were seen in the emergency department after being found on the ground.  Your evaluation is reassuring.  It looks like you could increase your water intake a bit.  You had some blood in your urine and your doctor should recheck this in the next few days.  You are also noted to have a abdominal aortic aneurysm and I am referring you to the vascular clinic for an evaluation.  If you worsen in any way, please be reevaluated immediately.

## 2024-09-06 NOTE — ED Triage Notes (Signed)
 BIB EMS from [facility] r/t unwitnessed fall today. Facility report finding patient on ground after leaving room for 10 minutes. EMS reports patient in on warfarin. GCS 10. C-collar placed on arrive. Hx  stroke with right side hemiplegia.

## 2024-09-26 ENCOUNTER — Encounter

## 2024-09-26 ENCOUNTER — Ambulatory Visit

## 2024-10-27 ENCOUNTER — Ambulatory Visit

## 2024-10-28 ENCOUNTER — Encounter

## 2024-11-27 ENCOUNTER — Ambulatory Visit

## 2024-11-28 ENCOUNTER — Encounter

## 2024-12-28 ENCOUNTER — Ambulatory Visit

## 2024-12-30 ENCOUNTER — Encounter

## 2025-01-28 ENCOUNTER — Ambulatory Visit

## 2025-01-30 ENCOUNTER — Encounter

## 2025-02-28 ENCOUNTER — Ambulatory Visit

## 2025-03-31 ENCOUNTER — Ambulatory Visit

## 2025-05-01 ENCOUNTER — Ambulatory Visit

## 2025-06-01 ENCOUNTER — Ambulatory Visit

## 2025-07-02 ENCOUNTER — Ambulatory Visit

## 2025-08-02 ENCOUNTER — Ambulatory Visit
